# Patient Record
Sex: Male | Born: 1935 | Race: White | Hispanic: No | Marital: Married | Smoking: Current every day smoker
Health system: Southern US, Community
[De-identification: ages and names within clinical notes are randomized; demographics above are authoritative.]

## PROBLEM LIST (undated history)

## (undated) DIAGNOSIS — N186 End stage renal disease: Secondary | ICD-10-CM

## (undated) DIAGNOSIS — I10 Essential (primary) hypertension: Secondary | ICD-10-CM

## (undated) DIAGNOSIS — E785 Hyperlipidemia, unspecified: Secondary | ICD-10-CM

## (undated) DIAGNOSIS — N400 Enlarged prostate without lower urinary tract symptoms: Secondary | ICD-10-CM

## (undated) DIAGNOSIS — J449 Chronic obstructive pulmonary disease, unspecified: Secondary | ICD-10-CM

## (undated) DIAGNOSIS — I5032 Chronic diastolic (congestive) heart failure: Secondary | ICD-10-CM

## (undated) DIAGNOSIS — E119 Type 2 diabetes mellitus without complications: Secondary | ICD-10-CM

## (undated) HISTORY — DX: Chronic obstructive pulmonary disease, unspecified: J44.9

## (undated) HISTORY — PX: CYSTOSCOPY: SUR368

## (undated) HISTORY — DX: Essential (primary) hypertension: I10

## (undated) HISTORY — DX: Chronic diastolic (congestive) heart failure: I50.32

## (undated) HISTORY — PX: KNEE ARTHROPLASTY: SHX992

## (undated) HISTORY — DX: Hyperlipidemia, unspecified: E78.5

## (undated) HISTORY — PX: ABDOMINAL AORTIC ENDOVASCULAR STENT GRAFT: SHX5707

## (undated) HISTORY — DX: Type 2 diabetes mellitus without complications: E11.9

## (undated) HISTORY — DX: Benign prostatic hyperplasia without lower urinary tract symptoms: N40.0

---

## 2010-09-09 DIAGNOSIS — M1990 Unspecified osteoarthritis, unspecified site: Secondary | ICD-10-CM | POA: Insufficient documentation

## 2010-10-07 DIAGNOSIS — I729 Aneurysm of unspecified site: Secondary | ICD-10-CM | POA: Insufficient documentation

## 2011-09-21 DIAGNOSIS — E119 Type 2 diabetes mellitus without complications: Secondary | ICD-10-CM | POA: Insufficient documentation

## 2012-06-12 ENCOUNTER — Inpatient Hospital Stay: Payer: Self-pay | Admitting: Internal Medicine

## 2012-06-12 DIAGNOSIS — I059 Rheumatic mitral valve disease, unspecified: Secondary | ICD-10-CM

## 2012-06-12 LAB — COMPREHENSIVE METABOLIC PANEL
Anion Gap: 3 — ABNORMAL LOW (ref 7–16)
Calcium, Total: 9.9 mg/dL (ref 8.5–10.1)
Co2: 31 mmol/L (ref 21–32)
EGFR (African American): 50 — ABNORMAL LOW
Glucose: 102 mg/dL — ABNORMAL HIGH (ref 65–99)
Sodium: 139 mmol/L (ref 136–145)
Total Protein: 7.1 g/dL (ref 6.4–8.2)

## 2012-06-12 LAB — CBC
HCT: 27.5 % — ABNORMAL LOW (ref 40.0–52.0)
HGB: 8.9 g/dL — ABNORMAL LOW (ref 13.0–18.0)
MCH: 23.8 pg — ABNORMAL LOW (ref 26.0–34.0)
Platelet: 170 10*3/uL (ref 150–440)
RBC: 3.72 10*6/uL — ABNORMAL LOW (ref 4.40–5.90)
RDW: 17.9 % — ABNORMAL HIGH (ref 11.5–14.5)
WBC: 8.2 10*3/uL (ref 3.8–10.6)

## 2012-06-12 LAB — PRO B NATRIURETIC PEPTIDE: B-Type Natriuretic Peptide: 1156 pg/mL — ABNORMAL HIGH (ref 0–125)

## 2012-06-12 LAB — CK TOTAL AND CKMB (NOT AT ARMC): CK, Total: 116 U/L (ref 35–232)

## 2012-06-12 LAB — CK-MB: CK-MB: 2.6 ng/mL (ref 0.5–3.6)

## 2012-06-13 LAB — BASIC METABOLIC PANEL
Anion Gap: 7 (ref 7–16)
BUN: 35 mg/dL — ABNORMAL HIGH (ref 7–18)
Co2: 32 mmol/L (ref 21–32)
Creatinine: 1.58 mg/dL — ABNORMAL HIGH (ref 0.60–1.30)
EGFR (African American): 49 — ABNORMAL LOW
Osmolality: 289 (ref 275–301)
Potassium: 3.4 mmol/L — ABNORMAL LOW (ref 3.5–5.1)

## 2012-06-13 LAB — CBC WITH DIFFERENTIAL/PLATELET
Eosinophil #: 0.2 10*3/uL (ref 0.0–0.7)
Lymphocyte #: 1.2 10*3/uL (ref 1.0–3.6)
MCH: 23.8 pg — ABNORMAL LOW (ref 26.0–34.0)
MCV: 73 fL — ABNORMAL LOW (ref 80–100)
Monocyte #: 0.9 x10 3/mm (ref 0.2–1.0)
Neutrophil %: 72.9 %
Platelet: 175 10*3/uL (ref 150–440)
RBC: 3.91 10*6/uL — ABNORMAL LOW (ref 4.40–5.90)
RDW: 18 % — ABNORMAL HIGH (ref 11.5–14.5)

## 2012-06-14 LAB — IRON AND TIBC: Iron: 31 ug/dL — ABNORMAL LOW (ref 65–175)

## 2012-06-14 LAB — MAGNESIUM: Magnesium: 1.8 mg/dL

## 2012-06-14 LAB — FOLATE: Folic Acid: 8.5 ng/mL (ref 3.1–100.0)

## 2012-06-15 LAB — CBC WITH DIFFERENTIAL/PLATELET
Basophil #: 0 10*3/uL (ref 0.0–0.1)
Basophil %: 0.5 %
Eosinophil %: 1 %
HCT: 28.9 % — ABNORMAL LOW (ref 40.0–52.0)
HGB: 9.5 g/dL — ABNORMAL LOW (ref 13.0–18.0)
Lymphocyte #: 0.7 10*3/uL — ABNORMAL LOW (ref 1.0–3.6)
Lymphocyte %: 8.5 %
MCH: 23.9 pg — ABNORMAL LOW (ref 26.0–34.0)
MCHC: 32.8 g/dL (ref 32.0–36.0)
MCV: 73 fL — ABNORMAL LOW (ref 80–100)
Monocyte #: 0.6 x10 3/mm (ref 0.2–1.0)
Monocyte %: 7.5 %
Neutrophil %: 82.5 %
Platelet: 181 10*3/uL (ref 150–440)

## 2012-06-15 LAB — BASIC METABOLIC PANEL
Anion Gap: 7 (ref 7–16)
Chloride: 97 mmol/L — ABNORMAL LOW (ref 98–107)
Creatinine: 1.92 mg/dL — ABNORMAL HIGH (ref 0.60–1.30)
EGFR (Non-African Amer.): 34 — ABNORMAL LOW
Potassium: 3.5 mmol/L (ref 3.5–5.1)

## 2012-06-16 LAB — BASIC METABOLIC PANEL
Chloride: 95 mmol/L — ABNORMAL LOW (ref 98–107)
Co2: 31 mmol/L (ref 21–32)
Creatinine: 1.99 mg/dL — ABNORMAL HIGH (ref 0.60–1.30)
EGFR (African American): 37 — ABNORMAL LOW
EGFR (Non-African Amer.): 32 — ABNORMAL LOW
Glucose: 179 mg/dL — ABNORMAL HIGH (ref 65–99)
Osmolality: 281 (ref 275–301)

## 2012-06-16 LAB — CBC WITH DIFFERENTIAL/PLATELET
Basophil #: 0.1 10*3/uL (ref 0.0–0.1)
Basophil %: 1 %
Eosinophil #: 0.2 10*3/uL (ref 0.0–0.7)
HGB: 9.8 g/dL — ABNORMAL LOW (ref 13.0–18.0)
Lymphocyte #: 1.3 10*3/uL (ref 1.0–3.6)
Lymphocyte %: 16.8 %
MCH: 23.8 pg — ABNORMAL LOW (ref 26.0–34.0)
MCHC: 32.3 g/dL (ref 32.0–36.0)
MCV: 74 fL — ABNORMAL LOW (ref 80–100)
Monocyte %: 11.4 %
Neutrophil #: 5.1 10*3/uL (ref 1.4–6.5)
Neutrophil %: 67.6 %
Platelet: 187 10*3/uL (ref 150–440)
RBC: 4.12 10*6/uL — ABNORMAL LOW (ref 4.40–5.90)
WBC: 7.6 10*3/uL (ref 3.8–10.6)

## 2012-06-17 LAB — CBC WITH DIFFERENTIAL/PLATELET
Basophil %: 1.5 %
Eosinophil #: 0.3 10*3/uL (ref 0.0–0.7)
HCT: 29.8 % — ABNORMAL LOW (ref 40.0–52.0)
HGB: 9.7 g/dL — ABNORMAL LOW (ref 13.0–18.0)
Lymphocyte #: 1.6 10*3/uL (ref 1.0–3.6)
Lymphocyte %: 23.9 %
MCH: 23.8 pg — ABNORMAL LOW (ref 26.0–34.0)
MCHC: 32.7 g/dL (ref 32.0–36.0)
MCV: 73 fL — ABNORMAL LOW (ref 80–100)
Monocyte #: 0.8 x10 3/mm (ref 0.2–1.0)
Neutrophil #: 3.8 10*3/uL (ref 1.4–6.5)
RDW: 17.8 % — ABNORMAL HIGH (ref 11.5–14.5)
WBC: 6.5 10*3/uL (ref 3.8–10.6)

## 2012-06-17 LAB — BASIC METABOLIC PANEL
Anion Gap: 7 (ref 7–16)
Creatinine: 1.61 mg/dL — ABNORMAL HIGH (ref 0.60–1.30)
Osmolality: 282 (ref 275–301)
Sodium: 136 mmol/L (ref 136–145)

## 2012-06-17 LAB — MAGNESIUM: Magnesium: 2.5 mg/dL — ABNORMAL HIGH

## 2012-06-24 ENCOUNTER — Encounter: Payer: Self-pay | Admitting: *Deleted

## 2012-06-25 ENCOUNTER — Ambulatory Visit (INDEPENDENT_AMBULATORY_CARE_PROVIDER_SITE_OTHER): Payer: Medicare Other | Admitting: Cardiovascular Disease

## 2012-06-25 ENCOUNTER — Encounter: Payer: Self-pay | Admitting: Cardiovascular Disease

## 2012-06-25 VITALS — BP 100/50 | HR 51 | Ht 68.0 in | Wt 210.0 lb

## 2012-06-25 DIAGNOSIS — I739 Peripheral vascular disease, unspecified: Secondary | ICD-10-CM

## 2012-06-25 DIAGNOSIS — I509 Heart failure, unspecified: Secondary | ICD-10-CM

## 2012-06-25 DIAGNOSIS — I10 Essential (primary) hypertension: Secondary | ICD-10-CM

## 2012-06-25 DIAGNOSIS — I5032 Chronic diastolic (congestive) heart failure: Secondary | ICD-10-CM

## 2012-06-25 DIAGNOSIS — R0602 Shortness of breath: Secondary | ICD-10-CM

## 2012-06-25 MED ORDER — AMLODIPINE BESYLATE 10 MG PO TABS: 5.0000 mg | ORAL_TABLET | Freq: Every day | ORAL | Status: DC

## 2012-06-25 MED ORDER — CARVEDILOL 25 MG PO TABS: 12.5000 mg | ORAL_TABLET | Freq: Two times a day (BID) | ORAL | Status: DC

## 2012-06-25 NOTE — Assessment & Plan Note (Signed)
Due to low blood pressure, I will decrease amlodipine to 5 mg once daily.

## 2012-06-25 NOTE — Patient Instructions (Addendum)
Decrease Amlodipine to 5 mg once daily (1/2 tablet).  Decrease Carvedilol to 12.5 mg twice daily (1/2 tablet).   Labs today.  I recommend that you get a Lexiscan nuclear stress test to check for blockages. Let us know when ready to schedule this.   Follow up in 2 months.

## 2012-06-25 NOTE — Assessment & Plan Note (Signed)
The patient appears to be euvolemic. I am concerned about his fatigue and hypotension. He has been taking Lasix 40 mg once daily. I will check basic metabolic profile to make sure he is not volume depleted. He has significant exertional dyspnea and prolonged history of diabetes. Thus, he is at high risk for underlying obstructive coronary artery disease. Also recently his cardiac enzymes were mildly elevated and felt to be due to supply demand ischemia. Due to all of that, I recommend proceeding with a pharmacologic nuclear stress test for evaluation. The patient wants to discuss with his family before scheduling this. In the meantime, I recommend decreasing the dose of carvedilol to 12.5 mg twice daily due to his bradycardia and hypotension.

## 2012-06-25 NOTE — Assessment & Plan Note (Signed)
He reports significant bilateral leg discomfort with walking suggestive of claudication. He follows up at the vascular clinic at [facility]. I recommend obtaining an ABI if not recently done.

## 2012-06-25 NOTE — Progress Notes (Signed)
Primary care physician: Dr. Reather Converse at [facility]  HPI  This is a 77 year old Caucasian male who is here today for a cardiovascular evaluation after recent admission at [facility] for acute diastolic heart failure. He has known history of COPD, hypertension, chronic kidney disease with a creatinine around 1.5, and prolonged history of diabetes. He also reports previous history of abdominal aortic aneurysm status post stent graft placement at [facility] about 3 years ago. He still follows up there and reports bilateral leg claudication. He presented to [facility] on May 7 with shortness of breath. He was noted to be hypoxic on presentation with oxygen saturation of 70% on room air. He was started on BiPAP. Chest x-ray showed congestive heart failure with BNP close to 1000. He diuresed well with Lasix. He also for anxiety and restless leg syndrome with Paxil and Requip. He had an electrocardiogram done which showed normal LV systolic function with grade 1 diastolic dysfunction, mild mitral and tricuspid regurgitation with no evidence of pulmonary hypertension.  His cardiac enzymes were mildly elevated which was felt to be due to supply demand ischemia. Since hospital discharge, he reports fatigue and dizziness. His dyspnea has improved. He denies any chest discomfort. He continues to smoke one pack per day.  No Known Allergies   Current Outpatient Prescriptions on File Prior to Visit  Medication Sig Dispense Refill  . Aspirin-Caffeine (BC FAST PAIN RELIEF ARTHRITIS) 1000-65 MG PACK Take by mouth as needed.      . benazepril (LOTENSIN) 40 MG tablet Take 40 mg by mouth 2 (two) times daily.      . clonazePAM (KLONOPIN) 1 MG tablet Take 1 mg by mouth daily.      Marland Kitchen docusate sodium (COLACE) 100 MG capsule Take 100 mg by mouth 2 (two) times daily.      . furosemide (LASIX) 20 MG tablet Take 40 mg by mouth daily.      Marland Kitchen lovastatin (MEVACOR) 40 MG tablet Take 40 mg by mouth at bedtime.      Marland Kitchen oxyCODONE (OXY IR/ROXICODONE) 5 MG  immediate release tablet Take 5 mg by mouth every 6 (six) hours as needed for pain.      Marland Kitchen PARoxetine (PAXIL-CR) 12.5 MG 24 hr tablet Take 12.5 mg by mouth daily.      . potassium chloride SA (K-DUR,KLOR-CON) 20 MEQ tablet Take 20 mEq by mouth daily.      Marland Kitchen rOPINIRole (REQUIP) 0.25 MG tablet Take 0.25 mg by mouth 3 (three) times daily.      . tamsulosin (FLOMAX) 0.4 MG CAPS Take 0.4 mg by mouth daily.      Marland Kitchen tiotropium (SPIRIVA) 18 MCG inhalation capsule Place 18 mcg into inhaler and inhale daily.       No current facility-administered medications on file prior to visit.     Past Medical History  Diagnosis Date  . Hypertension   . Hyperlipidemia   . Diabetes mellitus without complication   . BPH (benign prostatic hyperplasia)   . COPD (chronic obstructive pulmonary disease)   . Chronic kidney disease     stage III  . Chronic diastolic heart failure      Past Surgical History  Procedure Laterality Date  . Abdominal aortic endovascular stent graft    . Cystoscopy    . Knee arthroplasty       History reviewed. No pertinent family history.   History   Social History  . Marital Status: Married    Spouse Name: N/A  Number of Children: N/A  . Years of Education: N/A   Occupational History  . Not on file.   Social History Main Topics  . Smoking status: Current Every Day Smoker -- 1.00 packs/day for 60 years    Types: Cigarettes  . Smokeless tobacco: Not on file  . Alcohol Use: No  . Drug Use: No  . Sexually Active: Not on file   Other Topics Concern  . Not on file   Social History Narrative  . No narrative on file     ROS  A10 point review of system was performed. It's negative other than as mentioned in history of present illness.  PHYSICAL EXAM   BP 100/50  Pulse 51  Ht 5\' 8"  (1.727 m)  Wt 210 lb (95.255 kg)  BMI 31.94 kg/m2 Constitutional: He is oriented to person, place, and time. He appears well-developed and well-nourished. No distress.    HENT: No nasal discharge.  Head: Normocephalic and atraumatic.  Eyes: Pupils are equal and round. Right eye exhibits no discharge. Left eye exhibits no discharge.  Neck: Normal range of motion. Neck supple. No JVD present. No thyromegaly present.  Cardiovascular: Normal rate, regular rhythm, normal heart sounds and. Exam reveals no gallop and no friction rub. No murmur heard.  Pulmonary/Chest: Effort normal and breath sounds normal. No stridor. No respiratory distress. He has no wheezes. He has no rales. He exhibits no tenderness.  Abdominal: Soft. Bowel sounds are normal. He exhibits no distension. There is no tenderness. There is no rebound and no guarding.  Musculoskeletal: Normal range of motion. He exhibits no edema and no tenderness.  Neurological: He is alert and oriented to person, place, and time. Coordination normal.  Skin: Skin is warm and dry. No rash noted. He is not diaphoretic. No erythema. No pallor.  Psychiatric: He has a normal mood and affect. His behavior is normal. Judgment and thought content normal.       EKG: sinus bradycardia with incomplete left bundle branch block. Possible old inferior infarct.   ASSESSMENT AND PLAN

## 2012-06-27 ENCOUNTER — Other Ambulatory Visit: Payer: Self-pay

## 2012-06-27 MED ORDER — FUROSEMIDE 20 MG PO TABS: 20.0000 mg | ORAL_TABLET | Freq: Every day | ORAL | Status: DC

## 2012-08-26 ENCOUNTER — Ambulatory Visit: Payer: Medicare Other | Admitting: Cardiovascular Disease

## 2012-11-29 DIAGNOSIS — I5042 Chronic combined systolic (congestive) and diastolic (congestive) heart failure: Secondary | ICD-10-CM | POA: Insufficient documentation

## 2014-03-11 DIAGNOSIS — IMO0002 Reserved for concepts with insufficient information to code with codable children: Secondary | ICD-10-CM | POA: Insufficient documentation

## 2014-03-11 DIAGNOSIS — R7989 Other specified abnormal findings of blood chemistry: Secondary | ICD-10-CM | POA: Insufficient documentation

## 2014-03-17 DIAGNOSIS — N183 Chronic kidney disease, stage 3 unspecified: Secondary | ICD-10-CM | POA: Insufficient documentation

## 2014-05-29 NOTE — H&P (Signed)
PATIENT NAME:  Brent Glover, COSTILLA MR#:  [id number] DATE OF BIRTH:  December 19, 1935  DATE OF ADMISSION:  06/12/2012  PRIMARY CARE PHYSICIAN: From [facility].   ER PHYSICIAN: Dr. Jasmine December.   CHIEF COMPLAINT: Shortness of breath.   HISTORY OF PRESENT ILLNESS: The patient is a 79 year old male patient with a history of COPD, hypertension, diabetes, had shortness of breath for awhile. The patient was brought in by the EMS. EMS found the patient had 70% saturations at home. The patient was brought in with BiPAP, and the patient's blood pressure was 146/108 by EMS, and the patient did receive some nitro spray, and he came to the Emergency Room. The patient's blood pressure was around 150/80 and the patient received 40 mg of Lasix.   The patient's chest x-ray showed CHF with elevated BNP of 756. We gave admitted him for CHF exacerbation. The patient says that he has been having shortness of breath for a long time. Got recently worse for 3 days, associated with orthopnea and PND, and the patient also had some cough. The patient's daughter is a Marine scientist at [employer], and history was obtained from the doctor, and the patient used a lot of Klonopin because he thought it was anxiety-related and he started to use a lot of Klonopin, and then after he did not get better EMS was called this morning.   PATIENT'S PAST MEDICAL HISTORY: Is significant for hypertension, diabetes, hyperlipidemia, chronic kidney disease and BPH.   ALLERGIES: No known allergies.   SOCIAL HISTORY: Heavy smoker, has smoked 1 pack per day for almost 60 years. No alcohol. No drugs.   FAMILY HISTORY: No history of colon cancers or hypertension.   MEDICATIONS:  1.  Amlodipine 10 mg p.o. daily.  2.  Arthritis tablets 1000/65 mg 1 packet every 6 hours as needed.  3.  Benazepril 40 mg p.o. b.i.d.  4.  Coreg 25 mg p.o. b.i.d.  5.  Clonazepam 1 mg at bedtime.  6.  Colace 100 mg p.o. b.i.d., p.r.n.  7. Furosemide 20 mg, 2 tablets once a day.  8.  Lovastatin 40 mg once a day.  9.  Metformin 1 gram p.o. b.i.d. 10. Oxycodone 5 mg p.o. every 6 hours as needed for pain.  11.  Flomax 0.4 mg daily.   REVIEW OF SYSTEMS: Right now he is on BiPAP. Unable to give me clear review of systems, but at least he could say that he does not have any chest pain. No abdominal pain. The patient does have trouble breathing when I arrived, with a cough. Denies any dysuria. The patient denies any TIAs or strokes. Denies any depression, but complains of anxiety and also has joint pains.   PHYSICAL EXAM: The patient is an elderly 79 year old male, not in distress. He says he feels much better.   VITAL SIGNS: Patient's heart rate is around 50, blood pressure is 150/80, sats: The patient is on BiPAP at 11/10/48, sats 93%.  HEENT: Head atraumatic, normocephalic. Pupils equally reactive to light. Extraocular movements are intact. Tympanic membranes in normal condition. No thyromegaly, lymphadenopathy. No oropharyngeal erythema.  NECK: Normal range of motion. No JVD. No adenopathy.  CARDIOVASCULAR: S1, S2 regular. No murmurs.  LUNGS: Bilateral coarse breath sounds present, and no wheezing effort.  ABDOMEN: Soft, nontender, nondistended. Bowel sounds present.  EXTREMITIES: The patient does have extremities, 2+ edema  NEUROLOGIC: The patient is alert, awake, oriented. No focal neurological deficit. Cranial nerves II-XII intact. Power 5/5 upper and lower extremities. Sensation is  intact. PT pulses 2+ bilaterally.  PSYCHIATRIC: Mood and affect are within normal limits.   LAB DATA: Chest x-ray shows was bibasilar atelectasis, pulmonary vascular congestion with mild interstitial thickening.   BNP 1156. CK total 116, CPK-MB 2.4. WBC 8.2, hemoglobin 8.9, hematocrit 27.5, platelets 170.   ELECTROLYTES: Sodium 139, potassium 3.9, chloride 105, bicarb 31, BUN 37, creatinine 1.56. Glucose 102. Troponin is 0.03.   Chest x-ray consistent with CHF, as I mentioned.    EKG:  Normal sinus at 60 beats per minute. No ST-T changes.   ASSESSMENT AND PLAN: The patient is a 79 year old male with acute respiratory failure,  secondary to CHF exacerbation. The patient has an elevated BNP with orthopnea and paroxysmal nocturnal dyspnea, consistent congestive heart failure exacerbation. The patient is on BiPAP. Continue the BiPAP, continue IV Lasix 60 mg q.12 h., and monitor daily weights and ins and outs, and follow the echocardiogram. Unknown whether he had acute systolic failure or chronic systolic or diastolic heart failure because we do not have prior echoes.  The patient's other diagnoses include:   1.  Chronic kidney disease, stage III, likely due to diabetic nephropathy: Previous labs are not available to compare, and I decreased the dose of ACE as he is on high-dose Lasix. Will watch the kidney function closely.  2.  History of chronic obstructive pulmonary disease and active tobacco abuse: The patient has no wheezing at this time. We will continue DuoNebs  and Spiriva. Counseled him on smoking cessation for at least 10 minutes. Will hold off on antibiotics for chronic obstructive pulmonary disease.  3.  Hyperlipidemia: Continue statins.  4.  Benign: Prostatic hypertrophy. Continue Flomax.  5.  Diabetes mellitus, type 2: The patient's metformin is on-hold because of renal insufficiency, and could do sliding-scale coverage and obtain hemoglobin A1c.  6.  Mild sinus bradycardia: Decrease the Coreg and monitor on telemetry.   Condition at this time is stable.   Time spent on history and physical: About 55 minutes.    ____________________________ Epifanio Lesches, MD sk:dm D: 06/12/2012 14:08:55 ET T: 06/12/2012 14:20:29 ET JOB#: [id number]  cc: Epifanio Lesches, MD, <Dictator> Epifanio Lesches MD ELECTRONICALLY SIGNED 06/23/2012 17:49

## 2014-05-29 NOTE — Consult Note (Signed)
Brief Consult Note: Diagnosis: major depression, moderate.   Patient was seen by consultant.   Consult note dictated.   Orders entered.   Comments: Psychiatry: Patient seen. Chart reviewed. Note dictated. Patient has multiple symptoms of moderate depression as well as panic attacks which are prob. made worse by his breathing problems. Support and education done and added paxil cr 12.5mg  per day for depression and anxiety.  Electronic Signatures: Gonzella Lex (MD)  (Signed [id number] 20:20)  Authored: Brief Consult Note   Last Updated: 09-May-14 20:20 by Gonzella Lex (MD)

## 2014-05-29 NOTE — Consult Note (Signed)
Psychiatry: Came by to see patient for followup. He appeared more tired today. He was resting when I came in. He reported that he had had a panic attack last night but none during the day to day. No other new side effects. Mood not really changed from yesterday. No sign of acute dangerousness. current medication as recommended. Supportive and educational therapy done.  Electronic Signatures: Kynnedy Carreno, Madie Reno (MD)  (Signed on 10-May-14 23:07)  Authored  Last Updated: 10-May-14 23:07 by Gonzella Lex (MD)

## 2014-05-29 NOTE — Consult Note (Signed)
PATIENT NAME:  Brent Glover, Brent Glover MR#:  [id number] DATE OF BIRTH:  [birth year]  DATE OF CONSULTATION:  06/14/2012  REFERRING PHYSICIAN:   CONSULTING PHYSICIAN:  Gonzella Lex, MD  IDENTIFYING INFORMATION AND REASON FOR CONSULTATION:  The patient is a 80 year old man currently in the hospital for exacerbation of congestive heart failure.  Also with multiple other medical problems.  The patient has anxiety attacks.  Consult for evaluation and management.   HISTORY OF PRESENT ILLNESS:  Information obtained from the patient and the chart.  The patient reports that he first started having panic attacks in 2007 right after he retired.  They were treated with Xanax initially and later with clonazepam.  He was fairly stable for a few years, but they have been getting worse recently.  Most of what he describes are acute episodes of shortness of breath.  It will happen when he gets into bed at night.  He then has to get up and sit up for a while before it eases off.  This sounds like fairly typical symptoms of congestive heart failure to me, but I think they are probably interacting with his history of anxiety attacks in the past.  He still does have anxiety problems during the day as well.  Additionally, he is complaining that his mood stays kind of down and low most of the time.  He is not severely depressed, but he does not enjoy much in life and does not feel like he has a whole lot to look forward to.  His energy level is low and he feels tired a lot of the time.  His sleep is poor, although that is partly related to his breathing problems.  Appetite has not been very good.  He totally denies suicidal ideation and does not report any psychotic symptoms.  He has been taking clonazepam 1 mg at night and admits that recently he has taken 1-1/2 of them at times, although it does not seem like it has made a huge difference to his symptoms.   PAST PSYCHIATRIC HISTORY:  No psychiatric hospitalizations and apparently no  actual treatment by a psychiatrist.  Treatment for his anxiety has been primarily through his primary care doctor.  No history of suicide attempts.  No history of psychotic symptoms.   FAMILY HISTORY:  No identified history of mental illness.   SOCIAL HISTORY:  The patient is retired.  He was a Engineer, manufacturing systems until age 77.  He lives with his wife.  Has two adult daughters who live nearby.  Relationship with his family seems to be generally pretty good.  Nevertheless, the patient describes chronic feelings of disappointment in himself.  He is disappointed that in retirement he has not been able to enjoy himself.  He talks about how he was looking forward to retirement and had a lot of things planned that he was never able to do.  Some of that is because of his health, but some of that seems to be that he is just no longer interested in doing much of anything.   PAST MEDICAL HISTORY:  Congestive heart failure, chronic obstructive lung disease, hyperlipidemia, prostate disease, diabetes.   REVIEW OF SYSTEMS:  As noted above, he has anxiety attacks, mostly which consist of shortness of breath described as a rising feeling through his body that will happen when he gets in bed and lies down.  He still has some anxiety during the day, but the anxiety attacks are not as frequent at that time.  Additionally, depressed mood, although not tearfulness, not hopelessness.  Fatigue.  Lack of interest and motivation.  Decreased energy.  No psychosis.  No suicidal ideation.   MENTAL STATUS EXAMINATION:  Somewhat disheveled-looking gentleman, interviewed in his hospital room.  He was cooperative and polite during the interview.  Psychomotor activity quite limited.  Eye contact minimal.  Affect sort of flat.  He smiled a couple of times, but not a lot.  Not tearful, either.  Mood stated as being okay, but nervous.  Thoughts are lucid.  No evidence of loosening of associations or delusions.  No evidence of paranoia.  Denies  hallucinations.  Denies suicidal or homicidal ideation.  Judgment and insight are reasonable.  Intelligence normal.  Does not appear to be demented, although I did not do any formal cognitive testing with him.   CURRENT MEDICATIONS:  Psychiatrically all he is taking is clonazepam 1 mg at night.  He says he has never been tried on anything else other than the clonazepam.   ALLERGIES:  No known drug allergies.   ASSESSMENT:  This is a 79 year old man who to my assessment appears to have a moderate degree of depression present as well as these anxiety attacks.  The anxiety attacks themselves are related to his shortness of breath from his disease which makes it a little harder to know how much they are panic attacks and how much they are his medical problems, but still he does seem to have a chronic and recurrent anxiety disorder.  He would certainly benefit from more treatment I think of his depression and anxiety.   TREATMENT PLAN:  Supportive and educational therapy around depression, anxiety done.  I would not stop the clonazepam.  I did tell him that more than 2 mg of clonazepam at a time even at night is probably not recommended and so the 1 mg at night is probably where he should stop.  Higher doses are not likely to make him feel better anyway.  If anything they are just going to probably make his breathing harder.  I would like to add Paxil 12.5 mg of the extended release once a day.  If this is tolerated it can be increased to 25 mg after a week or so.  This would be a good treatment to start with for depression and anxiety.  The patient is agreeable.  I will check up on him over the weekend.   DIAGNOSIS, PRINCIPAL AND PRIMARY:  AXIS I:  Major depression, moderate in severity.   SECONDARY DIAGNOSES: AXIS I:  Panic attacks without agoraphobia.  AXIS II:  No diagnosis.  AXIS III:  Congestive heart failure, chronic obstructive pulmonary disease, diabetes, hypertension.  AXIS IV:  Moderate,  chronic stress from the limitations his illness placed on him.  AXIS V:  Functioning at time of evaluation 86.      ____________________________ Gonzella Lex, MD jtc:ea D: 06/14/2012 20:28:21 ET T: 06/15/2012 06:44:58 ET JOB#: [id number]  cc: Gonzella Lex, MD, <Dictator> Gonzella Lex MD ELECTRONICALLY SIGNED 06/16/2012 21:51

## 2014-05-29 NOTE — Discharge Summary (Signed)
PATIENT NAME:  Brent Glover, CRONAN MR#:  [id number] DATE OF BIRTH:  02-17-1935  DATE OF ADMISSION:  06/12/2012 DATE OF DISCHARGE:  06/17/2012  DISCHARGE DIAGNOSES: 1.  Acute respiratory failure likely due to congestive heart failure/chronic obstructive pulmonary exacerbation with echocardiogram showing normal left ventricular function, ejection fraction of 55% to 60% due to acute on chronic diastolic heart failure, well compensated on discharge. Resume low-dose Lasix on discharge. 2.  Elevated troponin, likely due to supply-demand ischemia.  3.  Restless legs syndrome and panic attacks, improving on Requip and Paxil.  4.  Constipation, improved on bowel regimen. 5.  Acute on chronic kidney disease, stage III, with a baseline creatinine of 1.5-1.6 likely due to diabetic nephropathy. 6.  Hypokalemia, repleted and resolved.  7.  Anxiety, on Klonopin.  8.  Hypoxia, likely due to chronic obstructive pulmonary disease/congestive heart failure. Will require 1 to 2 liters oxygen by nasal cannula for now.   SECONDARY DIAGNOSES: 1.  Hypertension.  2.  Diabetes.  3.  Hyperlipidemia. 4.  Chronic kidney disease. 5.  Benign prostatic hypertrophy.   CONSULTATION:  Psychiatry, Dr. Weber Cooks.   PROCEDURE, RADIOLOGY:  Chest x-ray on 7th of May showed pulmonary vascular congestion with mild interstitial thickening.   Chest x-ray on 12th of May showed improvement in the appearance of the interstitium in the lungs.   A 2-D echocardiogram on the 7th of May showed normal LV systolic function with EF of 55% to 60%. Mild mitral and tricuspid regurgitation. Normal RV size and systolic function. Mild LVH. Mildly dilated left atrium.  Serum vitamin B12 level was within normal value with a number of 426.  HISTORY AND SHORT HOSPITAL COURSE: The patient is a 79 year old male with the above mentioned medical problem who was admitted for acute on chronic respiratory failure thought to be secondary to CHF and/or COPD  exacerbation. Please see Dr. Governor Specking dictated history and physical for further details. 2-D echocardiogram was performed, which showed normal LV systolic function, with some diastolic dysfunction. The patient also had elevated troponin which is thought to be due to supply-demand ischemia, and psychiatric consultation was obtained from seeing his anxiety and panic attacks. Dr. Weber Cooks started him on Paxil and patient was also started on Requip due to his restless legs syndrome. He had significant improvement on those medications. On 12th of May, he was close to his baseline and was discharged home in stable condition.  On the date of discharge, his vital signs were as follows:  Temperature 97.8, heart rate 58, pulmonary respiration 18, pulmonary blood pressure 108/61 mmHg.  He was saturating 92% 2 liter oxygen by nasal cannula.  His oxygen saturation did drop to 86% on room air for which he was placed on 2 liters oxygen by nasal cannula.   PERTINENT PHYSICAL EXAMINATION ON THE DATE OF DISCHARGE: CARDIOVASCULAR:  S1, S2 normal. No murmurs, rubs or gallop.  LUNGS:  Clear to auscultation bilaterally. No wheezing, rales. No crepitation.  ABDOMEN:  Soft, benign.  NEUROLOGIC:  Nonfocal examination.  All other physical examination remained at baseline.  DISCHARGE MEDICATIONS: 1.  BC powder every 6 hours as needed.  2.  Amlodipine 10 mg p.o. daily.  3.  Oxycodone 5 mg p.o. every 6 hours as needed. 4.  Clonazepam 1 mg p.o. at bedtime.  5.  Lasix 20 mg 2 tablets p.o. daily. 6.  Colace 100 mg p.o. b.i.d.  7.  Lovastatin 40 mg p.o. daily. 8.  Benazepril 40 mg p.o. b.i.d.  9.  Tamsulosin 0.4  mg p.o. daily.  10.  Paroxetine 12.5 mg p.o. daily. 11.  Carvedilol 3.125 mg p.o. b.i.d.  12.  Spiriva once daily. 13.  Aspirin 81 mg p.o. daily. 14.  Ropinirole 0.25 mg p.o. 2 times a day.  15.  Potassium chloride 20 mEq p.o. daily.   DISCHARGE DIET:  Low sodium, low fat, low cholesterol.   DISCHARGE  ACTIVITY:  As tolerated.  DISCHARGE INSTRUCTIONS AND FOLLOWUP: The patient was instructed to follow up with his primary care physician at [facility] in 1 to 2 weeks. He will need follow-up with [facility] in 2 to 4 weeks.  He was set up to get home health nurse by care management. He will need oxygen 1 to 2 liters by nasal cannula continuous for now along with portable tin due to his hypoxia.  TOTAL TIME DISCHARGING THIS PATIENT:  55 minutes   ____________________________ Zenas Santa S. Manuella Ghazi, MD vss:ce D: 06/21/2012 23:43:00 ET T: 06/22/2012 08:15:55 ET JOB#: [id number]  cc: Primary Care Physician [facility] Gonzella Lex, MD Hensley Aziz S. Manuella Ghazi, MD, <Dictator>      Lucina Mellow [facility] MD ELECTRONICALLY SIGNED 06/24/2012 8:55

## 2014-10-15 DIAGNOSIS — I214 Non-ST elevation (NSTEMI) myocardial infarction: Secondary | ICD-10-CM | POA: Insufficient documentation

## 2014-10-28 DIAGNOSIS — Z951 Presence of aortocoronary bypass graft: Secondary | ICD-10-CM | POA: Insufficient documentation

## 2014-11-23 DIAGNOSIS — I2510 Atherosclerotic heart disease of native coronary artery without angina pectoris: Secondary | ICD-10-CM | POA: Insufficient documentation

## 2015-01-06 ENCOUNTER — Ambulatory Visit: Payer: Self-pay

## 2015-02-10 ENCOUNTER — Encounter: Payer: Medicare Other | Attending: Cardiothoracic Surgery | Admitting: *Deleted

## 2015-02-10 ENCOUNTER — Encounter: Payer: Self-pay | Admitting: *Deleted

## 2015-02-10 VITALS — Ht 67.75 in | Wt 199.0 lb

## 2015-02-10 DIAGNOSIS — Z951 Presence of aortocoronary bypass graft: Secondary | ICD-10-CM | POA: Diagnosis not present

## 2015-02-10 NOTE — Progress Notes (Signed)
Cardiac Individual Treatment Plan  Patient Details  Name: Brent Glover MRN: [id number] Date of Birth: 07-05-1935 Referring Provider:  Gustavo Lah, MD  Initial Encounter Date: Date: 02/10/15  Visit Diagnosis: S/P CABG x 4  Patient's Home Medications on Admission:  Current outpatient prescriptions:  .  amLODipine (NORVASC) 10 MG tablet, Take 0.5 tablets (5 mg total) by mouth daily., Disp: , Rfl:  .  aspirin 81 MG tablet, Take 81 mg by mouth daily., Disp: , Rfl:  .  carvedilol (COREG) 25 MG tablet, Take 0.5 tablets (12.5 mg total) by mouth 2 (two) times daily with a meal., Disp: , Rfl:  .  escitalopram (LEXAPRO) 10 MG tablet, Take 20 mg by mouth., Disp: , Rfl:  .  furosemide (LASIX) 20 MG tablet, Take 1 tablet (20 mg total) by mouth daily. (Patient taking differently: Take 20 mg by mouth every other day. ), Disp: 30 tablet, Rfl: 3 .  lisinopril (PRINIVIL,ZESTRIL) 5 MG tablet, Take 2.5 mg by mouth., Disp: , Rfl:  .  lovastatin (MEVACOR) 40 MG tablet, Take 40 mg by mouth at bedtime., Disp: , Rfl:  .  senna (SENOKOT) 8.6 MG TABS tablet, Take by mouth., Disp: , Rfl:  .  tamsulosin (FLOMAX) 0.4 MG CAPS, Take 0.4 mg by mouth daily., Disp: , Rfl:  .  Aspirin-Caffeine (BC FAST PAIN RELIEF ARTHRITIS) 1000-65 MG PACK, Take by mouth as needed. Reported on 02/10/2015, Disp: , Rfl:  .  benazepril (LOTENSIN) 40 MG tablet, Take 40 mg by mouth 2 (two) times daily. Reported on 02/10/2015, Disp: , Rfl:  .  clonazePAM (KLONOPIN) 1 MG tablet, Take 1 mg by mouth daily. Reported on 02/10/2015, Disp: , Rfl:  .  docusate sodium (COLACE) 100 MG capsule, Take 100 mg by mouth 2 (two) times daily. Reported on 02/10/2015, Disp: , Rfl:  .  metFORMIN (GLUCOPHAGE) 1000 MG tablet, Take 1,000 mg by mouth 2 (two) times daily with a meal. Reported on 02/10/2015, Disp: , Rfl:  .  oxyCODONE (OXY IR/ROXICODONE) 5 MG immediate release tablet, Take 5 mg by mouth every 6 (six) hours as needed for pain. Reported on 02/10/2015, Disp: ,  Rfl:  .  PARoxetine (PAXIL-CR) 12.5 MG 24 hr tablet, Take 12.5 mg by mouth daily. Reported on 02/10/2015, Disp: , Rfl:  .  rOPINIRole (REQUIP) 0.25 MG tablet, Take 0.25 mg by mouth 3 (three) times daily. Reported on 02/10/2015, Disp: , Rfl:  .  tiotropium (SPIRIVA) 18 MCG inhalation capsule, Place 18 mcg into inhaler and inhale daily. Reported on 02/10/2015, Disp: , Rfl:   Past Medical History: Past Medical History  Diagnosis Date  . Hyperlipidemia   . Diabetes mellitus without complication ([place])   . BPH (benign prostatic hyperplasia)   . COPD (chronic obstructive pulmonary disease) ([place])   . Chronic kidney disease     stage III  . Chronic diastolic heart failure ([place])   . Hypertension     Tobacco Use: History  Smoking status  . Former Smoker -- 1.00 packs/day for 60 years  . Types: Cigarettes  . Quit date: 10/11/2014  Smokeless tobacco  . Not on file    Labs: Recent Review Flowsheet Data    There is no flowsheet data to display.       Exercise Target Goals: Date: 02/10/15  Exercise Program Goal: Individual exercise prescription set with THRR, safety & activity barriers. Participant demonstrates ability to understand and report RPE using BORG scale, to self-measure pulse accurately, and to acknowledge the importance  of the exercise prescription.  Exercise Prescription Goal: Starting with aerobic activity 30 plus minutes a day, 3 days per week for initial exercise prescription. Provide home exercise prescription and guidelines that participant acknowledges understanding prior to discharge.  Activity Barriers & Risk Stratification:     Activity Barriers & Risk Stratification - 02/10/15 1514    Activity Barriers & Risk Stratification   Activity Barriers Other (comment)   Comments Left knee problems/pain - States "knee is worn out."  Rest and sitting helps pain.  Lower back problems/pain especially with walking long distances.     Risk Stratification High      6 Minute  Walk:     6 Minute Walk      02/10/15 1514       6 Minute Walk   Phase Initial     Distance 1130 feet     Walk Time 6 minutes     Resting HR 67 bpm     Resting BP 146/70 mmHg     Max Ex. HR 104 bpm     Max Ex. BP 178/70 mmHg     RPE 13     Symptoms Yes (comment)     Comments SOB, multifocal PVCs        Initial Exercise Prescription:     Initial Exercise Prescription - 02/10/15 1500    Date of Initial Exercise Prescription   Date 02/10/15   Treadmill   MPH 1.8   Grade 0   Minutes 5  Use 5 minute intervals, can do multiple sets   Bike   Level 0.2   Watts 12   Minutes 10   Recumbant Bike   Level 3   RPM 40   Watts 25   Minutes 15   NuStep   Level 2   Watts 25   Minutes 15   Arm Ergometer   Level 1   Watts 8   Minutes 10   Arm/Foot Ergometer   Level 4   Watts 12   Minutes 10   Cybex   Level 2   RPM 50   Minutes 10   Recumbant Elliptical   Level 1   RPM 40   Watts 10   Minutes 10   REL-XR   Level 2   Watts 30   Minutes 15   T5 Nustep   Level 2   Watts 15   Minutes 15   Biostep-RELP   Level 2   Watts 15   Minutes 15   Prescription Details   Frequency (times per week) 3   Duration Progress to 30 minutes of continuous aerobic without signs/symptoms of physical distress   Intensity   THRR REST +  30   Ratings of Perceived Exertion 11-15   Progression Continue progressive overload as per policy without signs/symptoms or physical distress.   Resistance Training   Training Prescription Yes   Weight 2   Reps 10-15      Exercise Prescription Changes:   Discharge Exercise Prescription (Final Exercise Prescription Changes):   Nutrition:  Target Goals: Understanding of nutrition guidelines, daily intake of sodium 1500mg , cholesterol 200mg , calories 30% from fat and 7% or less from saturated fats, daily to have 5 or more servings of fruits and vegetables.  Biometrics:      Post Biometrics - 02/10/15 1501     Post  Biometrics    Height 5' 7.75" (1.721 m)   Weight 199 lb (90.266 kg)   Waist Circumference 42.25 inches  Hip Circumference 44.5 inches   Waist to Hip Ratio 0.95 %   BMI (Calculated) 30.5      Nutrition Therapy Plan and Nutrition Goals:   Nutrition Discharge: Rate Your Plate Scores:   Nutrition Goals Re-Evaluation:   Psychosocial: Target Goals: Acknowledge presence or absence of depression, maximize coping skills, provide positive support system. Participant is able to verbalize types and ability to use techniques and skills needed for reducing stress and depression.  Initial Review & Psychosocial Screening:     Initial Psych Review & Screening - 02/10/15 1526    Initial Review   Current issues with --  History of Anxiety/Panic Attacks from 2008 to fall 2016.  Panic attacks resolved after CABG and since started on Lexapro.     Family Dynamics   Good Support System? Yes   Comments WIfe and 73 year old grandson who moved in with Mr. Miro and his wife after grandson graduated from college.     Barriers   Psychosocial barriers to participate in program There are no identifiable barriers or psychosocial needs.   Screening Interventions   Interventions Encouraged to exercise;Program counselor consult      Quality of Life Scores:     Quality of Life - 02/10/15 1540    Quality of Life Scores   Health/Function Pre 25.07 %   Socioeconomic Pre 30 %   Psych/Spiritual Pre 29.14 %   Family Pre 28.8 %   GLOBAL Pre 27.55 %      PHQ-9:     Recent Review Flowsheet Data    Depression screen [facility] 2/9 02/10/2015   Decreased Interest 0   Down, Depressed, Hopeless 0   PHQ - 2 Score 0   Altered sleeping 3   Tired, decreased energy 2   Change in appetite 1   Feeling bad or failure about yourself  0   Trouble concentrating 0   Moving slowly or fidgety/restless 0   Suicidal thoughts 0   PHQ-9 Score 6   Difficult doing work/chores Not difficult at all      Psychosocial Evaluation and  Intervention:   Psychosocial Re-Evaluation:   Vocational Rehabilitation: Provide vocational rehab assistance to qualifying candidates.   Vocational Rehab Evaluation & Intervention:     Vocational Rehab - 02/10/15 1518    Initial Vocational Rehab Evaluation & Intervention   Assessment shows need for Vocational Rehabilitation No      Education: Education Goals: Education classes will be provided on a weekly basis, covering required topics. Participant will state understanding/return demonstration of topics presented.  Learning Barriers/Preferences:     Learning Barriers/Preferences - 02/10/15 1517    Learning Barriers/Preferences   Learning Barriers Hearing;Exercise Concerns   Learning Preferences Written Material      Education Topics: General Nutrition Guidelines/Fats and Fiber: -Group instruction provided by verbal, written material, models and posters to present the general guidelines for heart healthy nutrition. Gives an explanation and review of dietary fats and fiber.   Controlling Sodium/Reading Food Labels: -Group verbal and written material supporting the discussion of sodium use in heart healthy nutrition. Review and explanation with models, verbal and written materials for utilization of the food label.   Exercise Physiology & Risk Factors: - Group verbal and written instruction with models to review the exercise physiology of the cardiovascular system and associated critical values. Details cardiovascular disease risk factors and the goals associated with each risk factor.   Aerobic Exercise & Resistance Training: - Gives group verbal and written discussion on the  health impact of inactivity. On the components of aerobic and resistive training programs and the benefits of this training and how to safely progress through these programs.   Flexibility, Balance, General Exercise Guidelines: - Provides group verbal and written instruction on the benefits of  flexibility and balance training programs. Provides general exercise guidelines with specific guidelines to those with heart or lung disease. Demonstration and skill practice provided.   Stress Management: - Provides group verbal and written instruction about the health risks of elevated stress, cause of high stress, and healthy ways to reduce stress.   Depression: - Provides group verbal and written instruction on the correlation between heart/lung disease and depressed mood, treatment options, and the stigmas associated with seeking treatment.   Anatomy & Physiology of the Heart: - Group verbal and written instruction and models provide basic cardiac anatomy and physiology, with the coronary electrical and arterial systems. Review of: AMI, Angina, Valve disease, Heart Failure, Cardiac Arrhythmia, Pacemakers, and the ICD.   Cardiac Procedures: - Group verbal and written instruction and models to describe the testing methods done to diagnose heart disease. Reviews the outcomes of the test results. Describes the treatment choices: Medical Management, Angioplasty, or Coronary Bypass Surgery.   Cardiac Medications: - Group verbal and written instruction to review commonly prescribed medications for heart disease. Reviews the medication, class of the drug, and side effects. Includes the steps to properly store meds and maintain the prescription regimen.   Go Sex-Intimacy & Heart Disease, Get SMART - Goal Setting: - Group verbal and written instruction through game format to discuss heart disease and the return to sexual intimacy. Provides group verbal and written material to discuss and apply goal setting through the application of the S.M.A.R.T. Method.   Other Matters of the Heart: - Provides group verbal, written materials and models to describe Heart Failure, Angina, Valve Disease, and Diabetes in the realm of heart disease. Includes description of the disease process and treatment  options available to the cardiac patient.   Exercise & Equipment Safety: - Individual verbal instruction and demonstration of equipment use and safety with use of the equipment.          Cardiac Rehab from 02/10/2015 in [facility]iac [facility]   Date  02/10/15   Educator  DW   Instruction Review Code  1- partially meets, needs review/practice      Infection Prevention: - Provides verbal and written material to individual with discussion of infection control including proper hand washing and proper equipment cleaning during exercise session.      Cardiac Rehab from 02/10/2015 in [facility] Cardiac and Pulmonary Rehab   Date  02/10/15   Educator  DW   Instruction Review Code  2- meets goals/outcomes      Falls Prevention: - Provides verbal and written material to individual with discussion of falls prevention and safety.      Cardiac Rehab from 02/10/2015 in [facility] Cardiac and Pulmonary Rehab   Date  02/10/15   Educator  DW   Instruction Review Code  2- meets goals/outcomes      Diabetes: - Individual verbal and written instruction to review signs/symptoms of diabetes, desired ranges of glucose level fasting, after meals and with exercise. Advice that pre and post exercise glucose checks will be done for 3 sessions at entry of program.      Cardiac Rehab from 02/10/2015 in [facility] Cardiac and Pulmonary Rehab   Date  02/10/15   Educator  DW  Instruction Review Code  2- meets goals/outcomes       Knowledge Questionnaire Score:     Knowledge Questionnaire Score - 02/10/15 1541    Knowledge Questionnaire Score   Pre Score 22/28      Personal Goals and Risk Factors at Admission:     Personal Goals and Risk Factors at Admission - 02/10/15 1521    Personal Goals and Risk Factors on Admission   Increase Aerobic Exercise and Physical Activity Yes   Intervention While in program, learn and follow the exercise prescription taught. Start at a low level workload and increase workload  after able to maintain previous level for 30 minutes. Increase time before increasing intensity.   Take Less Medication Yes   Intervention Learn your risk factors and begin the lifestyle modifications for risk factor control during your time in the program.   Understand more about Heart/Pulmonary Disease. Yes   Intervention While in program utilize professionals for any questions, and attend the education sessions. Great websites to use are www.americanheart.org or www.lung.org for reliable information.   Diabetes Yes   Goal Blood glucose control identified by blood glucose values, HgbA1C. Participant verbalizes understanding of the signs/symptoms of hyper/hypo glycemia, proper foot care and importance of medication and nutrition plan for blood glucose control.   Intervention Provide nutrition & aerobic exercise along with prescribed medications to achieve blood glucose in normal ranges: Fasting 65-99 mg/dL   Hypertension Yes   Goal Participant will see blood pressure controlled within the values of 140/12mm/Hg or within value directed by their physician.   Intervention Provide nutrition & aerobic exercise along with prescribed medications to achieve BP 140/90 or less.   Lipids Yes   Goal Cholesterol controlled with medications as prescribed, with individualized exercise RX and with personalized nutrition plan. Value goals: LDL < 70mg , HDL > 40mg . Participant states understanding of desired cholesterol values and following prescriptions.   Intervention Provide nutrition & aerobic exercise along with prescribed medications to achieve LDL 70mg , HDL >40mg .      Personal Goals and Risk Factors Review:    Personal Goals Discharge (Final Personal Goals and Risk Factors Review):    ITP Comments & Comments:   Upon arrival to Cardiac Rehab today patient informed RN that he had been wearing a Life Vest from the time he was discharged from the hospital in December until yesterday when he turned the  [employer] into the company.  He stated he did not think he needed it, as it never "fired" or "went off."  The patient stated he could no longer afford the Life Vest as the monthly charge for the vest was $3225 and his payment each month after insurance was $540.  He stated he is two months behind on the payment.  He further stated he was going to see Dr. Ron Parker on Monday, February 15, 2015 at which time Dr. Ron Parker was going to tell him if he needed a defibrillator or not. Patient also informed me that MD is not aware that he no longer is wearing the Life Vest.   VSS.  Initial rhythm was sinus rhythm with occasional PVC.  As patient was finishing the 6 minute walk patient became SOB and ectopy increased.  Pt had frequent multifocal PVCs.  Max HR during walk was 104.  (Resting HR before starting walk was 62 - 67.)  After walk as heart rate decreased back to baseline SOB and ectopy decreased.  RN attempted to reach Dr. Ron Parker and or Dr.  Katz's nurse via telephone. Message left for RN on recorder.   Upon calling Dr. Kae Heller office RN was informed pt does not have an appointment with Dr. Ron Parker on January 9th and the earliest possible appointment with Dr. Ron Parker was March 2017.  Appointment made with Judson Roch, NP in [facility] for January 12th at 9:45 a.m. Copies of ECG monitoring strips faxed to Dr. Kae Heller office and a copy given to patient.  Patient will not start cardiac rehab until we hear back from the doctor regarding need for defibrillator.  Patient is tentatively scheduled to start Cardiac Rehab on January 16th at 3:45 p.m.    Awaiting response from Dr. Kae Heller office regarding rhythm strips from today's orientation session.

## 2015-02-10 NOTE — Progress Notes (Deleted)
Daily Session Note

## 2015-02-10 NOTE — Patient Instructions (Addendum)
Patient Instructions  Patient Details  Name: Brent Glover MRN: [id number] Date of Birth: 11-15-1935 Referring Provider:  Gustavo Lah, MD  Below are the personal goals you chose as well as exercise and nutrition goals. Our goal is to help you keep on track towards obtaining and maintaining your goals. We will be discussing your progress on these goals with you throughout the program.  Initial Exercise Prescription:     Initial Exercise Prescription - 02/10/15 1500    Date of Initial Exercise Prescription   Date 02/10/15   Treadmill   MPH 1.8   Grade 0   Minutes 5  Use 5 minute intervals, can do multiple sets   Bike   Level 0.2   Watts 12   Minutes 10   Recumbant Bike   Level 3   RPM 40   Watts 25   Minutes 15   NuStep   Level 2   Watts 25   Minutes 15   Arm Ergometer   Level 1   Watts 8   Minutes 10   Arm/Foot Ergometer   Level 4   Watts 12   Minutes 10   Cybex   Level 2   RPM 50   Minutes 10   Recumbant Elliptical   Level 1   RPM 40   Watts 10   Minutes 10   REL-XR   Level 2   Watts 30   Minutes 15   T5 Nustep   Level 2   Watts 15   Minutes 15   Biostep-RELP   Level 2   Watts 15   Minutes 15   Prescription Details   Frequency (times per week) 3   Duration Progress to 30 minutes of continuous aerobic without signs/symptoms of physical distress   Intensity   THRR REST +  30   Ratings of Perceived Exertion 11-15   Progression Continue progressive overload as per policy without signs/symptoms or physical distress.   Resistance Training   Training Prescription Yes   Weight 2   Reps 10-15      Exercise Goals: Frequency: Be able to perform aerobic exercise three times per week working toward 3-5 days per week.  Intensity: Work with a perceived exertion of 11 (fairly light) - 15 (hard) as tolerated. Follow your new exercise prescription and watch for changes in prescription as you progress with the program. Changes will be reviewed with  you when they are made.  Duration: You should be able to do 30 minutes of continuous aerobic exercise in addition to a 5 minute warm-up and a 5 minute cool-down routine.  Nutrition Goals: Your personal nutrition goals will be established when you do your nutrition analysis with the dietician.  The following are nutrition guidelines to follow: Cholesterol < 200mg /day Sodium < 1500mg /day Fiber: Men over 50 yrs - 30 grams per day  Personal Goals:     Personal Goals and Risk Factors at Admission - 02/10/15 1521    Personal Goals and Risk Factors on Admission   Increase Aerobic Exercise and Physical Activity Yes   Intervention While in program, learn and follow the exercise prescription taught. Start at a low level workload and increase workload after able to maintain previous level for 30 minutes. Increase time before increasing intensity.   Take Less Medication Yes   Intervention Learn your risk factors and begin the lifestyle modifications for risk factor control during your time in the program.   Understand more about Heart/Pulmonary Disease. Yes  Intervention While in program utilize professionals for any questions, and attend the education sessions. Great websites to use are www.americanheart.org or www.lung.org for reliable information.   Diabetes Yes   Goal Blood glucose control identified by blood glucose values, HgbA1C. Participant verbalizes understanding of the signs/symptoms of hyper/hypo glycemia, proper foot care and importance of medication and nutrition plan for blood glucose control.   Intervention Provide nutrition & aerobic exercise along with prescribed medications to achieve blood glucose in normal ranges: Fasting 65-99 mg/dL   Hypertension Yes   Goal Participant will see blood pressure controlled within the values of 140/93mm/Hg or within value directed by their physician.   Intervention Provide nutrition & aerobic exercise along with prescribed medications to achieve  BP 140/90 or less.   Lipids Yes   Goal Cholesterol controlled with medications as prescribed, with individualized exercise RX and with personalized nutrition plan. Value goals: LDL < 70mg , HDL > 40mg . Participant states understanding of desired cholesterol values and following prescriptions.   Intervention Provide nutrition & aerobic exercise along with prescribed medications to achieve LDL 70mg , HDL >40mg .      Tobacco Use Initial Evaluation: History  Smoking status  . Former Smoker -- 1.00 packs/day for 60 years  . Types: Cigarettes  . Quit date: 10/11/2014  Smokeless tobacco  . Not on file    Copy of goals given to participant.

## 2015-02-10 NOTE — Addendum Note (Signed)
Addended by: Felipe Drone on: 02/10/2015 06:33 PM   Modules accepted: Orders

## 2015-02-10 NOTE — Progress Notes (Signed)
Daily Session Note  Patient Details  Name: Brent Glover MRN: [id number] Date of Birth: 31-Jan-1936 Referring Provider:  Gustavo Lah, MD  Encounter Date: 02/10/2015  Check In:      Goals Met:  Personal goals reviewed  Goals Unmet:  Not Applicable  Goals Comments:    Dr. Emily Filbert is Medical Director for [facility].

## 2015-02-11 ENCOUNTER — Telehealth: Payer: Self-pay | Admitting: *Deleted

## 2015-02-11 NOTE — Telephone Encounter (Signed)
Pam, RN from [facility] at [facility] (Dr. Kae Heller office) returned my call from yesterday regarding patient Brent Glover.  Pam stated patient had an echo end of December which revealed his EF is now 45-50%.  Therefore, patient no longer needs a Life Vest.  Pam, RN reported reviewing the rhythm strips Cardiac Rehab RN faxed from patient's orientation / 6 minute walk yesterday.  Judson Roch, NP is aware of the ectopy on rhythm strips and will draw labs when the patient sees her on January 12th, 2017.  Patient will not start Cardiac Rehab until he has been seen by Judson Roch, NP in the [facility] at [facility]. Patient is scheduled to start Pulmonary Rehab on 02/22/15.

## 2015-02-11 NOTE — Telephone Encounter (Signed)
Called Ja to inform him I had heard from Dr. Kae Heller nurse - Jeannene Patella, RN, who informed me that based on his recent echo who no longer needs the Life Vest.  Patient's EF was 45-50%. Patient also informed that the NP who patient will be seeing on January 12th is aware of the abnormal beats he had during his initial orientation/6 minute walk.   Patient informed Judson Roch, NP is planning to draw some lab work to check his electrolytes when he comes to clinic.  Patient appreciated the call, was thankful for the good news about not needing to wear the Life Vest, and verbalized understanding of the plan going forward.  When patient goes for NP visit he will ask about starting Cardiac Rehab on Monday, February 22, 2015.

## 2015-02-22 ENCOUNTER — Encounter: Payer: Self-pay | Admitting: *Deleted

## 2015-02-22 ENCOUNTER — Ambulatory Visit: Payer: Medicare Other

## 2015-02-24 ENCOUNTER — Ambulatory Visit: Payer: Medicare Other

## 2015-02-25 ENCOUNTER — Telehealth: Payer: Self-pay | Admitting: *Deleted

## 2015-02-25 ENCOUNTER — Encounter: Payer: Self-pay | Admitting: *Deleted

## 2015-02-25 ENCOUNTER — Ambulatory Visit: Payer: Medicare Other

## 2015-02-25 NOTE — Telephone Encounter (Signed)
RN contacted patient to check on patient, as patient was scheduled to start Cardiac Rehab program on 02/21/2014.  Patient did not come to that appointment.  Spoke to patient on the telephone.  Patient stated he was going to come on Monday, but his left knee hurt him so bad that he could not stand it.  Patient also stated, "I think I'm going to need a left knee replacement.  I just cannot exercise right now.  My left knee hurts so bad and particularly at night.  I am going to see my primary care physician today.  Please call me back in a couple of weeks to see if I am any better."  Patient is on hold for cardiac rehab at this time.

## 2015-03-01 ENCOUNTER — Ambulatory Visit: Payer: Medicare Other

## 2015-03-03 ENCOUNTER — Ambulatory Visit: Payer: Medicare Other

## 2015-03-04 ENCOUNTER — Ambulatory Visit: Payer: Medicare Other

## 2015-03-04 NOTE — Progress Notes (Signed)
Cardiac Individual Treatment Plan  Patient Details  Name: Brent Glover MRN: [id number] Date of Birth: February 10, 1935 Referring Provider:  Gustavo Lah, MD  Initial Encounter Date: Date: 02/10/15  Visit Diagnosis: S/P CABG x 4 - Plan: CARDIAC REHAB 30 DAY REVIEW  Patient's Home Medications on Admission:  Current outpatient prescriptions:  .  amLODipine (NORVASC) 10 MG tablet, Take 0.5 tablets (5 mg total) by mouth daily., Disp: , Rfl:  .  aspirin 81 MG tablet, Take 81 mg by mouth daily., Disp: , Rfl:  .  carvedilol (COREG) 25 MG tablet, Take 0.5 tablets (12.5 mg total) by mouth 2 (two) times daily with a meal., Disp: , Rfl:  .  escitalopram (LEXAPRO) 10 MG tablet, Take 20 mg by mouth., Disp: , Rfl:  .  furosemide (LASIX) 20 MG tablet, Take 1 tablet (20 mg total) by mouth daily. (Patient taking differently: Take 20 mg by mouth every other day. ), Disp: 30 tablet, Rfl: 3 .  lisinopril (PRINIVIL,ZESTRIL) 5 MG tablet, Take 2.5 mg by mouth., Disp: , Rfl:  .  lovastatin (MEVACOR) 40 MG tablet, Take 40 mg by mouth at bedtime., Disp: , Rfl:  .  senna (SENOKOT) 8.6 MG TABS tablet, Take by mouth., Disp: , Rfl:  .  tamsulosin (FLOMAX) 0.4 MG CAPS, Take 0.4 mg by mouth daily., Disp: , Rfl:  .  Aspirin-Caffeine (BC FAST PAIN RELIEF ARTHRITIS) 1000-65 MG PACK, Take by mouth as needed. Reported on 02/10/2015, Disp: , Rfl:  .  benazepril (LOTENSIN) 40 MG tablet, Take 40 mg by mouth 2 (two) times daily. Reported on 02/10/2015, Disp: , Rfl:  .  clonazePAM (KLONOPIN) 1 MG tablet, Take 1 mg by mouth daily. Reported on 02/10/2015, Disp: , Rfl:  .  docusate sodium (COLACE) 100 MG capsule, Take 100 mg by mouth 2 (two) times daily. Reported on 02/10/2015, Disp: , Rfl:  .  metFORMIN (GLUCOPHAGE) 1000 MG tablet, Take 1,000 mg by mouth 2 (two) times daily with a meal. Reported on 02/10/2015, Disp: , Rfl:  .  oxyCODONE (OXY IR/ROXICODONE) 5 MG immediate release tablet, Take 5 mg by mouth every 6 (six) hours as needed for  pain. Reported on 02/10/2015, Disp: , Rfl:  .  PARoxetine (PAXIL-CR) 12.5 MG 24 hr tablet, Take 12.5 mg by mouth daily. Reported on 02/10/2015, Disp: , Rfl:  .  rOPINIRole (REQUIP) 0.25 MG tablet, Take 0.25 mg by mouth 3 (three) times daily. Reported on 02/10/2015, Disp: , Rfl:  .  tiotropium (SPIRIVA) 18 MCG inhalation capsule, Place 18 mcg into inhaler and inhale daily. Reported on 02/10/2015, Disp: , Rfl:   Past Medical History: Past Medical History  Diagnosis Date  . Hyperlipidemia   . Diabetes mellitus without complication ([place])   . BPH (benign prostatic hyperplasia)   . COPD (chronic obstructive pulmonary disease) ([place])   . Chronic kidney disease     stage III  . Chronic diastolic heart failure ([place])   . Hypertension     Tobacco Use: History  Smoking status  . Former Smoker -- 1.00 packs/day for 60 years  . Types: Cigarettes  . Quit date: 10/11/2014  Smokeless tobacco  . Not on file    Labs: Recent Review Flowsheet Data    There is no flowsheet data to display.       Exercise Target Goals: Date: 02/10/15  Exercise Program Goal: Individual exercise prescription set with THRR, safety & activity barriers. Participant demonstrates ability to understand and report RPE using BORG scale, to self-measure  pulse accurately, and to acknowledge the importance of the exercise prescription.  Exercise Prescription Goal: Starting with aerobic activity 30 plus minutes a day, 3 days per week for initial exercise prescription. Provide home exercise prescription and guidelines that participant acknowledges understanding prior to discharge.  Activity Barriers & Risk Stratification:     Activity Barriers & Risk Stratification - 02/10/15 1514    Activity Barriers & Risk Stratification   Activity Barriers Other (comment)   Comments Left knee problems/pain - States "knee is worn out."  Rest and sitting helps pain.  Lower back problems/pain especially with walking long distances.     Risk  Stratification High      6 Minute Walk:     6 Minute Walk      02/10/15 1514       6 Minute Walk   Phase Initial     Distance 1130 feet     Walk Time 6 minutes     Resting HR 67 bpm     Resting BP 146/70 mmHg     Max Ex. HR 104 bpm     Max Ex. BP 178/70 mmHg     RPE 13     Symptoms Yes (comment)     Comments SOB, multifocal PVCs        Initial Exercise Prescription:     Initial Exercise Prescription - 02/10/15 1500    Date of Initial Exercise Prescription   Date 02/10/15   Treadmill   MPH 1.8   Grade 0   Minutes 5  Use 5 minute intervals, can do multiple sets   Bike   Level 0.2   Watts 12   Minutes 10   Recumbant Bike   Level 3   RPM 40   Watts 25   Minutes 15   NuStep   Level 2   Watts 25   Minutes 15   Arm Ergometer   Level 1   Watts 8   Minutes 10   Arm/Foot Ergometer   Level 4   Watts 12   Minutes 10   Cybex   Level 2   RPM 50   Minutes 10   Recumbant Elliptical   Level 1   RPM 40   Watts 10   Minutes 10   REL-XR   Level 2   Watts 30   Minutes 15   T5 Nustep   Level 2   Watts 15   Minutes 15   Biostep-RELP   Level 2   Watts 15   Minutes 15   Prescription Details   Frequency (times per week) 3   Duration Progress to 30 minutes of continuous aerobic without signs/symptoms of physical distress   Intensity   THRR REST +  30   Ratings of Perceived Exertion 11-15   Progression Continue progressive overload as per policy without signs/symptoms or physical distress.   Resistance Training   Training Prescription Yes   Weight 2   Reps 10-15      Exercise Prescription Changes:     Exercise Prescription Changes      02/22/15 1300           Exercise Review   Progression No  Has not started exercise, med review 02/10/15          Discharge Exercise Prescription (Final Exercise Prescription Changes):     Exercise Prescription Changes - 02/22/15 1300    Exercise Review   Progression No  Has not started exercise, med  review 02/10/15  Nutrition:  Target Goals: Understanding of nutrition guidelines, daily intake of sodium 1500mg , cholesterol 200mg , calories 30% from fat and 7% or less from saturated fats, daily to have 5 or more servings of fruits and vegetables.  Biometrics:      Post Biometrics - 02/10/15 1501     Post  Biometrics   Height 5' 7.75" (1.721 m)   Weight 199 lb (90.266 kg)   Waist Circumference 42.25 inches   Hip Circumference 44.5 inches   Waist to Hip Ratio 0.95 %   BMI (Calculated) 30.5      Nutrition Therapy Plan and Nutrition Goals:   Nutrition Discharge: Rate Your Plate Scores:   Nutrition Goals Re-Evaluation:   Psychosocial: Target Goals: Acknowledge presence or absence of depression, maximize coping skills, provide positive support system. Participant is able to verbalize types and ability to use techniques and skills needed for reducing stress and depression.  Initial Review & Psychosocial Screening:     Initial Psych Review & Screening - 02/10/15 1526    Initial Review   Current issues with --  History of Anxiety/Panic Attacks from 2008 to fall 2016.  Panic attacks resolved after CABG and since started on Lexapro.     Family Dynamics   Good Support System? Yes   Comments WIfe and 85 year old grandson who moved in with Mr. Diantonio and his wife after grandson graduated from college.     Barriers   Psychosocial barriers to participate in program There are no identifiable barriers or psychosocial needs.   Screening Interventions   Interventions Encouraged to exercise;Program counselor consult      Quality of Life Scores:     Quality of Life - 02/10/15 1540    Quality of Life Scores   Health/Function Pre 25.07 %   Socioeconomic Pre 30 %   Psych/Spiritual Pre 29.14 %   Family Pre 28.8 %   GLOBAL Pre 27.55 %      PHQ-9:     Recent Review Flowsheet Data    Depression screen [facility] 2/9 02/10/2015   Decreased Interest 0   Down, Depressed, Hopeless 0    PHQ - 2 Score 0   Altered sleeping 3   Tired, decreased energy 2   Change in appetite 1   Feeling bad or failure about yourself  0   Trouble concentrating 0   Moving slowly or fidgety/restless 0   Suicidal thoughts 0   PHQ-9 Score 6   Difficult doing work/chores Not difficult at all      Psychosocial Evaluation and Intervention:   Psychosocial Re-Evaluation:   Vocational Rehabilitation: Provide vocational rehab assistance to qualifying candidates.   Vocational Rehab Evaluation & Intervention:     Vocational Rehab - 02/10/15 1518    Initial Vocational Rehab Evaluation & Intervention   Assessment shows need for Vocational Rehabilitation No      Education: Education Goals: Education classes will be provided on a weekly basis, covering required topics. Participant will state understanding/return demonstration of topics presented.  Learning Barriers/Preferences:     Learning Barriers/Preferences - 02/10/15 1517    Learning Barriers/Preferences   Learning Barriers Hearing;Exercise Concerns   Learning Preferences Written Material      Education Topics: General Nutrition Guidelines/Fats and Fiber: -Group instruction provided by verbal, written material, models and posters to present the general guidelines for heart healthy nutrition. Gives an explanation and review of dietary fats and fiber.   Controlling Sodium/Reading Food Labels: -Group verbal and written material supporting the discussion of  sodium use in heart healthy nutrition. Review and explanation with models, verbal and written materials for utilization of the food label.   Exercise Physiology & Risk Factors: - Group verbal and written instruction with models to review the exercise physiology of the cardiovascular system and associated critical values. Details cardiovascular disease risk factors and the goals associated with each risk factor.   Aerobic Exercise & Resistance Training: - Gives group verbal  and written discussion on the health impact of inactivity. On the components of aerobic and resistive training programs and the benefits of this training and how to safely progress through these programs.   Flexibility, Balance, General Exercise Guidelines: - Provides group verbal and written instruction on the benefits of flexibility and balance training programs. Provides general exercise guidelines with specific guidelines to those with heart or lung disease. Demonstration and skill practice provided.   Stress Management: - Provides group verbal and written instruction about the health risks of elevated stress, cause of high stress, and healthy ways to reduce stress.   Depression: - Provides group verbal and written instruction on the correlation between heart/lung disease and depressed mood, treatment options, and the stigmas associated with seeking treatment.   Anatomy & Physiology of the Heart: - Group verbal and written instruction and models provide basic cardiac anatomy and physiology, with the coronary electrical and arterial systems. Review of: AMI, Angina, Valve disease, Heart Failure, Cardiac Arrhythmia, Pacemakers, and the ICD.   Cardiac Procedures: - Group verbal and written instruction and models to describe the testing methods done to diagnose heart disease. Reviews the outcomes of the test results. Describes the treatment choices: Medical Management, Angioplasty, or Coronary Bypass Surgery.   Cardiac Medications: - Group verbal and written instruction to review commonly prescribed medications for heart disease. Reviews the medication, class of the drug, and side effects. Includes the steps to properly store meds and maintain the prescription regimen.   Go Sex-Intimacy & Heart Disease, Get SMART - Goal Setting: - Group verbal and written instruction through game format to discuss heart disease and the return to sexual intimacy. Provides group verbal and written material  to discuss and apply goal setting through the application of the S.M.A.R.T. Method.   Other Matters of the Heart: - Provides group verbal, written materials and models to describe Heart Failure, Angina, Valve Disease, and Diabetes in the realm of heart disease. Includes description of the disease process and treatment options available to the cardiac patient.   Exercise & Equipment Safety: - Individual verbal instruction and demonstration of equipment use and safety with use of the equipment.          Cardiac Rehab from 02/10/2015 in [facility] Cardiac and Pulmonary Rehab   Date  02/10/15   Educator  DW   Instruction Review Code  1- partially meets, needs review/practice      Infection Prevention: - Provides verbal and written material to individual with discussion of infection control including proper hand washing and proper equipment cleaning during exercise session.      Cardiac Rehab from 02/10/2015 in [facility] Cardiac and Pulmonary Rehab   Date  02/10/15   Educator  DW   Instruction Review Code  2- meets goals/outcomes      Falls Prevention: - Provides verbal and written material to individual with discussion of falls prevention and safety.      Cardiac Rehab from 02/10/2015 in [facility] Cardiac and Pulmonary Rehab   Date  02/10/15   Educator  DW   Instruction Review  Code  2- meets goals/outcomes      Diabetes: - Individual verbal and written instruction to review signs/symptoms of diabetes, desired ranges of glucose level fasting, after meals and with exercise. Advice that pre and post exercise glucose checks will be done for 3 sessions at entry of program.      Cardiac Rehab from 02/10/2015 in [facility] Cardiac and Pulmonary Rehab   Date  02/10/15   Educator  DW   Instruction Review Code  2- meets goals/outcomes       Knowledge Questionnaire Score:     Knowledge Questionnaire Score - 02/10/15 1541    Knowledge Questionnaire Score   Pre Score 22/28      Personal Goals and Risk  Factors at Admission:     Personal Goals and Risk Factors at Admission - 02/10/15 1521    Personal Goals and Risk Factors on Admission   Increase Aerobic Exercise and Physical Activity Yes   Intervention While in program, learn and follow the exercise prescription taught. Start at a low level workload and increase workload after able to maintain previous level for 30 minutes. Increase time before increasing intensity.   Take Less Medication Yes   Intervention Learn your risk factors and begin the lifestyle modifications for risk factor control during your time in the program.   Understand more about Heart/Pulmonary Disease. Yes   Intervention While in program utilize professionals for any questions, and attend the education sessions. Great websites to use are www.americanheart.org or www.lung.org for reliable information.   Diabetes Yes   Goal Blood glucose control identified by blood glucose values, HgbA1C. Participant verbalizes understanding of the signs/symptoms of hyper/hypo glycemia, proper foot care and importance of medication and nutrition plan for blood glucose control.   Intervention Provide nutrition & aerobic exercise along with prescribed medications to achieve blood glucose in normal ranges: Fasting 65-99 mg/dL   Hypertension Yes   Goal Participant will see blood pressure controlled within the values of 140/10mm/Hg or within value directed by their physician.   Intervention Provide nutrition & aerobic exercise along with prescribed medications to achieve BP 140/90 or less.   Lipids Yes   Goal Cholesterol controlled with medications as prescribed, with individualized exercise RX and with personalized nutrition plan. Value goals: LDL < 70mg , HDL > 40mg . Participant states understanding of desired cholesterol values and following prescriptions.   Intervention Provide nutrition & aerobic exercise along with prescribed medications to achieve LDL 70mg , HDL >40mg .      Personal Goals  and Risk Factors Review:    Personal Goals Discharge (Final Personal Goals and Risk Factors Review):    ITP Comments:     ITP Comments      03/04/15 1319           ITP Comments Ready for 30 day review. Continue with ITP.  Orientation completed  ahs not been able to attend program since, because of knee pain.          Comments:

## 2015-03-08 ENCOUNTER — Ambulatory Visit: Payer: Medicare Other

## 2015-03-10 ENCOUNTER — Ambulatory Visit: Payer: Medicare Other

## 2015-03-10 NOTE — Addendum Note (Signed)
Addended by: Lynford Humphrey on: 03/10/2015 10:48 AM   Modules accepted: Orders

## 2015-03-11 ENCOUNTER — Ambulatory Visit: Payer: Medicare Other

## 2015-03-15 ENCOUNTER — Ambulatory Visit: Payer: Medicare Other

## 2015-03-17 ENCOUNTER — Ambulatory Visit: Payer: Medicare Other

## 2015-03-18 ENCOUNTER — Ambulatory Visit: Payer: Medicare Other

## 2015-03-22 ENCOUNTER — Ambulatory Visit: Payer: Medicare Other

## 2015-03-24 ENCOUNTER — Ambulatory Visit: Payer: Medicare Other

## 2015-03-25 ENCOUNTER — Ambulatory Visit: Payer: Medicare Other

## 2015-03-29 ENCOUNTER — Ambulatory Visit: Payer: Medicare Other

## 2015-03-30 NOTE — Progress Notes (Signed)
Cardiac Individual Treatment Plan  Patient Details  Name: Brent Glover MRN: [id number] Date of Birth: [birth year] Referring Provider:  Gustavo Lah, MD  Initial Encounter Date: Date: 02/10/15  Visit Diagnosis: S/P CABG x 4 - Plan: CARDIAC REHAB 30 DAY REVIEW, CARDIAC REHAB 30 DAY REVIEW  Patient's Home Medications on Admission:  Current outpatient prescriptions:  .  amLODipine (NORVASC) 10 MG tablet, Take 0.5 tablets (5 mg total) by mouth daily., Disp: , Rfl:  .  aspirin 81 MG tablet, Take 81 mg by mouth daily., Disp: , Rfl:  .  carvedilol (COREG) 25 MG tablet, Take 0.5 tablets (12.5 mg total) by mouth 2 (two) times daily with a meal., Disp: , Rfl:  .  escitalopram (LEXAPRO) 10 MG tablet, Take 20 mg by mouth., Disp: , Rfl:  .  furosemide (LASIX) 20 MG tablet, Take 1 tablet (20 mg total) by mouth daily. (Patient taking differently: Take 20 mg by mouth every other day. ), Disp: 30 tablet, Rfl: 3 .  lisinopril (PRINIVIL,ZESTRIL) 5 MG tablet, Take 2.5 mg by mouth., Disp: , Rfl:  .  lovastatin (MEVACOR) 40 MG tablet, Take 40 mg by mouth at bedtime., Disp: , Rfl:  .  senna (SENOKOT) 8.6 MG TABS tablet, Take by mouth., Disp: , Rfl:  .  tamsulosin (FLOMAX) 0.4 MG CAPS, Take 0.4 mg by mouth daily., Disp: , Rfl:  .  Aspirin-Caffeine (BC FAST PAIN RELIEF ARTHRITIS) 1000-65 MG PACK, Take by mouth as needed. Reported on 02/10/2015, Disp: , Rfl:  .  benazepril (LOTENSIN) 40 MG tablet, Take 40 mg by mouth 2 (two) times daily. Reported on 02/10/2015, Disp: , Rfl:  .  clonazePAM (KLONOPIN) 1 MG tablet, Take 1 mg by mouth daily. Reported on 02/10/2015, Disp: , Rfl:  .  docusate sodium (COLACE) 100 MG capsule, Take 100 mg by mouth 2 (two) times daily. Reported on 02/10/2015, Disp: , Rfl:  .  metFORMIN (GLUCOPHAGE) 1000 MG tablet, Take 1,000 mg by mouth 2 (two) times daily with a meal. Reported on 02/10/2015, Disp: , Rfl:  .  oxyCODONE (OXY IR/ROXICODONE) 5 MG immediate release tablet, Take 5 mg by mouth every  6 (six) hours as needed for pain. Reported on 02/10/2015, Disp: , Rfl:  .  PARoxetine (PAXIL-CR) 12.5 MG 24 hr tablet, Take 12.5 mg by mouth daily. Reported on 02/10/2015, Disp: , Rfl:  .  rOPINIRole (REQUIP) 0.25 MG tablet, Take 0.25 mg by mouth 3 (three) times daily. Reported on 02/10/2015, Disp: , Rfl:  .  tiotropium (SPIRIVA) 18 MCG inhalation capsule, Place 18 mcg into inhaler and inhale daily. Reported on 02/10/2015, Disp: , Rfl:   Past Medical History: Past Medical History  Diagnosis Date  . Hyperlipidemia   . Diabetes mellitus without complication ([place])   . BPH (benign prostatic hyperplasia)   . COPD (chronic obstructive pulmonary disease) ([place])   . Chronic kidney disease     stage III  . Chronic diastolic heart failure ([place])   . Hypertension     Tobacco Use: History  Smoking status  . Former Smoker -- 1.00 packs/day for 60 years  . Types: Cigarettes  . Quit date: 10/11/2014  Smokeless tobacco  . Not on file    Labs: Recent Review Flowsheet Data    There is no flowsheet data to display.       Exercise Target Goals: Date: 02/10/15  Exercise Program Goal: Individual exercise prescription set with THRR, safety & activity barriers. Participant demonstrates ability to understand and report RPE  using BORG scale, to self-measure pulse accurately, and to acknowledge the importance of the exercise prescription.  Exercise Prescription Goal: Starting with aerobic activity 30 plus minutes a day, 3 days per week for initial exercise prescription. Provide home exercise prescription and guidelines that participant acknowledges understanding prior to discharge.  Activity Barriers & Risk Stratification:     Activity Barriers & Cardiac Risk Stratification - 02/10/15 1514    Activity Barriers & Cardiac Risk Stratification   Activity Barriers Other (comment)   Comments Left knee problems/pain - States "knee is worn out."  Rest and sitting helps pain.  Lower back problems/pain especially  with walking long distances.     Cardiac Risk Stratification High      6 Minute Walk:     6 Minute Walk      02/10/15 1514       6 Minute Walk   Phase Initial     Distance 1130 feet     Walk Time 6 minutes     RPE 13     Symptoms Yes (comment)     Comments SOB, multifocal PVCs     Resting HR 67 bpm     Resting BP 146/70 mmHg     Max Ex. HR 104 bpm     Max Ex. BP 178/70 mmHg        Initial Exercise Prescription:     Initial Exercise Prescription - 02/10/15 1500    Date of Initial Exercise Prescription   Date 02/10/15   Treadmill   MPH 1.8   Grade 0   Minutes 5  Use 5 minute intervals, can do multiple sets   Bike   Level 0.2   Watts 12   Minutes 10   Recumbant Bike   Level 3   RPM 40   Watts 25   Minutes 15   NuStep   Level 2   Watts 25   Minutes 15   Arm Ergometer   Level 1   Watts 8   Minutes 10   Arm/Foot Ergometer   Level 4   Watts 12   Minutes 10   Cybex   Level 2   RPM 50   Minutes 10   Recumbant Elliptical   Level 1   RPM 40   Watts 10   Minutes 10   REL-XR   Level 2   Watts 30   Minutes 15   T5 Nustep   Level 2   Watts 15   Minutes 15   Biostep-RELP   Level 2   Watts 15   Minutes 15   Prescription Details   Frequency (times per week) 3   Duration Progress to 30 minutes of continuous aerobic without signs/symptoms of physical distress   Intensity   THRR REST +  30   Ratings of Perceived Exertion 11-15   Progression Continue progressive overload as per policy without signs/symptoms or physical distress.   Resistance Training   Training Prescription Yes   Weight 2   Reps 10-15      Exercise Prescription Changes:     Exercise Prescription Changes      02/22/15 1300 03/23/15 0600         Exercise Review   Progression No  Has not started exercise, med review 02/10/15 No  Has not started exercise, med review 02/10/15      Response to Exercise   Comments  Ex. Rx. may need to be re-evaluated since patient has not  attended since md  review on 02/10/15         Discharge Exercise Prescription (Final Exercise Prescription Changes):     Exercise Prescription Changes - 03/23/15 0600    Exercise Review   Progression No  Has not started exercise, med review 02/10/15   Response to Exercise   Comments Ex. Rx. may need to be re-evaluated since patient has not attended since md review on 02/10/15      Nutrition:  Target Goals: Understanding of nutrition guidelines, daily intake of sodium 1500mg , cholesterol 200mg , calories 30% from fat and 7% or less from saturated fats, daily to have 5 or more servings of fruits and vegetables.  Biometrics:      Post Biometrics - 02/10/15 1501     Post  Biometrics   Height 5' 7.75" (1.721 m)   Weight 199 lb (90.266 kg)   Waist Circumference 42.25 inches   Hip Circumference 44.5 inches   Waist to Hip Ratio 0.95 %   BMI (Calculated) 30.5      Nutrition Therapy Plan and Nutrition Goals:   Nutrition Discharge: Rate Your Plate Scores:   Nutrition Goals Re-Evaluation:   Psychosocial: Target Goals: Acknowledge presence or absence of depression, maximize coping skills, provide positive support system. Participant is able to verbalize types and ability to use techniques and skills needed for reducing stress and depression.  Initial Review & Psychosocial Screening:     Initial Psych Review & Screening - 02/10/15 1526    Initial Review   Current issues with --  History of Anxiety/Panic Attacks from 2008 to fall 2016.  Panic attacks resolved after CABG and since started on Lexapro.     Family Dynamics   Good Support System? Yes   Comments WIfe and 45 year old grandson who moved in with Mr. Clavette and his wife after grandson graduated from college.     Barriers   Psychosocial barriers to participate in program There are no identifiable barriers or psychosocial needs.   Screening Interventions   Interventions Encouraged to exercise;Program counselor consult       Quality of Life Scores:     Quality of Life - 02/10/15 1540    Quality of Life Scores   Health/Function Pre 25.07 %   Socioeconomic Pre 30 %   Psych/Spiritual Pre 29.14 %   Family Pre 28.8 %   GLOBAL Pre 27.55 %      PHQ-9:     Recent Review Flowsheet Data    Depression screen [facility] 2/9 02/10/2015   Decreased Interest 0   Down, Depressed, Hopeless 0   PHQ - 2 Score 0   Altered sleeping 3   Tired, decreased energy 2   Change in appetite 1   Feeling bad or failure about yourself  0   Trouble concentrating 0   Moving slowly or fidgety/restless 0   Suicidal thoughts 0   PHQ-9 Score 6   Difficult doing work/chores Not difficult at all      Psychosocial Evaluation and Intervention:   Psychosocial Re-Evaluation:   Vocational Rehabilitation: Provide vocational rehab assistance to qualifying candidates.   Vocational Rehab Evaluation & Intervention:     Vocational Rehab - 02/10/15 1518    Initial Vocational Rehab Evaluation & Intervention   Assessment shows need for Vocational Rehabilitation No      Education: Education Goals: Education classes will be provided on a weekly basis, covering required topics. Participant will state understanding/return demonstration of topics presented.  Learning Barriers/Preferences:     Learning Barriers/Preferences -  02/10/15 1517    Learning Barriers/Preferences   Learning Barriers Hearing;Exercise Concerns   Learning Preferences Written Material      Education Topics: General Nutrition Guidelines/Fats and Fiber: -Group instruction provided by verbal, written material, models and posters to present the general guidelines for heart healthy nutrition. Gives an explanation and review of dietary fats and fiber.   Controlling Sodium/Reading Food Labels: -Group verbal and written material supporting the discussion of sodium use in heart healthy nutrition. Review and explanation with models, verbal and written materials for  utilization of the food label.   Exercise Physiology & Risk Factors: - Group verbal and written instruction with models to review the exercise physiology of the cardiovascular system and associated critical values. Details cardiovascular disease risk factors and the goals associated with each risk factor.   Aerobic Exercise & Resistance Training: - Gives group verbal and written discussion on the health impact of inactivity. On the components of aerobic and resistive training programs and the benefits of this training and how to safely progress through these programs.   Flexibility, Balance, General Exercise Guidelines: - Provides group verbal and written instruction on the benefits of flexibility and balance training programs. Provides general exercise guidelines with specific guidelines to those with heart or lung disease. Demonstration and skill practice provided.   Stress Management: - Provides group verbal and written instruction about the health risks of elevated stress, cause of high stress, and healthy ways to reduce stress.   Depression: - Provides group verbal and written instruction on the correlation between heart/lung disease and depressed mood, treatment options, and the stigmas associated with seeking treatment.   Anatomy & Physiology of the Heart: - Group verbal and written instruction and models provide basic cardiac anatomy and physiology, with the coronary electrical and arterial systems. Review of: AMI, Angina, Valve disease, Heart Failure, Cardiac Arrhythmia, Pacemakers, and the ICD.   Cardiac Procedures: - Group verbal and written instruction and models to describe the testing methods done to diagnose heart disease. Reviews the outcomes of the test results. Describes the treatment choices: Medical Management, Angioplasty, or Coronary Bypass Surgery.   Cardiac Medications: - Group verbal and written instruction to review commonly prescribed medications for heart  disease. Reviews the medication, class of the drug, and side effects. Includes the steps to properly store meds and maintain the prescription regimen.   Go Sex-Intimacy & Heart Disease, Get SMART - Goal Setting: - Group verbal and written instruction through game format to discuss heart disease and the return to sexual intimacy. Provides group verbal and written material to discuss and apply goal setting through the application of the S.M.A.R.T. Method.   Other Matters of the Heart: - Provides group verbal, written materials and models to describe Heart Failure, Angina, Valve Disease, and Diabetes in the realm of heart disease. Includes description of the disease process and treatment options available to the cardiac patient.   Exercise & Equipment Safety: - Individual verbal instruction and demonstration of equipment use and safety with use of the equipment.          Cardiac Rehab from 02/10/2015 in [facility]   Date  02/10/15   Educator  DW   Instruction Review Code  1- partially meets, needs review/practice      Infection Prevention: - Provides verbal and written material to individual with discussion of infection control including proper hand washing and proper equipment cleaning during exercise session.      Cardiac Rehab from 02/10/2015 in  [facility]   Date  02/10/15   Educator  DW   Instruction Review Code  2- meets goals/outcomes      Falls Prevention: - Provides verbal and written material to individual with discussion of falls prevention and safety.      Cardiac Rehab from 02/10/2015 in [facility] and Pul[facility] Rehab   Date  02/10/15   Educator  DW   Instruction Review Code  2- meets goals/outcomes      Diabetes: - Individual verbal and written instruction to review signs/symptoms of diabetes, desired ranges of glucose level fasting, after meals and with exercise. Advice that pre and post exercise glucose checks will be done  for 3 sessions at entry of program.      Cardiac Rehab from 02/10/2015 in [facility] and [facility] Re[facility]   Date  02/10/15   Educator  DW   Instruction Review Code  2- meets goals/outcomes       Knowledge Questionnaire Score:     Knowledge Questionnaire Score - 02/10/15 1541    Knowledge Questionnaire Score   Pre Score 22/28      Personal Goals and Risk Factors at Admission:     Personal Goals and Risk Factors at Admission - 02/10/15 1521    Personal Goals and Risk Factors on Admission   Increase Aerobic Exercise and Physical Activity Yes   Intervention While in program, learn and follow the exercise prescription taught. Start at a low level workload and increase workload after able to maintain previous level for 30 minutes. Increase time before increasing intensity.   Take Less Medication Yes   Intervention Learn your risk factors and begin the lifestyle modifications for risk factor control during your time in the program.   Understand more about Heart/Pulmonary Disease. Yes   Intervention While in program utilize professionals for any questions, and attend the education sessions. Great websites to use are www.americanheart.org or www.lung.org for reliable information.   Diabetes Yes   Goal Blood glucose control identified by blood glucose values, HgbA1C. Participant verbalizes understanding of the signs/symptoms of hyper/hypo glycemia, proper foot care and importance of medication and nutrition plan for blood glucose control.   Intervention Provide nutrition & aerobic exercise along with prescribed medications to achieve blood glucose in normal ranges: Fasting 65-99 mg/dL   Hypertension Yes   Goal Participant will see blood pressure controlled within the values of 140/61mm/Hg or within value directed by their physician.   Intervention Provide nutrition & aerobic exercise along with prescribed medications to achieve BP 140/90 or less.   Lipids Yes   Goal Cholesterol controlled  with medications as prescribed, with individualized exercise RX and with personalized nutrition plan. Value goals: LDL < 70mg , HDL > 40mg . Participant states understanding of desired cholesterol values and following prescriptions.   Intervention Provide nutrition & aerobic exercise along with prescribed medications to achieve LDL 70mg , HDL >40mg .      Personal Goals and Risk Factors Review:    Personal Goals Discharge (Final Personal Goals and Risk Factors Review):    ITP Comments:     ITP Comments      03/04/15 1319 03/30/15 0717         ITP Comments Ready for 30 day review. Continue with ITP.  Orientation completed  ahs not been able to attend program since, because of knee pain. 30 day review.  Continue with ITP.  remains out since orientation         Comments:

## 2015-03-31 ENCOUNTER — Ambulatory Visit: Payer: Medicare Other

## 2015-04-01 ENCOUNTER — Ambulatory Visit: Payer: Medicare Other

## 2015-04-05 ENCOUNTER — Encounter: Payer: Self-pay | Admitting: *Deleted

## 2015-04-05 ENCOUNTER — Ambulatory Visit: Payer: Medicare Other

## 2015-04-05 ENCOUNTER — Telehealth: Payer: Self-pay | Admitting: *Deleted

## 2015-04-05 DIAGNOSIS — Z951 Presence of aortocoronary bypass graft: Secondary | ICD-10-CM

## 2015-04-05 NOTE — Progress Notes (Unsigned)
Cardiac Individual Treatment Plan  Patient Details  Name: Brent Glover MRN: [id number] Date of Birth: January 13, 1936 Referring Provider:  No ref. provider found  Initial Encounter Date:    Visit Diagnosis: S/P CABG x 4  Patient's Home Medications on Admission:  Current outpatient prescriptions:  .  amLODipine (NORVASC) 10 MG tablet, Take 0.5 tablets (5 mg total) by mouth daily., Disp: , Rfl:  .  aspirin 81 MG tablet, Take 81 mg by mouth daily., Disp: , Rfl:  .  Aspirin-Caffeine (BC FAST PAIN RELIEF ARTHRITIS) 1000-65 MG PACK, Take by mouth as needed. Reported on 02/10/2015, Disp: , Rfl:  .  benazepril (LOTENSIN) 40 MG tablet, Take 40 mg by mouth 2 (two) times daily. Reported on 02/10/2015, Disp: , Rfl:  .  carvedilol (COREG) 25 MG tablet, Take 0.5 tablets (12.5 mg total) by mouth 2 (two) times daily with a meal., Disp: , Rfl:  .  clonazePAM (KLONOPIN) 1 MG tablet, Take 1 mg by mouth daily. Reported on 02/10/2015, Disp: , Rfl:  .  docusate sodium (COLACE) 100 MG capsule, Take 100 mg by mouth 2 (two) times daily. Reported on 02/10/2015, Disp: , Rfl:  .  escitalopram (LEXAPRO) 10 MG tablet, Take 20 mg by mouth., Disp: , Rfl:  .  furosemide (LASIX) 20 MG tablet, Take 1 tablet (20 mg total) by mouth daily. (Patient taking differently: Take 20 mg by mouth every other day. ), Disp: 30 tablet, Rfl: 3 .  lisinopril (PRINIVIL,ZESTRIL) 5 MG tablet, Take 2.5 mg by mouth., Disp: , Rfl:  .  lovastatin (MEVACOR) 40 MG tablet, Take 40 mg by mouth at bedtime., Disp: , Rfl:  .  metFORMIN (GLUCOPHAGE) 1000 MG tablet, Take 1,000 mg by mouth 2 (two) times daily with a meal. Reported on 02/10/2015, Disp: , Rfl:  .  oxyCODONE (OXY IR/ROXICODONE) 5 MG immediate release tablet, Take 5 mg by mouth every 6 (six) hours as needed for pain. Reported on 02/10/2015, Disp: , Rfl:  .  PARoxetine (PAXIL-CR) 12.5 MG 24 hr tablet, Take 12.5 mg by mouth daily. Reported on 02/10/2015, Disp: , Rfl:  .  rOPINIRole (REQUIP) 0.25 MG tablet, Take  0.25 mg by mouth 3 (three) times daily. Reported on 02/10/2015, Disp: , Rfl:  .  senna (SENOKOT) 8.6 MG TABS tablet, Take by mouth., Disp: , Rfl:  .  tamsulosin (FLOMAX) 0.4 MG CAPS, Take 0.4 mg by mouth daily., Disp: , Rfl:  .  tiotropium (SPIRIVA) 18 MCG inhalation capsule, Place 18 mcg into inhaler and inhale daily. Reported on 02/10/2015, Disp: , Rfl:   Past Medical History: Past Medical History  Diagnosis Date  . Hyperlipidemia   . Diabetes mellitus without complication ([place])   . BPH (benign prostatic hyperplasia)   . COPD (chronic obstructive pulmonary disease) ([place])   . Chronic kidney disease     stage III  . Chronic diastolic heart failure ([place])   . Hypertension     Tobacco Use: History  Smoking status  . Former Smoker -- 1.00 packs/day for 60 years  . Types: Cigarettes  . Quit date: 10/11/2014  Smokeless tobacco  . Not on file    Labs: Recent Review Flowsheet Data    There is no flowsheet data to display.       Exercise Target Goals:    Exercise Program Goal: Individual exercise prescription set with THRR, safety & activity barriers. Participant demonstrates ability to understand and report RPE using BORG scale, to self-measure pulse accurately, and to acknowledge the importance  of the exercise prescription.  Exercise Prescription Goal: Starting with aerobic activity 30 plus minutes a day, 3 days per week for initial exercise prescription. Provide home exercise prescription and guidelines that participant acknowledges understanding prior to discharge.  Activity Barriers & Risk Stratification:     Activity Barriers & Cardiac Risk Stratification - 02/10/15 1514    Activity Barriers & Cardiac Risk Stratification   Activity Barriers Other (comment)   Comments Left knee problems/pain - States "knee is worn out."  Rest and sitting helps pain.  Lower back problems/pain especially with walking long distances.     Cardiac Risk Stratification High      6 Minute  Walk:     6 Minute Walk      02/10/15 1514       6 Minute Walk   Phase Initial     Distance 1130 feet     Walk Time 6 minutes     RPE 13     Symptoms Yes (comment)     Comments SOB, multifocal PVCs     Resting HR 67 bpm     Resting BP 146/70 mmHg     Max Ex. HR 104 bpm     Max Ex. BP 178/70 mmHg        Initial Exercise Prescription:     Initial Exercise Prescription - 02/10/15 1500    Date of Initial Exercise Prescription   Date 02/10/15   Treadmill   MPH 1.8   Grade 0   Minutes 5  Use 5 minute intervals, can do multiple sets   Bike   Level 0.2   Watts 12   Minutes 10   Recumbant Bike   Level 3   RPM 40   Watts 25   Minutes 15   NuStep   Level 2   Watts 25   Minutes 15   Arm Ergometer   Level 1   Watts 8   Minutes 10   Arm/Foot Ergometer   Level 4   Watts 12   Minutes 10   Cybex   Level 2   RPM 50   Minutes 10   Recumbant Elliptical   Level 1   RPM 40   Watts 10   Minutes 10   REL-XR   Level 2   Watts 30   Minutes 15   T5 Nustep   Level 2   Watts 15   Minutes 15   Biostep-RELP   Level 2   Watts 15   Minutes 15   Prescription Details   Frequency (times per week) 3   Duration Progress to 30 minutes of continuous aerobic without signs/symptoms of physical distress   Intensity   THRR REST +  30   Ratings of Perceived Exertion 11-15   Progression Continue progressive overload as per policy without signs/symptoms or physical distress.   Resistance Training   Training Prescription Yes   Weight 2   Reps 10-15      Exercise Prescription Changes:     Exercise Prescription Changes      02/22/15 1300 03/23/15 0600         Exercise Review   Progression No  Has not started exercise, med review 02/10/15 No  Has not started exercise, med review 02/10/15      Response to Exercise   Comments  Ex. Rx. may need to be re-evaluated since patient has not attended since md review on 02/10/15         Discharge Exercise  Prescription (Final  Exercise Prescription Changes):     Exercise Prescription Changes - 03/23/15 0600    Exercise Review   Progression No  Has not started exercise, med review 02/10/15   Response to Exercise   Comments Ex. Rx. may need to be re-evaluated since patient has not attended since md review on 02/10/15      Nutrition:  Target Goals: Understanding of nutrition guidelines, daily intake of sodium 1500mg , cholesterol 200mg , calories 30% from fat and 7% or less from saturated fats, daily to have 5 or more servings of fruits and vegetables.  Biometrics:      Post Biometrics - 02/10/15 1501     Post  Biometrics   Height 5' 7.75" (1.721 m)   Weight 199 lb (90.266 kg)   Waist Circumference 42.25 inches   Hip Circumference 44.5 inches   Waist to Hip Ratio 0.95 %   BMI (Calculated) 30.5      Nutrition Therapy Plan and Nutrition Goals:   Nutrition Discharge: Rate Your Plate Scores:   Nutrition Goals Re-Evaluation:   Psychosocial: Target Goals: Acknowledge presence or absence of depression, maximize coping skills, provide positive support system. Participant is able to verbalize types and ability to use techniques and skills needed for reducing stress and depression.  Initial Review & Psychosocial Screening:     Initial Psych Review & Screening - 02/10/15 1526    Initial Review   Current issues with --  History of Anxiety/Panic Attacks from 2008 to fall 2016.  Panic attacks resolved after CABG and since started on Lexapro.     Family Dynamics   Good Support System? Yes   Comments Glover and 55 year old grandson who moved in with Brent Glover after grandson graduated from college.     Barriers   Psychosocial barriers to participate in program There are no identifiable barriers or psychosocial needs.   Screening Interventions   Interventions Encouraged to exercise;Program counselor consult      Quality of Life Scores:     Quality of Life - 02/10/15 1540    Quality of Life  Scores   Health/Function Pre 25.07 %   Socioeconomic Pre 30 %   Psych/Spiritual Pre 29.14 %   Family Pre 28.8 %   GLOBAL Pre 27.55 %      PHQ-9:     Recent Review Flowsheet Data    Depression screen [facility] 2/9 02/10/2015   Decreased Interest 0   Down, Depressed, Hopeless 0   PHQ - 2 Score 0   Altered sleeping 3   Tired, decreased energy 2   Change in appetite 1   Feeling bad or failure about yourself  0   Trouble concentrating 0   Moving slowly or fidgety/restless 0   Suicidal thoughts 0   PHQ-9 Score 6   Difficult doing work/chores Not difficult at all      Psychosocial Evaluation and Intervention:   Psychosocial Re-Evaluation:     Psychosocial Re-Evaluation      04/05/15 1124           Psychosocial Re-Evaluation   Interventions Encouraged to attend Cardiac Rehabilitation for the exercise       Comments I called Brent Glover and he said he doesn't feel he can do Cardiac Rehab since his joints hurt too bad. He said his back and legs hurt also.           Vocational Rehabilitation: Provide vocational rehab assistance to qualifying candidates.   Vocational Rehab  Evaluation & Intervention:     Vocational Rehab - 02/10/15 1518    Initial Vocational Rehab Evaluation & Intervention   Assessment shows need for Vocational Rehabilitation No      Education: Education Goals: Education classes will be provided on a weekly basis, covering required topics. Participant will state understanding/return demonstration of topics presented.  Learning Barriers/Preferences:     Learning Barriers/Preferences - 02/10/15 1517    Learning Barriers/Preferences   Learning Barriers Hearing;Exercise Concerns   Learning Preferences Written Material      Education Topics: General Nutrition Guidelines/Fats and Fiber: -Group instruction provided by verbal, written material, models and posters to present the general guidelines for heart healthy nutrition. Gives an explanation and  review of dietary fats and fiber.   Controlling Sodium/Reading Food Labels: -Group verbal and written material supporting the discussion of sodium use in heart healthy nutrition. Review and explanation with models, verbal and written materials for utilization of the food label.   Exercise Physiology & Risk Factors: - Group verbal and written instruction with models to review the exercise physiology of the cardiovascular system and associated critical values. Details cardiovascular disease risk factors and the goals associated with each risk factor.   Aerobic Exercise & Resistance Training: - Gives group verbal and written discussion on the health impact of inactivity. On the components of aerobic and resistive training programs and the benefits of this training and how to safely progress through these programs.   Flexibility, Balance, General Exercise Guidelines: - Provides group verbal and written instruction on the benefits of flexibility and balance training programs. Provides general exercise guidelines with specific guidelines to those with heart or lung disease. Demonstration and skill practice provided.   Stress Management: - Provides group verbal and written instruction about the health risks of elevated stress, cause of high stress, and healthy ways to reduce stress.   Depression: - Provides group verbal and written instruction on the correlation between heart/lung disease and depressed mood, treatment options, and the stigmas associated with seeking treatment.   Anatomy & Physiology of the Heart: - Group verbal and written instruction and models provide basic cardiac anatomy and physiology, with the coronary electrical and arterial systems. Review of: AMI, Angina, Valve disease, Heart Failure, Cardiac Arrhythmia, Pacemakers, and the ICD.   Cardiac Procedures: - Group verbal and written instruction and models to describe the testing methods done to diagnose heart disease.  Reviews the outcomes of the test results. Describes the treatment choices: Medical Management, Angioplasty, or Coronary Bypass Surgery.   Cardiac Medications: - Group verbal and written instruction to review commonly prescribed medications for heart disease. Reviews the medication, class of the drug, and side effects. Includes the steps to properly store meds and maintain the prescription regimen.   Go Sex-Intimacy & Heart Disease, Get SMART - Goal Setting: - Group verbal and written instruction through game format to discuss heart disease and the return to sexual intimacy. Provides group verbal and written material to discuss and apply goal setting through the application of the S.M.A.R.T. Method.   Other Matters of the Heart: - Provides group verbal, written materials and models to describe Heart Failure, Angina, Valve Disease, and Diabetes in the realm of heart disease. Includes description of the disease process and treatment options available to the cardiac patient.   Exercise & Equipment Safety: - Individual verbal instruction and demonstration of equipment use and safety with use of the equipment.          Cardiac Rehab from 02/10/2015  in [facility] Cardiac and Pulmonary Rehab   Date  02/10/15   Educator  DW   Instruction Review Code  1- partially meets, needs review/practice      Infection Prevention: - Provides verbal and written material to individual with discussion of infection control including proper hand washing and proper equipment cleaning during exercise session.      Cardiac Rehab from 02/10/2015 in [facility] Cardiac and Pulmonary Rehab   Date  02/10/15   Educator  DW   Instruction Review Code  2- meets goals/outcomes      Falls Prevention: - Provides verbal and written material to individual with discussion of falls prevention and safety.      Cardiac Rehab from 02/10/2015 in [facility] Cardiac and Pulmonary Rehab   Date  02/10/15   Educator  DW   Instruction Review Code  2-  meets goals/outcomes      Diabetes: - Individual verbal and written instruction to review signs/symptoms of diabetes, desired ranges of glucose level fasting, after meals and with exercise. Advice that pre and post exercise glucose checks will be done for 3 sessions at entry of program.      Cardiac Rehab from 02/10/2015 in [facility] Cardiac and Pulmonary Rehab   Date  02/10/15   Educator  DW   Instruction Review Code  2- meets goals/outcomes       Knowledge Questionnaire Score:     Knowledge Questionnaire Score - 02/10/15 1541    Knowledge Questionnaire Score   Pre Score 22/28      Personal Goals and Risk Factors at Admission:     Personal Goals and Risk Factors at Admission - 02/10/15 1521    Core Components/Risk Factors/Patient Goals on Admission   Sedentary Yes   Intervention (read-only) While in program, learn and follow the exercise prescription taught. Start at a low level workload and increase workload after able to maintain previous level for 30 minutes. Increase time before increasing intensity.   Diabetes Yes   Goal Blood glucose control identified by blood glucose values, HgbA1C. Participant verbalizes understanding of the signs/symptoms of hyper/hypo glycemia, proper foot care and importance of medication and nutrition plan for blood glucose control.   Intervention (read-only) Provide nutrition & aerobic exercise along with prescribed medications to achieve blood glucose in normal ranges: Fasting 65-99 mg/dL   Hypertension Yes   Goal Participant will see blood pressure controlled within the values of 140/52mm/Hg or within value directed by their physician.   Intervention (read-only) Provide nutrition & aerobic exercise along with prescribed medications to achieve BP 140/90 or less.   Lipids Yes   Goal Cholesterol controlled with medications as prescribed, with individualized exercise RX and with personalized nutrition plan. Value goals: LDL < 70mg , HDL > 40mg . Participant  states understanding of desired cholesterol values and following prescriptions.   Intervention (read-only) Provide nutrition & aerobic exercise along with prescribed medications to achieve LDL 70mg , HDL >40mg .   Take Less Medication Yes   Intervention Learn your risk factors and begin the lifestyle modifications for risk factor control during your time in the program.   Understand more about Heart/Pulmonary Disease. Yes   Intervention While in program utilize professionals for any questions, and attend the education sessions. Great websites to use are www.americanheart.org or www.lung.org for reliable information.      Personal Goals and Risk Factors Review:      Goals and Risk Factor Review      04/05/15 1124  Core Components/Risk Factors/Patient Goals Review   Review I called Brent Glover and he said he doesn't feel he can do Cardiac Rehab since his joints hurt too bad. He said his back and legs hurt also.           Personal Goals Discharge (Final Personal Goals and Risk Factors Review):      Goals and Risk Factor Review - 04/05/15 1124    Core Components/Risk Factors/Patient Goals Review   Review I called Brent Glover and he said he doesn't feel he can do Cardiac Rehab since his joints hurt too bad. He said his back and legs hurt also.       ITP Comments:     ITP Comments      03/04/15 1319 03/30/15 0717 04/05/15 1124       ITP Comments Ready for 30 day review. Continue with ITP.  Orientation completed  ahs not been able to attend program since, because of knee pain. 30 day review.  Continue with ITP.  remains out since orientation I called Brent Glover and he said he doesn't feel he can do Cardiac Rehab since his joints hurt too bad. He said his back and legs hurt also.         Comments: I called Brent Glover and he said he doesn't feel he can do Cardiac Rehab since his joints hurt too bad. He said his back and legs hurt also.

## 2015-04-05 NOTE — Progress Notes (Signed)
Discharge Summary  Patient Details  Name: Brent Glover MRN: [id number] Date of Birth: [birth year] Referring Provider:  No ref. provider found   Number of Visits: 1/36  Reason for Discharge:  Early Exit:  Personal  Smoking History:  History  Smoking status  . Former Smoker -- 1.00 packs/day for 60 years  . Types: Cigarettes  . Quit date: 10/11/2014  Smokeless tobacco  . Not on file    Diagnosis:  S/P CABG x 4 - Plan: CARDIAC REHAB 30 DAY REVIEW  ADL UCSD:   Initial Exercise Prescription:     Initial Exercise Prescription - 02/10/15 1500    Date of Initial Exercise Prescription   Date 02/10/15   Treadmill   MPH 1.8   Grade 0   Minutes 5  Use 5 minute intervals, can do multiple sets   Bike   Level 0.2   Watts 12   Minutes 10   Recumbant Bike   Level 3   RPM 40   Watts 25   Minutes 15   NuStep   Level 2   Watts 25   Minutes 15   Arm Ergometer   Level 1   Watts 8   Minutes 10   Arm/Foot Ergometer   Level 4   Watts 12   Minutes 10   Cybex   Level 2   RPM 50   Minutes 10   Recumbant Elliptical   Level 1   RPM 40   Watts 10   Minutes 10   REL-XR   Level 2   Watts 30   Minutes 15   T5 Nustep   Level 2   Watts 15   Minutes 15   Biostep-RELP   Level 2   Watts 15   Minutes 15   Prescription Details   Frequency (times per week) 3   Duration Progress to 30 minutes of continuous aerobic without signs/symptoms of physical distress   Intensity   THRR REST +  30   Ratings of Perceived Exertion 11-15   Progression Continue progressive overload as per policy without signs/symptoms or physical distress.   Resistance Training   Training Prescription Yes   Weight 2   Reps 10-15      Discharge Exercise Prescription (Final Exercise Prescription Changes):     Exercise Prescription Changes - 03/23/15 0600    Exercise Review   Progression No  Has not started exercise, med review 02/10/15   Response to Exercise   Comments Ex. Rx. may need to  be re-evaluated since patient has not attended since md review on 02/10/15      Functional Capacity:     6 Minute Walk      02/10/15 1514       6 Minute Walk   Phase Initial     Distance 1130 feet     Walk Time 6 minutes     RPE 13     Symptoms Yes (comment)     Comments SOB, multifocal PVCs     Resting HR 67 bpm     Resting BP 146/70 mmHg     Max Ex. HR 104 bpm     Max Ex. BP 178/70 mmHg        Psychological, QOL, Others - Outcomes: PHQ 2/9: Depression screen PHQ 2/9 02/10/2015  Decreased Interest 0  Down, Depressed, Hopeless 0  PHQ - 2 Score 0  Altered sleeping 3  Tired, decreased energy 2  Change in appetite 1  Feeling bad or  failure about yourself  0  Trouble concentrating 0  Moving slowly or fidgety/restless 0  Suicidal thoughts 0  PHQ-9 Score 6  Difficult doing work/chores Not difficult at all    Quality of Life:     Quality of Life - 02/10/15 1540    Quality of Life Scores   Health/Function Pre 25.07 %   Socioeconomic Pre 30 %   Psych/Spiritual Pre 29.14 %   Family Pre 28.8 %   GLOBAL Pre 27.55 %      Personal Goals: Goals established at orientation with interventions provided to work toward goal.     Personal Goals and Risk Factors at Admission - 02/10/15 1521    Core Components/Risk Factors/Patient Goals on Admission   Sedentary Yes   Intervention (read-only) While in program, learn and follow the exercise prescription taught. Start at a low level workload and increase workload after able to maintain previous level for 30 minutes. Increase time before increasing intensity.   Diabetes Yes   Goal Blood glucose control identified by blood glucose values, HgbA1C. Participant verbalizes understanding of the signs/symptoms of hyper/hypo glycemia, proper foot care and importance of medication and nutrition plan for blood glucose control.   Intervention (read-only) Provide nutrition & aerobic exercise along with prescribed medications to achieve blood  glucose in normal ranges: Fasting 65-99 mg/dL   Hypertension Yes   Goal Participant will see blood pressure controlled within the values of 140/41mm/Hg or within value directed by their physician.   Intervention (read-only) Provide nutrition & aerobic exercise along with prescribed medications to achieve BP 140/90 or less.   Lipids Yes   Goal Cholesterol controlled with medications as prescribed, with individualized exercise RX and with personalized nutrition plan. Value goals: LDL < 70mg , HDL > 40mg . Participant states understanding of desired cholesterol values and following prescriptions.   Intervention (read-only) Provide nutrition & aerobic exercise along with prescribed medications to achieve LDL 70mg , HDL >40mg .   Take Less Medication Yes   Intervention Learn your risk factors and begin the lifestyle modifications for risk factor control during your time in the program.   Understand more about Heart/Pulmonary Disease. Yes   Intervention While in program utilize professionals for any questions, and attend the education sessions. Great websites to use are www.americanheart.org or www.lung.org for reliable information.       Personal Goals Discharge:     Goals and Risk Factor Review      04/05/15 1124           Core Components/Risk Factors/Patient Goals Review   Review I called Brent Glover and he said he doesn't feel he can do Cardiac Rehab since his joints hurt too bad. He said his back and legs hurt also.           Nutrition & Weight - Outcomes:      Post Biometrics - 02/10/15 1501     Post  Biometrics   Height 5' 7.75" (1.721 m)   Weight 199 lb (90.266 kg)   Waist Circumference 42.25 inches   Hip Circumference 44.5 inches   Waist to Hip Ratio 0.95 %   BMI (Calculated) 30.5      Nutrition:   Nutrition Discharge:   Education Questionnaire Score:     Knowledge Questionnaire Score - 02/10/15 1541    Knowledge Questionnaire Score   Pre Score 22/28       Goals reviewed with patient; copy given to patient.

## 2015-04-05 NOTE — Telephone Encounter (Signed)
I called Brent Glover and he said he doesn't feel he can do Cardiac Rehab since his joints hurt too bad. He said his back and legs hurt also.

## 2015-04-07 ENCOUNTER — Ambulatory Visit: Payer: Medicare Other

## 2015-04-08 ENCOUNTER — Ambulatory Visit: Payer: Medicare Other

## 2015-04-12 ENCOUNTER — Ambulatory Visit: Payer: Medicare Other

## 2015-04-14 ENCOUNTER — Ambulatory Visit: Payer: Medicare Other

## 2015-04-15 ENCOUNTER — Ambulatory Visit: Payer: Medicare Other

## 2015-04-19 ENCOUNTER — Ambulatory Visit: Payer: Medicare Other

## 2015-04-21 ENCOUNTER — Ambulatory Visit: Payer: Medicare Other

## 2015-04-22 ENCOUNTER — Ambulatory Visit: Payer: Medicare Other

## 2015-04-26 ENCOUNTER — Ambulatory Visit: Payer: Medicare Other

## 2015-04-28 ENCOUNTER — Ambulatory Visit: Payer: Medicare Other

## 2015-04-29 ENCOUNTER — Ambulatory Visit: Payer: Medicare Other

## 2015-05-03 ENCOUNTER — Ambulatory Visit: Payer: Medicare Other

## 2015-05-05 ENCOUNTER — Ambulatory Visit: Payer: Medicare Other

## 2015-05-06 ENCOUNTER — Ambulatory Visit: Payer: Medicare Other

## 2015-05-10 ENCOUNTER — Ambulatory Visit: Payer: Medicare Other

## 2017-09-29 ENCOUNTER — Emergency Department: Payer: Medicare Other

## 2017-09-29 ENCOUNTER — Inpatient Hospital Stay
Admission: EM | Admit: 2017-09-29 | Discharge: 2017-10-03 | DRG: 811 | Disposition: A | Discharge status: Home / Self Care | Payer: Medicare Other | Attending: Internal Medicine | Admitting: Internal Medicine

## 2017-09-29 ENCOUNTER — Other Ambulatory Visit: Payer: Self-pay

## 2017-09-29 ENCOUNTER — Encounter: Payer: Self-pay | Admitting: Intensive Care

## 2017-09-29 DIAGNOSIS — E785 Hyperlipidemia, unspecified: Secondary | ICD-10-CM | POA: Diagnosis present

## 2017-09-29 DIAGNOSIS — Z888 Allergy status to other drugs, medicaments and biological substances status: Secondary | ICD-10-CM

## 2017-09-29 DIAGNOSIS — R296 Repeated falls: Secondary | ICD-10-CM | POA: Diagnosis present

## 2017-09-29 DIAGNOSIS — Z992 Dependence on renal dialysis: Secondary | ICD-10-CM

## 2017-09-29 DIAGNOSIS — D649 Anemia, unspecified: Secondary | ICD-10-CM

## 2017-09-29 DIAGNOSIS — D62 Acute posthemorrhagic anemia: Secondary | ICD-10-CM | POA: Diagnosis not present

## 2017-09-29 DIAGNOSIS — K219 Gastro-esophageal reflux disease without esophagitis: Secondary | ICD-10-CM | POA: Diagnosis present

## 2017-09-29 DIAGNOSIS — E1122 Type 2 diabetes mellitus with diabetic chronic kidney disease: Secondary | ICD-10-CM | POA: Diagnosis present

## 2017-09-29 DIAGNOSIS — D631 Anemia in chronic kidney disease: Secondary | ICD-10-CM | POA: Diagnosis present

## 2017-09-29 DIAGNOSIS — J449 Chronic obstructive pulmonary disease, unspecified: Secondary | ICD-10-CM | POA: Diagnosis present

## 2017-09-29 DIAGNOSIS — N400 Enlarged prostate without lower urinary tract symptoms: Secondary | ICD-10-CM | POA: Diagnosis present

## 2017-09-29 DIAGNOSIS — N2581 Secondary hyperparathyroidism of renal origin: Secondary | ICD-10-CM | POA: Diagnosis present

## 2017-09-29 DIAGNOSIS — Z87891 Personal history of nicotine dependence: Secondary | ICD-10-CM

## 2017-09-29 DIAGNOSIS — D500 Iron deficiency anemia secondary to blood loss (chronic): Secondary | ICD-10-CM

## 2017-09-29 DIAGNOSIS — I953 Hypotension of hemodialysis: Secondary | ICD-10-CM | POA: Diagnosis present

## 2017-09-29 DIAGNOSIS — I132 Hypertensive heart and chronic kidney disease with heart failure and with stage 5 chronic kidney disease, or end stage renal disease: Secondary | ICD-10-CM | POA: Diagnosis present

## 2017-09-29 DIAGNOSIS — R531 Weakness: Secondary | ICD-10-CM | POA: Diagnosis present

## 2017-09-29 DIAGNOSIS — N186 End stage renal disease: Secondary | ICD-10-CM | POA: Diagnosis present

## 2017-09-29 DIAGNOSIS — Z885 Allergy status to narcotic agent status: Secondary | ICD-10-CM

## 2017-09-29 DIAGNOSIS — N189 Chronic kidney disease, unspecified: Secondary | ICD-10-CM | POA: Insufficient documentation

## 2017-09-29 DIAGNOSIS — I5032 Chronic diastolic (congestive) heart failure: Secondary | ICD-10-CM | POA: Diagnosis present

## 2017-09-29 DIAGNOSIS — I2510 Atherosclerotic heart disease of native coronary artery without angina pectoris: Secondary | ICD-10-CM | POA: Diagnosis present

## 2017-09-29 DIAGNOSIS — Z7982 Long term (current) use of aspirin: Secondary | ICD-10-CM

## 2017-09-29 DIAGNOSIS — Z951 Presence of aortocoronary bypass graft: Secondary | ICD-10-CM

## 2017-09-29 DIAGNOSIS — R55 Syncope and collapse: Secondary | ICD-10-CM | POA: Diagnosis present

## 2017-09-29 DIAGNOSIS — Z79899 Other long term (current) drug therapy: Secondary | ICD-10-CM

## 2017-09-29 DIAGNOSIS — E875 Hyperkalemia: Secondary | ICD-10-CM | POA: Diagnosis not present

## 2017-09-29 HISTORY — DX: End stage renal disease: N18.6

## 2017-09-29 LAB — CBC
HEMATOCRIT: 21.5 % — AB (ref 40.0–52.0)
Hemoglobin: 7.3 g/dL — ABNORMAL LOW (ref 13.0–18.0)
MCH: 32.8 pg (ref 26.0–34.0)
MCHC: 33.8 g/dL (ref 32.0–36.0)
MCV: 97.1 fL (ref 80.0–100.0)
Platelets: 110 10*3/uL — ABNORMAL LOW (ref 150–440)
RBC: 2.21 MIL/uL — ABNORMAL LOW (ref 4.40–5.90)
RDW: 18.5 % — AB (ref 11.5–14.5)
WBC: 8.9 10*3/uL (ref 3.8–10.6)

## 2017-09-29 LAB — COMPREHENSIVE METABOLIC PANEL
ALK PHOS: 64 U/L (ref 38–126)
ALT: 11 U/L (ref 0–44)
AST: 17 U/L (ref 15–41)
Albumin: 3 g/dL — ABNORMAL LOW (ref 3.5–5.0)
Anion gap: 10 (ref 5–15)
BILIRUBIN TOTAL: 0.2 mg/dL — AB (ref 0.3–1.2)
BUN: 98 mg/dL — ABNORMAL HIGH (ref 8–23)
CO2: 29 mmol/L (ref 22–32)
CREATININE: 5.25 mg/dL — AB (ref 0.61–1.24)
Calcium: 9.4 mg/dL (ref 8.9–10.3)
Chloride: 99 mmol/L (ref 98–111)
GFR, EST AFRICAN AMERICAN: 11 mL/min — AB (ref >60)
GFR, EST NON AFRICAN AMERICAN: 9 mL/min — AB (ref >60)
Glucose, Bld: 158 mg/dL — ABNORMAL HIGH (ref 70–99)
Potassium: 5.1 mmol/L (ref 3.5–5.1)
Sodium: 138 mmol/L (ref 135–145)
TOTAL PROTEIN: 6.1 g/dL — AB (ref 6.5–8.1)

## 2017-09-29 LAB — ABO/RH: ABO/RH(D): O POS

## 2017-09-29 LAB — PHOSPHORUS: Phosphorus: 1.6 mg/dL — ABNORMAL LOW (ref 2.5–4.6)

## 2017-09-29 LAB — GLUCOSE, CAPILLARY
GLUCOSE-CAPILLARY: 148 mg/dL — AB (ref 70–99)
Glucose-Capillary: 104 mg/dL — ABNORMAL HIGH (ref 70–99)
Glucose-Capillary: 116 mg/dL — ABNORMAL HIGH (ref 70–99)

## 2017-09-29 LAB — RETICULOCYTES
RBC.: 2.17 MIL/uL — AB (ref 4.40–5.90)
RETIC CT PCT: 4.5 % — AB (ref 0.4–3.1)
Retic Count, Absolute: 97.7 10*3/uL (ref 19.0–183.0)

## 2017-09-29 LAB — IRON AND TIBC
Iron: 49 ug/dL (ref 45–182)
SATURATION RATIOS: 25 % (ref 17.9–39.5)
TIBC: 193 ug/dL — ABNORMAL LOW (ref 250–450)
UIBC: 144 ug/dL

## 2017-09-29 LAB — FERRITIN: Ferritin: 753 ng/mL — ABNORMAL HIGH (ref 24–336)

## 2017-09-29 LAB — FOLATE: Folate: 6.8 ng/mL (ref >5.9)

## 2017-09-29 LAB — BRAIN NATRIURETIC PEPTIDE: B NATRIURETIC PEPTIDE 5: 791 pg/mL — AB (ref 0.0–100.0)

## 2017-09-29 LAB — PREPARE RBC (CROSSMATCH)

## 2017-09-29 LAB — TROPONIN I: Troponin I: 0.05 ng/mL (ref <0.03)

## 2017-09-29 LAB — LIPASE, BLOOD: LIPASE: 39 U/L (ref 11–51)

## 2017-09-29 MED ORDER — SODIUM CHLORIDE 0.9% IV SOLUTION: Freq: Once | INTRAVENOUS | Status: DC

## 2017-09-29 MED ORDER — ONDANSETRON HCL 4 MG PO TABS: 4.0000 mg | ORAL_TABLET | Freq: Four times a day (QID) | ORAL | Status: DC | PRN

## 2017-09-29 MED ORDER — INSULIN ASPART 100 UNIT/ML ~~LOC~~ SOLN
0.0000 [IU] | Freq: Three times a day (TID) | SUBCUTANEOUS | Status: DC
  Administered 2017-10-01: 1 [IU] via SUBCUTANEOUS
  Filled 2017-09-29: qty 1

## 2017-09-29 MED ORDER — SODIUM CHLORIDE 0.9% FLUSH
3.0000 mL | Freq: Two times a day (BID) | INTRAVENOUS | Status: DC
  Administered 2017-09-29 – 2017-10-03 (×8): 3 mL via INTRAVENOUS

## 2017-09-29 MED ORDER — ACETAMINOPHEN 500 MG PO TABS: 500.0000 mg | ORAL_TABLET | Freq: Four times a day (QID) | ORAL | Status: DC | PRN

## 2017-09-29 MED ORDER — MIDODRINE HCL 5 MG PO TABS
10.0000 mg | ORAL_TABLET | ORAL | Status: DC
  Administered 2017-10-02: 10 mg via ORAL

## 2017-09-29 MED ORDER — CARVEDILOL 6.25 MG PO TABS
6.2500 mg | ORAL_TABLET | Freq: Two times a day (BID) | ORAL | Status: DC
  Administered 2017-09-30 – 2017-10-03 (×6): 6.25 mg via ORAL
  Filled 2017-09-29 (×7): qty 1

## 2017-09-29 MED ORDER — AMLODIPINE BESYLATE 5 MG PO TABS
5.0000 mg | ORAL_TABLET | Freq: Every day | ORAL | Status: DC
  Administered 2017-09-30 – 2017-10-01 (×2): 5 mg via ORAL
  Filled 2017-09-29 (×2): qty 1

## 2017-09-29 MED ORDER — MELATONIN 5 MG PO TABS
5.0000 mg | ORAL_TABLET | Freq: Every day | ORAL | Status: DC
  Administered 2017-09-29 – 2017-10-02 (×4): 5 mg via ORAL
  Filled 2017-09-29 (×5): qty 1

## 2017-09-29 MED ORDER — MIDODRINE HCL 5 MG PO TABS
5.0000 mg | ORAL_TABLET | ORAL | Status: DC
  Administered 2017-10-02: 5 mg via ORAL
  Filled 2017-09-29: qty 1

## 2017-09-29 MED ORDER — IOPAMIDOL (ISOVUE-370) INJECTION 76%: 100.0000 mL | Freq: Once | INTRAVENOUS | Status: DC | PRN

## 2017-09-29 MED ORDER — SODIUM CHLORIDE 0.9 % IV SOLN: 250.0000 mL | INTRAVENOUS | Status: DC | PRN

## 2017-09-29 MED ORDER — FLUTICASONE PROPIONATE 50 MCG/ACT NA SUSP
2.0000 | Freq: Every day | NASAL | Status: DC | PRN
  Filled 2017-09-29 (×2): qty 16

## 2017-09-29 MED ORDER — VITAMIN D (ERGOCALCIFEROL) 1.25 MG (50000 UNIT) PO CAPS: 50000.0000 [IU] | ORAL_CAPSULE | ORAL | Status: DC

## 2017-09-29 MED ORDER — PANTOPRAZOLE SODIUM 40 MG PO TBEC
40.0000 mg | DELAYED_RELEASE_TABLET | Freq: Every day | ORAL | Status: DC
  Administered 2017-09-29 – 2017-10-03 (×4): 40 mg via ORAL
  Filled 2017-09-29 (×4): qty 1

## 2017-09-29 MED ORDER — ACETAMINOPHEN 650 MG RE SUPP: 650.0000 mg | Freq: Four times a day (QID) | RECTAL | Status: DC | PRN

## 2017-09-29 MED ORDER — ONDANSETRON HCL 4 MG/2ML IJ SOLN
4.0000 mg | Freq: Four times a day (QID) | INTRAMUSCULAR | Status: DC | PRN
  Administered 2017-09-30 – 2017-10-02 (×2): 4 mg via INTRAVENOUS
  Filled 2017-09-29 (×2): qty 2

## 2017-09-29 MED ORDER — MAGNESIUM OXIDE 400 (241.3 MG) MG PO TABS
400.0000 mg | ORAL_TABLET | Freq: Every day | ORAL | Status: DC
  Administered 2017-09-29 – 2017-10-02 (×4): 400 mg via ORAL
  Filled 2017-09-29 (×4): qty 1

## 2017-09-29 MED ORDER — ONDANSETRON HCL 4 MG/2ML IJ SOLN
4.0000 mg | Freq: Once | INTRAMUSCULAR | Status: AC
  Administered 2017-09-29: 4 mg via INTRAVENOUS
  Filled 2017-09-29: qty 2

## 2017-09-29 MED ORDER — ASPIRIN EC 81 MG PO TBEC
81.0000 mg | DELAYED_RELEASE_TABLET | Freq: Every day | ORAL | Status: DC
  Administered 2017-09-29 – 2017-09-30 (×2): 81 mg via ORAL
  Filled 2017-09-29 (×2): qty 1

## 2017-09-29 MED ORDER — ACETAMINOPHEN 325 MG PO TABS
650.0000 mg | ORAL_TABLET | Freq: Four times a day (QID) | ORAL | Status: DC | PRN
  Administered 2017-09-30 – 2017-10-02 (×5): 650 mg via ORAL
  Filled 2017-09-29 (×5): qty 2

## 2017-09-29 MED ORDER — ATORVASTATIN CALCIUM 80 MG PO TABS
80.0000 mg | ORAL_TABLET | Freq: Every day | ORAL | Status: DC
  Administered 2017-09-29 – 2017-10-02 (×4): 80 mg via ORAL
  Filled 2017-09-29 (×2): qty 1
  Filled 2017-09-29: qty 4
  Filled 2017-09-29: qty 1
  Filled 2017-09-29 (×2): qty 4
  Filled 2017-09-29 (×2): qty 1

## 2017-09-29 MED ORDER — SPIRONOLACTONE 25 MG PO TABS
12.5000 mg | ORAL_TABLET | Freq: Every day | ORAL | Status: DC
  Administered 2017-09-30 – 2017-10-01 (×2): 12.5 mg via ORAL
  Filled 2017-09-29 (×2): qty 0.5
  Filled 2017-09-29 (×2): qty 1
  Filled 2017-09-29 (×2): qty 0.5
  Filled 2017-09-29: qty 1

## 2017-09-29 MED ORDER — ROPINIROLE HCL 0.25 MG PO TABS
0.5000 mg | ORAL_TABLET | Freq: Every day | ORAL | Status: DC
  Administered 2017-09-29 – 2017-10-02 (×4): 0.5 mg via ORAL
  Filled 2017-09-29 (×5): qty 2

## 2017-09-29 MED ORDER — TAMSULOSIN HCL 0.4 MG PO CAPS
0.4000 mg | ORAL_CAPSULE | Freq: Every day | ORAL | Status: DC
  Administered 2017-09-29 – 2017-10-02 (×4): 0.4 mg via ORAL
  Filled 2017-09-29 (×4): qty 1

## 2017-09-29 MED ORDER — FERROUS SULFATE 325 (65 FE) MG PO TABS
325.0000 mg | ORAL_TABLET | Freq: Every day | ORAL | Status: DC
  Administered 2017-09-30 – 2017-10-03 (×4): 325 mg via ORAL
  Filled 2017-09-29 (×4): qty 1

## 2017-09-29 MED ORDER — SODIUM CHLORIDE 0.9% FLUSH: 3.0000 mL | INTRAVENOUS | Status: DC | PRN

## 2017-09-29 MED ORDER — ESCITALOPRAM OXALATE 10 MG PO TABS
20.0000 mg | ORAL_TABLET | Freq: Every day | ORAL | Status: DC
  Administered 2017-09-29 – 2017-10-02 (×4): 20 mg via ORAL
  Filled 2017-09-29 (×5): qty 2

## 2017-09-29 NOTE — H&P (Signed)
[facility] [place] NAME: Brent Glover    MR#:  [id number]  DATE OF BIRTH:  1936/01/09  DATE OF ADMISSION:  09/29/2017  PRIMARY CARE PHYSICIAN: Christen Bame, DO   REQUESTING/REFERRING PHYSICIAN: Darel Hong, MD  CHIEF COMPLAINT:   Chief Complaint  Patient presents with  . Loss of Consciousness    HISTORY OF PRESENT ILLNESS: Brent Glover  is a 82 y.o. male with a known history of BPH, chronic diastolic CHF, chronic kidney disease stage III, COPD, diabetes type 2, end-stage renal disease on hemodialysis who is presenting with complaint of generalized weakness and fall times twice.  He reports that this happened since yesterday.  Patient states that he was supposed to have dialysis today and has not had a hemodialysis.  Patient in the ER was noted to have worsening anemia.  He does have a history of low blood pressure with hemodialysis.  Patient has required transfusions in the past.  He denies any chest pain or palpitations.     PAST MEDICAL HISTORY:   Past Medical History:  Diagnosis Date  . BPH (benign prostatic hyperplasia)   . Chronic diastolic heart failure ([place])   . Chronic kidney disease    stage III  . COPD (chronic obstructive pulmonary disease) ([place])   . Diabetes mellitus without complication ([place])   . Hyperlipidemia   . Hypertension     PAST SURGICAL HISTORY:  Past Surgical History:  Procedure Laterality Date  . ABDOMINAL AORTIC ENDOVASCULAR STENT GRAFT    . CYSTOSCOPY    . KNEE ARTHROPLASTY      SOCIAL HISTORY:  Social History   Tobacco Use  . Smoking status: Former Smoker    Packs/day: 1.00    Years: 60.00    Pack years: 60.00    Types: Cigarettes    Last attempt to quit: 10/11/2014    Years since quitting: 2.9  . Smokeless tobacco: Never Used  Substance Use Topics  . Alcohol use: No    FAMILY HISTORY: History reviewed. No pertinent family history.  DRUG ALLERGIES:  Allergies  Allergen  Reactions  . Montelukast Shortness Of Breath  . Trazodone Other (See Comments)    "it hypes me up instead of putting me to sleep"  . Hydrocodone Nausea And Vomiting    REVIEW OF SYSTEMS:   CONSTITUTIONAL: No fever, positive fatigue or positive weakness.  EYES: No blurred or double vision.  EARS, NOSE, AND THROAT: No tinnitus or ear pain.  RESPIRATORY: No cough, shortness of breath, wheezing or hemoptysis.  CARDIOVASCULAR: No chest pain, orthopnea, edema.  GASTROINTESTINAL: No nausea, vomiting, diarrhea or abdominal pain.  GENITOURINARY: No dysuria, hematuria.  ENDOCRINE: No polyuria, nocturia,  HEMATOLOGY: No anemia, easy bruising or bleeding SKIN: No rash or lesion. MUSCULOSKELETAL: No joint pain or arthritis.   NEUROLOGIC: No tingling, numbness, weakness.  PSYCHIATRY: No anxiety or depression.   MEDICATIONS AT HOME:  Prior to Admission medications   Medication Sig Start Date End Date Taking? Authorizing Provider  acetaminophen (TYLENOL) 500 MG tablet Take 500 mg by mouth every 6 (six) hours as needed for mild pain.   Yes [provider]  aspirin 81 MG tablet Take 81 mg by mouth daily.   Yes [provider]  atorvastatin (LIPITOR) 80 MG tablet Take 80 mg by mouth daily. 08/17/17  Yes [provider]  carvedilol (COREG) 6.25 MG tablet Take 6.25 mg by mouth 2 (two) times daily with a meal.   Yes  [provider]  ergocalciferol (VITAMIN D2) 50000 units capsule Take 50,000 Units by mouth every Friday.   Yes [provider]  escitalopram (LEXAPRO) 10 MG tablet Take 20 mg by mouth daily.  12/21/14 09/30/18 Yes [provider]  ferrous sulfate 325 (65 FE) MG tablet Take 325 mg by mouth daily with breakfast.   Yes [provider]  fluticasone (FLONASE) 50 MCG/ACT nasal spray Place 2 sprays into both nostrils daily as needed for allergies. 07/03/17  Yes [provider]  lisinopril (PRINIVIL,ZESTRIL) 10 MG tablet Take 10 mg  by mouth at bedtime.  11/24/14 09/30/18 Yes [provider]  magnesium oxide (MAG-OX) 400 MG tablet Take 400 mg by mouth daily. 03/06/17 03/06/18 Yes [provider]  Melatonin 5 MG TABS Take 5 mg by mouth at bedtime.   Yes [provider]  midodrine (PROAMATINE) 5 MG tablet Take 5-10 mg by mouth Every Tuesday,Thursday,and Saturday with dialysis. Give 10 mg by mouth 30 minutes before dialysis and 5 mg by mouth half way through dialysis.   Yes [provider]  pantoprazole (PROTONIX) 40 MG tablet Take 40 mg by mouth daily.   Yes [provider]  rOPINIRole (REQUIP) 0.25 MG tablet Take 0.5 mg by mouth at bedtime. Reported on 02/10/2015   Yes [provider]  spironolactone (ALDACTONE) 25 MG tablet Take 12.5 mg by mouth at bedtime.   Yes [provider]  tamsulosin (FLOMAX) 0.4 MG CAPS Take 0.4 mg by mouth daily.   Yes [provider]  amLODipine (NORVASC) 10 MG tablet Take 0.5 tablets (5 mg total) by mouth daily. Patient not taking: Reported on 09/29/2017 06/25/12   Wellington Hampshire, MD  carvedilol (COREG) 25 MG tablet Take 0.5 tablets (12.5 mg total) by mouth 2 (two) times daily with a meal. Patient not taking: Reported on 09/29/2017 06/25/12   Wellington Hampshire, MD  furosemide (LASIX) 20 MG tablet Take 1 tablet (20 mg total) by mouth daily. Patient not taking: Reported on 09/29/2017 06/27/12   Wellington Hampshire, MD      PHYSICAL EXAMINATION:   VITAL SIGNS: Blood pressure 107/63, pulse 84, temperature 97.9 F (36.6 C), temperature source Oral, resp. rate 16, height 5' 7.5" (1.715 m), weight 73 kg, SpO2 97 %.  GENERAL:  82 y.o.-year-old patient lying in the bed with no acute distress.  EYES: Pupils equal, round, reactive to light and accommodation. No scleral icterus. Extraocular muscles intact.  HEENT: Head atraumatic, normocephalic. Oropharynx and nasopharynx clear.  NECK:  Supple, no jugular venous distention. No thyroid  enlargement, no tenderness.  LUNGS: Normal breath sounds bilaterally, no wheezing, rales,rhonchi or crepitation. No use of accessory muscles of respiration.  CARDIOVASCULAR: S1, S2 normal. No murmurs, rubs, or gallops.  ABDOMEN: Soft, nontender, nondistended. Bowel sounds present. No organomegaly or mass.  EXTREMITIES: No pedal edema, cyanosis, or clubbing.  NEUROLOGIC: Cranial nerves II through XII are intact. Muscle strength 5/5 in all extremities. Sensation intact. Gait not checked.  PSYCHIATRIC: The patient is alert and oriented x 3.  SKIN: No obvious rash, lesion, or ulcer.   LABORATORY PANEL:   CBC Recent Labs  Lab 09/29/17 1120  WBC 8.9  HGB 7.3*  HCT 21.5*  PLT 110*  MCV 97.1  MCH 32.8  MCHC 33.8  RDW 18.5*   ------------------------------------------------------------------------------------------------------------------  Chemistries  Recent Labs  Lab 09/29/17 1120  NA 138  K 5.1  CL 99  CO2 29  GLUCOSE 158*  BUN 98*  CREATININE 5.25*  CALCIUM 9.4  AST 17  ALT 11  ALKPHOS 64  BILITOT 0.2*   ------------------------------------------------------------------------------------------------------------------ estimated creatinine clearance is 10.9 mL/min (A) (by C-G formula based on SCr of 5.25 mg/dL (H)). ------------------------------------------------------------------------------------------------------------------ No results for input(s): TSH, T4TOTAL, T3FREE, THYROIDAB in the last 72 hours.  Invalid input(s): FREET3   Coagulation profile No results for input(s): INR, PROTIME in the last 168 hours. ------------------------------------------------------------------------------------------------------------------- No results for input(s): DDIMER in the last 72 hours. -------------------------------------------------------------------------------------------------------------------  Cardiac Enzymes Recent Labs  Lab 09/29/17 1120  TROPONINI 0.05*    ------------------------------------------------------------------------------------------------------------------ Invalid input(s): POCBNP  ---------------------------------------------------------------------------------------------------------------  Urinalysis No results found for: COLORURINE, APPEARANCEUR, LABSPEC, PHURINE, GLUCOSEU, HGBUR, BILIRUBINUR, KETONESUR, PROTEINUR, UROBILINOGEN, NITRITE, LEUKOCYTESUR   RADIOLOGY: Ct Abdomen Pelvis Wo Contrast  Result Date: 09/29/2017 CLINICAL DATA:  Abdominal aortic aneurysm.  Preop. EXAM: CT ABDOMEN AND PELVIS WITHOUT CONTRAST TECHNIQUE: Multidetector CT imaging of the abdomen and pelvis was performed following the standard protocol without IV contrast. COMPARISON:  None. FINDINGS: Lower chest: Minimal dependent atelectasis. Mild wall thickening of the distal esophagus. Coronary artery calcifications. Pericardial calcifications are also noted along the lateral aspect of the left ventricle. There are nonspecific calcifications at the edge of the right atrium. Hepatobiliary: Unremarkable liver. Multiple gallstones are noted. Gallbladder is decompressed. Pancreas: Unremarkable Spleen: Unremarkable Adrenals/Urinary Tract: 1.6 cm left adrenal nodule on image 24. It is nonspecific. Small renal cysts. There are calcifications within the right kidney that are nonspecific. Other hypodensities in the kidneys are too small to characterize. Unremarkable bladder. Stomach/Bowel: Stomach is decompressed. Normal appendix. No obvious mass in the colon. Diverticulosis of the descending and sigmoid colon. There is no evidence of small-bowel obstruction. Vascular/Lymphatic: Aorto bi-iliac stent graft, excluding an aortic aneurysm, is in place. Contrast was not administered for this study and patency cannot be assessed. Endoleak cannot be assessed. Maximal AP and transverse diameters of the aortic aneurysm sac are 5.3 and 5.3 cm. Reproductive: Unremarkable prostate.  Other: No free fluid.  No evidence of retroperitoneal hemorrhage. Musculoskeletal: There is no vertebral compression deformity. Facet arthropathy in the lumbar spine is noted. IMPRESSION: Noncontrast study demonstrates an aorto bi-iliac stent graft which excludes an aortic aneurysm. Maximal aneurysm sac diameter is 5.3 cm. Nonspecific left adrenal nodule. Cholelithiasis. Electronically Signed   By: Marybelle Killings M.D.   On: 09/29/2017 12:33   Dg Elbow Complete Left  Result Date: 09/29/2017 CLINICAL DATA:  Left elbow injury after fall today. EXAM: LEFT ELBOW - COMPLETE 3+ VIEW COMPARISON:  None. FINDINGS: There is no evidence of fracture, dislocation, or joint effusion. There is no evidence of arthropathy. Moderate spurring of olecranon is noted. Soft tissues are unremarkable. IMPRESSION: No acute abnormality seen in the left elbow. Electronically Signed   By: Marijo Conception, M.D.   On: 09/29/2017 11:45   Dg Chest Port 1 View  Result Date: 09/29/2017 CLINICAL DATA:  Fall today. EXAM: PORTABLE CHEST 1 VIEW COMPARISON:  Radiographs of Jun 17, 2012. FINDINGS: Stable cardiomediastinal silhouette. Atherosclerosis of thoracic aorta is noted. Interval placement of right internal jugular dialysis catheter with distal tip in expected position of cavoatrial junction. No pneumothorax or pleural effusion is noted. Both lungs are clear. The visualized skeletal structures are unremarkable. IMPRESSION: Interval placement of right internal jugular dialysis catheter with distal tip in expected position of cavoatrial junction. No acute cardiopulmonary abnormality seen. Electronically Signed   By: Marijo Conception, M.D.   On: 09/29/2017 11:47   Dg Hand Complete Left  Result Date: 09/29/2017 CLINICAL DATA:  Left  hand injury after fall today. EXAM: LEFT HAND - COMPLETE 3+ VIEW COMPARISON:  None. FINDINGS: There is no evidence of fracture or dislocation. There is no evidence of arthropathy or other focal bone abnormality. Soft  tissues are unremarkable. IMPRESSION: No significant abnormality seen in the left hand. Electronically Signed   By: Marijo Conception, M.D.   On: 09/29/2017 11:44    EKG: Orders placed or performed during the hospital encounter of 09/29/17  . ED EKG  . ED EKG  . EKG 12-Lead  . EKG 12-Lead    IMPRESSION AND PLAN: Patient is a 82 year old presenting with generalized weakness noted to have worsening anemia  1.  Generalized weakness likely due to symptomatic anemia We will obtain anemia panel I have consented patient to be transfused 1 unit of packed RBCs he is agreeable to transfusion risk and benefits explained PT evaluate Obtain orthostatics Also due to concern of patient having syncope will obtain echo of the heart  2. essential hypertension continue Coreg as well as amlodipine  3.  History of congestive heart failure continue spironolactone and Coreg  4. . Diabetes type 2 Place patient on sliding scale insulin  5.  GERD continue PPIs  6.  SCDs for DVT prophylaxis  All the records are reviewed and case discussed with ED provider. Management plans discussed with the patient, family and they are in agreement.  CODE STATUS: Full    TOTAL TIME TAKING CARE OF THIS PATIENT: 55 minutes.    Dustin Flock M.D on 09/29/2017 at 1:25 PM  Between 7am to 6pm - Pager - [phone]  After 6pm go to [email or web address] - Proofreader  [facility]  [phone]  CC: Primary care physician; Christen Bame, DO

## 2017-09-29 NOTE — ED Triage Notes (Addendum)
Wife witnessed syncopal episode at home. Patient was in kitchen and felt weak and was going to sit down and the next thing he knew he had passed out and in the floor. Multiple skin tears present on both arm. Tumor present on L side jaw. Patient A&O x4 upon arrival to ER. Dialysis patient. Access present in R chest. EMS IV was infiltrated upon arrival to ER that was 18G in L AC. IV removed

## 2017-09-29 NOTE — ED Provider Notes (Signed)
[facility] Emergency Department Provider Note  ____________________________________________   First MD Initiated Contact with Patient 09/29/17 1100     (approximate)  I have reviewed the triage vital signs and the nursing notes.   HISTORY  Chief Complaint Loss of Consciousness    HPI Brent Glover is a 82 y.o. male who comes the emergency department via EMS after multiple syncopal events while at home today.  The patient is a complex past medical history including coronary artery disease, AAA that is been stented, COPD, diabetes mellitus, and end-stage renal disease requiring new dialysis.  He was last dialyzed 2 days ago and was scheduled for this morning.  Today he got up to go to the kitchen and he felt generally "weak" and collapsed down to the ground.  He does not think he lost consciousness completely.  He has fallen multiple times today.  He is suffered skin tears to his left wrist and left elbow.  His symptoms are sudden onset severe positional.  They are improved when sitting down.    Past Medical History:  Diagnosis Date  . BPH (benign prostatic hyperplasia)   . Chronic diastolic heart failure ([place])   . COPD (chronic obstructive pulmonary disease) ([place])   . Diabetes mellitus without complication ([place])   . ESRD (end stage renal disease) ([place])    stage III  . Hyperlipidemia   . Hypertension     Patient Active Problem List   Diagnosis Date Noted  . Acute on chronic anemia 09/30/2017  . Syncope and collapse 09/29/2017  . Chronic kidney disease   . CAD in native artery 11/23/2014  . H/O coronary artery bypass surgery 10/28/2014  . Acute non-ST elevation myocardial infarction (NSTEMI) ([place]) 10/15/2014  . Chronic kidney disease (CKD), stage III (moderate) ([place]) 03/17/2014  . Creatinine elevation 03/11/2014  . Chronic combined systolic and diastolic heart failure ([place]) 11/29/2012  . Claudication ([place]) 06/25/2012  . Chronic diastolic heart  failure ([place])   . Hypertension   . Type 2 diabetes mellitus ([place]) 09/21/2011  . Aneurysm ([place]) 10/07/2010  . Arthritis, degenerative 09/09/2010  . Hypercholesterolemia 04/04/2009  . Benign hypertension 09/04/2008    Past Surgical History:  Procedure Laterality Date  . ABDOMINAL AORTIC ENDOVASCULAR STENT GRAFT    . CYSTOSCOPY    . KNEE ARTHROPLASTY      Prior to Admission medications   Medication Sig Start Date End Date Taking? Authorizing Provider  acetaminophen (TYLENOL) 500 MG tablet Take 500 mg by mouth every 6 (six) hours as needed for mild pain.   Yes [provider]  aspirin 81 MG tablet Take 81 mg by mouth daily.   Yes [provider]  atorvastatin (LIPITOR) 80 MG tablet Take 80 mg by mouth daily. 08/17/17  Yes [provider]  carvedilol (COREG) 6.25 MG tablet Take 6.25 mg by mouth 2 (two) times daily with a meal.   Yes [provider]  ergocalciferol (VITAMIN D2) 50000 units capsule Take 50,000 Units by mouth every Friday.   Yes [provider]  escitalopram (LEXAPRO) 10 MG tablet Take 20 mg by mouth daily.  12/21/14 09/30/18 Yes [provider]  ferrous sulfate 325 (65 FE) MG tablet Take 325 mg by mouth daily with breakfast.   Yes [provider]  fluticasone (FLONASE) 50 MCG/ACT nasal spray Place 2 sprays into both nostrils daily as needed for allergies. 07/03/17  Yes [provider]  lisinopril (PRINIVIL,ZESTRIL) 10 MG tablet Take 10 mg by mouth at  bedtime.  11/24/14 09/30/18 Yes [provider]  magnesium oxide (MAG-OX) 400 MG tablet Take 400 mg by mouth daily. 03/06/17 03/06/18 Yes [provider]  Melatonin 5 MG TABS Take 5 mg by mouth at bedtime.   Yes [provider]  midodrine (PROAMATINE) 5 MG tablet Take 5-10 mg by mouth Every Tuesday,Thursday,and Saturday with dialysis. Give 10 mg by mouth 30 minutes before dialysis and 5 mg by mouth half way through dialysis.   Yes [provider]  pantoprazole (PROTONIX) 40 MG tablet Take 40 mg by mouth daily.   Yes [provider]  rOPINIRole (REQUIP) 0.25 MG tablet Take 0.5 mg by mouth at bedtime. Reported on 02/10/2015   Yes [provider]  spironolactone (ALDACTONE) 25 MG tablet Take 12.5 mg by mouth at bedtime.   Yes [provider]  tamsulosin (FLOMAX) 0.4 MG CAPS Take 0.4 mg by mouth daily.   Yes [provider]  amLODipine (NORVASC) 10 MG tablet Take 0.5 tablets (5 mg total) by mouth daily. Patient not taking: Reported on 09/29/2017 06/25/12   Wellington Hampshire, MD  carvedilol (COREG) 25 MG tablet Take 0.5 tablets (12.5 mg total) by mouth 2 (two) times daily with a meal. Patient not taking: Reported on 09/29/2017 06/25/12   Wellington Hampshire, MD  furosemide (LASIX) 20 MG tablet Take 1 tablet (20 mg total) by mouth daily. Patient not taking: Reported on 09/29/2017 06/27/12   Wellington Hampshire, MD    Allergies Montelukast; Trazodone; and Hydrocodone  History reviewed. No pertinent family history.  Social History Social History   Tobacco Use  . Smoking status: Former Smoker    Packs/day: 1.00    Years: 60.00    Pack years: 60.00    Types: Cigarettes    Last attempt to quit: 10/11/2014    Years since quitting: 2.9  . Smokeless tobacco: Never Used  Substance Use Topics  . Alcohol use: No  . Drug use: No    Review of Systems Constitutional: No fever/chills Eyes: No visual changes. ENT: No sore throat. Cardiovascular: Denies chest pain. Respiratory: Denies shortness of breath. Gastrointestinal: No abdominal pain.  Positive for nausea, no vomiting.  No diarrhea.  No constipation. Genitourinary: Negative for dysuria. Musculoskeletal: Negative for back pain. Skin: Positive for wound Neurological: Negative for headaches, focal weakness or numbness.   ____________________________________________   PHYSICAL EXAM:  VITAL SIGNS: ED Triage Vitals [09/29/17 1059]  Enc  Vitals Group     BP      Pulse      Resp      Temp      Temp src      SpO2      Weight 161 lb (73 kg)     Height 5' 7.5" (1.715 m)     Head Circumference      Peak Flow      Pain Score 6     Pain Loc      Pain Edu?      Excl. in [place]?     Constitutional: Alert and oriented x4 appears pale although nontoxic no diaphoresis Eyes: PERRL EOMI. midrange and brisk.  Conjunctival pallor Head: Atraumatic. Nose: No congestion/rhinnorhea. Mouth/Throat: No trismus Neck: No stridor.  No midline tenderness Cardiovascular: Normal rate, regular rhythm. Grossly normal heart sounds.  Good peripheral circulation. Respiratory: Normal respiratory effort.  No retractions. Lungs CTAB and moving good air Gastrointestinal: Soft nontender Musculoskeletal: No lower extremity edema   Neurologic:  Normal speech and language.  No gross focal neurologic deficits are appreciated. Skin: Skin tears to lateral left wrist and elbow Psychiatric: Mood and affect are normal. Speech and behavior are normal.    ____________________________________________   DIFFERENTIAL includes but not limited to  Cardiogenic syncope, vasovagal syncope, dehydration, electrolyte disturbance, fluid overload, intracerebral hemorrhage, anemia ____________________________________________   LABS (all labs ordered are listed, but only abnormal results are displayed)  Labs Reviewed  CBC - Abnormal; Notable for the following components:      Result Value   RBC 2.21 (*)    Hemoglobin 7.3 (*)    HCT 21.5 (*)    RDW 18.5 (*)    Platelets 110 (*)    All other components within normal limits  TROPONIN I - Abnormal; Notable for the following components:   Troponin I 0.05 (*)    All other components within normal limits  BRAIN NATRIURETIC PEPTIDE - Abnormal; Notable for the following components:   B Natriuretic Peptide 791.0 (*)    All other components within normal limits  COMPREHENSIVE METABOLIC PANEL - Abnormal; Notable for the  following components:   Glucose, Bld 158 (*)    BUN 98 (*)    Creatinine, Ser 5.25 (*)    Total Protein 6.1 (*)    Albumin 3.0 (*)    Total Bilirubin 0.2 (*)    GFR calc non Af Amer 9 (*)    GFR calc Af Amer 11 (*)    All other components within normal limits  GLUCOSE, CAPILLARY - Abnormal; Notable for the following components:   Glucose-Capillary 148 (*)    All other components within normal limits  IRON AND TIBC - Abnormal; Notable for the following components:   TIBC 193 (*)    All other components within normal limits  FERRITIN - Abnormal; Notable for the following components:   Ferritin 753 (*)    All other components within normal limits  RETICULOCYTES - Abnormal; Notable for the following components:   Retic Ct Pct 4.5 (*)    RBC. 2.17 (*)    All other components within normal limits  GLUCOSE, CAPILLARY - Abnormal; Notable for the following components:   Glucose-Capillary 116 (*)    All other components within normal limits  CBC - Abnormal; Notable for the following components:   RBC 1.80 (*)    Hemoglobin 6.0 (*)    HCT 17.3 (*)    RDW 18.4 (*)    Platelets 95 (*)    All other components within normal limits  BASIC METABOLIC PANEL - Abnormal; Notable for the following components:   CO2 33 (*)    BUN 45 (*)    Creatinine, Ser 3.34 (*)    Calcium 8.3 (*)    GFR calc non Af Amer 16 (*)    GFR calc Af Amer 19 (*)    All other components within normal limits  PHOSPHORUS - Abnormal; Notable for the following components:   Phosphorus 1.6 (*)    All other components within normal limits  GLUCOSE, CAPILLARY - Abnormal; Notable for the following components:   Glucose-Capillary 104 (*)    All other components within normal limits  LIPASE, BLOOD  VITAMIN B12  FOLATE  GLUCOSE, CAPILLARY  GLUCOSE, CAPILLARY  GLUCOSE, CAPILLARY  URINALYSIS, COMPLETE (UACMP) WITH MICROSCOPIC  HEMOGLOBIN  CBG MONITORING, ED  TYPE AND SCREEN  PREPARE RBC (CROSSMATCH)  ABO/RH  PREPARE  RBC (CROSSMATCH)    Lab work reviewed by me with elevated troponin although in the setting of renal  disease not significant.  His hemoglobin is 7.3 down from previous in the mid eights to nines. __________________________________________  EKG  ED ECG REPORT I, Darel Hong, the attending physician, personally viewed and interpreted this ECG.  Date: 09/29/2017 EKG Time:  Rate: 85 Rhythm: normal sinus rhythm QRS Axis: normal Intervals: Long QTc ST/T Wave abnormalities: normal Narrative Interpretation: no evidence of acute ischemia  ____________________________________________  RADIOLOGY  CT abdomen pelvis reviewed by me with no acute disease Chest x-ray reviewed by me with no acute disease ____________________________________________   PROCEDURES  Procedure(s) performed: no  .Critical Care Performed by: Darel Hong, MD Authorized by: Darel Hong, MD   Critical care provider statement:    Critical care time (minutes):  30   Critical care time was exclusive of:  Separately billable procedures and treating other patients   Critical care was necessary to treat or prevent imminent or life-threatening deterioration of the following conditions:  Circulatory failure   Critical care was time spent personally by me on the following activities:  Development of treatment plan with patient or surrogate, discussions with consultants, evaluation of patient's response to treatment, examination of patient, obtaining history from patient or surrogate, ordering and performing treatments and interventions, ordering and review of laboratory studies, ordering and review of radiographic studies, pulse oximetry, re-evaluation of patient's condition and review of old charts    Critical Care performed: Yes  ____________________________________________   INITIAL IMPRESSION / ASSESSMENT AND PLAN / ED COURSE  Pertinent labs & imaging results that were available during my care of the  patient were reviewed by me and considered in my medical decision making (see chart for details).   As part of my medical decision making, I reviewed the following data within the [place] History obtained from family if available, nursing notes, old chart and ekg, as well as notes from prior ED visits.  While the patient arrives relatively well-appearing he had a concerning syncopal event.  He has a complicated past medical history including end-stage renal disease, is dialysis dependent and is due for dialysis today as well as coronary artery disease and a known AAA.  He very well could merely be fluid overloaded or intravascularly depleted, however the worse case scenario would be his AAA could have ruptured versus a dissection.  CT scan is pending.     Fortunately the patient's CT scan is reassuring with no evidence of rupture.  His recurrent syncopal events in the setting of lower hemoglobin are concerning and I do believe he requires inpatient admission for blood transfusion and continued telemetry monitoring.  The patient, family, and hospitalist agree with the plan. ____________________________________________   FINAL CLINICAL IMPRESSION(S) / ED DIAGNOSES  Final diagnoses:  Syncope, unspecified syncope type  ESRD (end stage renal disease) ([place])  Anemia, unspecified type      NEW MEDICATIONS STARTED DURING THIS VISIT:  Current Discharge Medication List       Note:  This document was prepared using Dragon voice recognition software and may include unintentional dictation errors.     Darel Hong, MD 09/30/17 1718

## 2017-09-29 NOTE — Progress Notes (Signed)
This note also relates to the following rows which could not be included: Pulse Rate - Cannot attach notes to unvalidated device data Resp - Cannot attach notes to unvalidated device data SpO2 - Cannot attach notes to unvalidated device data  Hd started  

## 2017-09-29 NOTE — Consult Note (Signed)
[facility] CONSULT NOTE    Date: 09/29/2017                  Patient Name:  Brent Glover  MRN: [id number]  DOB: 1935-08-24  Age / Sex: 82 y.o., male         PCP: Christen Bame, DO                 Service Requesting Consult: Hospitalist                 Reason for Consult: ESRD on HD            History of Present Illness: Patient is a 82 y.o. 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  Patient also reports of generalized weakness and falls x2 prior to admission.  These happened yesterday.  Patient was to have had dialysis today as an outpatient but was too weak and came here instead.  Patient was found to have worsening anemia of chronic kidney disease.  He is now seen and evaluated during hemodialysis and tolerating well.  Blood transfusion has been planned.   Medications: Outpatient medications: Medications Prior to Admission  Medication Sig Dispense Refill Last Dose  . acetaminophen (TYLENOL) 500 MG tablet Take 500 mg by mouth every 6 (six) hours as needed for mild pain.   PRN at PRN  . aspirin 81 MG tablet Take 81 mg by mouth daily.   09/28/2017 at 2200  . atorvastatin (LIPITOR) 80 MG tablet Take 80 mg by mouth daily.   09/28/2017 at 2200  . carvedilol (COREG) 6.25 MG tablet Take 6.25 mg by mouth 2 (two) times daily with a meal.   09/28/2017 at 2200  . ergocalciferol (VITAMIN D2) 50000 units capsule Take 50,000 Units by mouth every Friday.   09/21/2017 at 1000  . escitalopram (LEXAPRO) 10 MG tablet Take 20 mg by mouth daily.    09/28/2017 at 2200  . ferrous sulfate 325 (65 FE) MG tablet Take  325 mg by mouth daily with breakfast.   09/28/2017 at 1000  . fluticasone (FLONASE) 50 MCG/ACT nasal spray Place 2 sprays into both nostrils daily as needed for allergies.   PRN at PRN  . lisinopril (PRINIVIL,ZESTRIL) 10 MG tablet Take 10 mg by mouth at bedtime.    09/28/2017 at 2200  . magnesium oxide (MAG-OX) 400 MG tablet Take 400 mg by mouth daily.   09/28/2017 at 2200  . Melatonin 5 MG TABS Take 5 mg by mouth at bedtime.   09/28/2017 at 2200  . midodrine (PROAMATINE) 5 MG tablet Take 5-10 mg by mouth Every Tuesday,Thursday,and Saturday with dialysis. Give 10 mg by mouth 30 minutes before dialysis and 5 mg by mouth half way through dialysis.   09/27/2017 at Unknown time  . pantoprazole (PROTONIX) 40 MG tablet Take 40 mg by mouth daily.   09/28/2017 at 1000  . rOPINIRole (REQUIP) 0.25 MG tablet Take 0.5 mg by mouth at bedtime. Reported on 02/10/2015   09/28/2017 at 2200  . spironolactone (ALDACTONE) 25 MG tablet Take 12.5 mg by mouth at bedtime.   09/28/2017 at 2200  . tamsulosin (FLOMAX) 0.4 MG CAPS Take 0.4 mg  by mouth daily.   09/28/2017 at 2200  . amLODipine (NORVASC) 10 MG tablet Take 0.5 tablets (5 mg total) by mouth daily. (Patient not taking: Reported on 09/29/2017)   Completed Course at Unknown time  . carvedilol (COREG) 25 MG tablet Take 0.5 tablets (12.5 mg total) by mouth 2 (two) times daily with a meal. (Patient not taking: Reported on 09/29/2017)   Not Taking at Unknown time  . furosemide (LASIX) 20 MG tablet Take 1 tablet (20 mg total) by mouth daily. (Patient not taking: Reported on 09/29/2017) 30 tablet 3 Completed Course at Unknown time    Current medications: Current Facility-Administered Medications  Medication Dose Route Frequency Provider Last Rate Last Dose  . 0.9 %  sodium chloride infusion (Manually program via Guardrails IV Fluids)   Intravenous Once Dustin Flock, MD      . 0.9 %  sodium chloride infusion  250 mL Intravenous PRN Dustin Flock, MD      . acetaminophen (TYLENOL)  tablet 650 mg  650 mg Oral Q6H PRN Dustin Flock, MD       Or  . acetaminophen (TYLENOL) suppository 650 mg  650 mg Rectal Q6H PRN Dustin Flock, MD      . amLODipine (NORVASC) tablet 5 mg  5 mg Oral Daily Dustin Flock, MD      . aspirin EC tablet 81 mg  81 mg Oral Daily Dustin Flock, MD      . atorvastatin (LIPITOR) tablet 80 mg  80 mg Oral Daily Dustin Flock, MD      . carvedilol (COREG) tablet 6.25 mg  6.25 mg Oral BID WC Dustin Flock, MD      . escitalopram (LEXAPRO) tablet 20 mg  20 mg Oral Daily Dustin Flock, MD      . Derrill Memo ON 09/30/2017] ferrous sulfate tablet 325 mg  325 mg Oral Q breakfast Dustin Flock, MD      . fluticasone (FLONASE) 50 MCG/ACT nasal spray 2 spray  2 spray Each Nare Daily PRN Dustin Flock, MD      . insulin aspart (novoLOG) injection 0-9 Units  0-9 Units Subcutaneous TID WC Dustin Flock, MD      . magnesium oxide (MAG-OX) tablet 400 mg  400 mg Oral Daily Dustin Flock, MD      . Melatonin TABS 5 mg  5 mg Oral QHS Dustin Flock, MD      . Derrill Memo ON 10/02/2017] midodrine (PROAMATINE) tablet 10 mg  10 mg Oral Q T,Th,Sa-HD Dustin Flock, MD      . Derrill Memo ON 10/02/2017] midodrine (PROAMATINE) tablet 5 mg  5 mg Oral Q T,Th,Sa-HD Dustin Flock, MD      . ondansetron [facility]) tablet 4 mg  4 mg Oral Q6H PRN Dustin Flock, MD       Or  . ondansetron (ZOFRAN) injection 4 mg  4 mg Intravenous Q6H PRN Dustin Flock, MD      . pantoprazole (PROTONIX) EC tablet 40 mg  40 mg Oral Daily Dustin Flock, MD      . rOPINIRole (REQUIP) tablet 0.5 mg  0.5 mg Oral QHS Dustin Flock, MD      . sodium chloride flush (NS) 0.9 % injection 3 mL  3 mL Intravenous Q12H Dustin Flock, MD      . sodium chloride flush (NS) 0.9 % injection 3 mL  3 mL Intravenous PRN Dustin Flock, MD      . spironolactone (ALDACTONE) tablet 12.5 mg  12.5 mg Oral QHS Dustin Flock, MD      .  tamsulosin (FLOMAX) capsule 0.4 mg  0.4 mg Oral Daily Dustin Flock, MD       . Derrill Memo ON 10/05/2017] Vitamin D (Ergocalciferol) (DRISDOL) capsule 50,000 Units  50,000 Units Oral Q Lawrence Santiago, MD          Allergies: Allergies  Allergen Reactions  . Montelukast Shortness Of Breath  . Trazodone Other (See Comments)    "it hypes me up instead of putting me to sleep"  . Hydrocodone Nausea And Vomiting      Past Medical History: Past Medical History:  Diagnosis Date  . BPH (benign prostatic hyperplasia)   . Chronic diastolic heart failure ([place])   . COPD (chronic obstructive pulmonary disease) ([place])   . Diabetes mellitus without complication ([place])   . ESRD (end stage renal disease) ([place])    stage III  . Hyperlipidemia   . Hypertension      Past Surgical History: Past Surgical History:  Procedure Laterality Date  . ABDOMINAL AORTIC ENDOVASCULAR STENT GRAFT    . CYSTOSCOPY    . KNEE ARTHROPLASTY       Family History: History reviewed. No pertinent family history.   Social History: Social History   Socioeconomic History  . Marital status: Married    Spouse name: Not on file  . Number of children: Not on file  . Years of education: Not on file  . Highest education level: Not on file  Occupational History  . Not on file  Social Needs  . Financial resource strain: Not on file  . Food insecurity:    Worry: Not on file    Inability: Not on file  . Transportation needs:    Medical: Not on file    Non-medical: Not on file  Tobacco Use  . Smoking status: Former Smoker    Packs/day: 1.00    Years: 60.00    Pack years: 60.00    Types: Cigarettes    Last attempt to quit: 10/11/2014    Years since quitting: 2.9  . Smokeless tobacco: Never Used  Substance and Sexual Activity  . Alcohol use: No  . Drug use: No  . Sexual activity: Not on file  Lifestyle  . Physical activity:    Days per week: Not on file    Minutes per session: Not on file  . Stress: Not on file  Relationships  . Social connections:    Talks on phone: Not on file     Gets together: Not on file    Attends religious service: Not on file    Active member of club or organization: Not on file    Attends meetings of clubs or organizations: Not on file    Relationship status: Not on file  . Intimate partner violence:    Fear of current or ex partner: Not on file    Emotionally abused: Not on file    Physically abused: Not on file    Forced sexual activity: Not on file  Other Topics Concern  . Not on file  Social History Narrative  . Not on file     Review of Systems: Review of Systems  Constitutional: Positive for malaise/fatigue. Negative for chills and fever.  HENT: Negative for congestion, hearing loss and tinnitus.   Eyes: Negative for blurred vision and double vision.  Respiratory: Negative for cough, hemoptysis and sputum production.   Cardiovascular: Negative for chest pain, palpitations and orthopnea.  Gastrointestinal: Negative for heartburn, melena, nausea and vomiting.  Genitourinary: Negative for dysuria and  urgency.  Musculoskeletal: Positive for falls.  Skin: Negative for itching and rash.  Neurological: Positive for loss of consciousness. Negative for dizziness.  Psychiatric/Behavioral: Negative for depression. The patient is not nervous/anxious.      Vital Signs: Blood pressure (!) 87/48, pulse 83, temperature 98.1 F (36.7 C), temperature source Oral, resp. rate 17, height 5\' 7"  (1.702 m), weight 74.2 kg, SpO2 95 %.  Weight trends: Filed Weights   09/29/17 1059 09/29/17 1507  Weight: 73 kg 74.2 kg    Physical Exam: General: NAD, resting in bed  Head: Normocephalic, atraumatic.  Eyes: Anicteric, EOMI  Nose: Mucous membranes moist, not inflammed, nonerythematous.  Throat: Oropharynx nonerythematous, no exudate appreciated.   Neck: Supple, trachea midline.  Lungs:  Normal respiratory effort. Clear to auscultation BL without crackles or wheezes.  Heart: RRR. S1 and S2 normal without gallop, murmur, or rubs.  Abdomen:   BS normoactive. Soft, Nondistended, non-tender.  No masses or organomegaly.  Extremities: No pretibial edema.  Neurologic: A&O X3, Motor strength is 5/5 in the all 4 extremities  Skin: No visible rashes, scars.    Lab results: Basic Metabolic Panel: Recent Labs  Lab 09/29/17 1120 09/29/17 1854  NA 138  --   K 5.1  --   CL 99  --   CO2 29  --   GLUCOSE 158*  --   BUN 98*  --   CREATININE 5.25*  --   CALCIUM 9.4  --   PHOS  --  1.6*    Liver Function Tests: Recent Labs  Lab 09/29/17 1120  AST 17  ALT 11  ALKPHOS 64  BILITOT 0.2*  PROT 6.1*  ALBUMIN 3.0*   Recent Labs  Lab 09/29/17 1120  LIPASE 39   No results for input(s): AMMONIA in the last 168 hours.  CBC: Recent Labs  Lab 09/29/17 1120  WBC 8.9  HGB 7.3*  HCT 21.5*  MCV 97.1  PLT 110*    Cardiac Enzymes: Recent Labs  Lab 09/29/17 1120  TROPONINI 0.05*    BNP: Invalid input(s): POCBNP  CBG: Recent Labs  Lab 09/29/17 1111 09/29/17 1616  GLUCAP 148* 116*    Microbiology: No results found for this or any previous visit.  Coagulation Studies: No results for input(s): LABPROT, INR in the last 72 hours.  Urinalysis: No results for input(s): COLORURINE, LABSPEC, PHURINE, GLUCOSEU, HGBUR, BILIRUBINUR, KETONESUR, PROTEINUR, UROBILINOGEN, NITRITE, LEUKOCYTESUR in the last 72 hours.  Invalid input(s): APPERANCEUR    Imaging: Ct Abdomen Pelvis Wo Contrast  Result Date: 09/29/2017 CLINICAL DATA:  Abdominal aortic aneurysm.  Preop. EXAM: CT ABDOMEN AND PELVIS WITHOUT CONTRAST TECHNIQUE: Multidetector CT imaging of the abdomen and pelvis was performed following the standard protocol without IV contrast. COMPARISON:  None. FINDINGS: Lower chest: Minimal dependent atelectasis. Mild wall thickening of the distal esophagus. Coronary artery calcifications. Pericardial calcifications are also noted along the lateral aspect of the left ventricle. There are nonspecific calcifications at the edge of the  right atrium. Hepatobiliary: Unremarkable liver. Multiple gallstones are noted. Gallbladder is decompressed. Pancreas: Unremarkable Spleen: Unremarkable Adrenals/Urinary Tract: 1.6 cm left adrenal nodule on image 24. It is nonspecific. Small renal cysts. There are calcifications within the right kidney that are nonspecific. Other hypodensities in the kidneys are too small to characterize. Unremarkable bladder. Stomach/Bowel: Stomach is decompressed. Normal appendix. No obvious mass in the colon. Diverticulosis of the descending and sigmoid colon. There is no evidence of small-bowel obstruction. Vascular/Lymphatic: Aorto bi-iliac stent graft, excluding an aortic aneurysm, is  in place. Contrast was not administered for this study and patency cannot be assessed. Endoleak cannot be assessed. Maximal AP and transverse diameters of the aortic aneurysm sac are 5.3 and 5.3 cm. Reproductive: Unremarkable prostate. Other: No free fluid.  No evidence of retroperitoneal hemorrhage. Musculoskeletal: There is no vertebral compression deformity. Facet arthropathy in the lumbar spine is noted. IMPRESSION: Noncontrast study demonstrates an aorto bi-iliac stent graft which excludes an aortic aneurysm. Maximal aneurysm sac diameter is 5.3 cm. Nonspecific left adrenal nodule. Cholelithiasis. Electronically Signed   By: Marybelle Killings M.D.   On: 09/29/2017 12:33   Dg Elbow Complete Left  Result Date: 09/29/2017 CLINICAL DATA:  Left elbow injury after fall today. EXAM: LEFT ELBOW - COMPLETE 3+ VIEW COMPARISON:  None. FINDINGS: There is no evidence of fracture, dislocation, or joint effusion. There is no evidence of arthropathy. Moderate spurring of olecranon is noted. Soft tissues are unremarkable. IMPRESSION: No acute abnormality seen in the left elbow. Electronically Signed   By: Marijo Conception, M.D.   On: 09/29/2017 11:45   Dg Chest Port 1 View  Result Date: 09/29/2017 CLINICAL DATA:  Fall today. EXAM: PORTABLE CHEST 1 VIEW  COMPARISON:  Radiographs of Jun 17, 2012. FINDINGS: Stable cardiomediastinal silhouette. Atherosclerosis of thoracic aorta is noted. Interval placement of right internal jugular dialysis catheter with distal tip in expected position of cavoatrial junction. No pneumothorax or pleural effusion is noted. Both lungs are clear. The visualized skeletal structures are unremarkable. IMPRESSION: Interval placement of right internal jugular dialysis catheter with distal tip in expected position of cavoatrial junction. No acute cardiopulmonary abnormality seen. Electronically Signed   By: Marijo Conception, M.D.   On: 09/29/2017 11:47   Dg Hand Complete Left  Result Date: 09/29/2017 CLINICAL DATA:  Left hand injury after fall today. EXAM: LEFT HAND - COMPLETE 3+ VIEW COMPARISON:  None. FINDINGS: There is no evidence of fracture or dislocation. There is no evidence of arthropathy or other focal bone abnormality. Soft tissues are unremarkable. IMPRESSION: No significant abnormality seen in the left hand. Electronically Signed   By: Marijo Conception, M.D.   On: 09/29/2017 11:44      Assessment & Plan: Pt is a 82 y.o. 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   [facility]  1.  ESRD on HD TTS.  Patient seen and evaluated during hemodialysis today.  Appears to be tolerating well.  Blood has not arrived yet therefore he will receive this while on the floor.  2.  Anemia of chronic kidney disease.  Hemoglobin was low at 7.3.  He is receiving 1 unit PRBC transfusion today.  This will be administered on the floor as he only has 30 minutes left on  dialysis.  3.  Secondary hyperparathyroidism.  Phosphorus checked during dialysis and was found to be 1.6.  Recommend rechecking this prior to the next treatment.  4.  Hypotension: Patient has history of intradialytic hypotension.  He receives midodrine 15 mg prior to treatment.  5.  Thanks for consultation.

## 2017-09-29 NOTE — ED Notes (Signed)
Patient transported to room 246

## 2017-09-29 NOTE — Progress Notes (Signed)
HD tx end    09/29/17 2044  Vital Signs  Pulse Rate 77  Pulse Rate Source Monitor  Resp (!) 25  BP 94/63  BP Location Right Arm  BP Method Automatic  Patient Position (if appropriate) Lying  Oxygen Therapy  SpO2 94 %  O2 Device Room Air  During Hemodialysis Assessment  Dialysis Fluid Bolus Normal Saline  Bolus Amount (mL) 250 mL  Intra-Hemodialysis Comments Tx completed

## 2017-09-29 NOTE — ED Notes (Signed)
AAOx3.  Skin warm and dry.  NAD 

## 2017-09-29 NOTE — Progress Notes (Signed)
Patient transferred from ED via stretcher, he is alert, oriented, and required assistance to move to the bed. He denies pain at this time. Bilateral elbows redressed with non-adherent dressings and gauze.  Vials lying down were within normal limits, when we attempted orthostatics the patient was unable to sit on the side of the bed for long enough to complete. He complained of dizziness when on the side of the bed and was assisted back into bed. Patient oriented to unit and call bell. Will continue to monitor.

## 2017-09-29 NOTE — Progress Notes (Signed)
Post HD assessment    09/29/17 2052  Neurological  Level of Consciousness Alert  Orientation Level Oriented X4  Respiratory  Respiratory Pattern Regular;Unlabored  Chest Assessment Chest expansion symmetrical  Cardiac  Pulse Irregular  ECG Monitor Yes  Cardiac Rhythm ST;Atrial fibrillation  Ectopy Ventricular tachycardia, non-sustained;Multifocal PVC's  Vascular  R Radial Pulse +2  L Radial Pulse +2  Integumentary  Integumentary (WDL) X  Skin Color Pale  Musculoskeletal  Musculoskeletal (WDL) X  Generalized Weakness Yes  Assistive Device None  GU Assessment  Genitourinary (WDL) X  Genitourinary Symptoms  (HD)  Psychosocial  Psychosocial (WDL) WDL

## 2017-09-29 NOTE — Progress Notes (Addendum)
Pt BP was at 104/51 HR 95 and complaint of dizziness and has scheduled aldactone 12.5 mg. Notify prime. Will continue to monitor.  Update 2324: Talked to doctor Willis and ordered to hold the aldactone at this time. Will continue to monitor.  Update 0300: Pt has a black tarry stool. Pt states that he takes iron pills, but pt also received a unit of blood during dialysis for a Hgb 7.3 and pt was desating at 87 on RA ( pt was place on 1 liter oxygen at this time). Notify Prime. Will continue to monitor.  Update 0308: Talked to doctor Marcille Blanco and he ordered to place pt on bedrest at this time. Will continue to monitor.  Update 0350: Place a wound consult for a skin tear on bilateral elbow and left ring finger. Will continue to monitor.

## 2017-09-29 NOTE — Progress Notes (Signed)
Post HD assessment. Pt tolerated tx well without c/o or complication. Pt had a non-symptomatic drop in BP, UF turned off,MD aware. Net UF -67, goal not met.    09/29/17 2054  Vital Signs  Temp 98.1 F (36.7 C)  Temp Source Oral  Pulse Rate 95  Pulse Rate Source Monitor  Resp (!) 22  BP (!) 91/42  BP Location Right Arm  BP Method Automatic  Patient Position (if appropriate) Lying  Oxygen Therapy  SpO2 94 %  O2 Device Room Air  Dialysis Weight  Weight 76.9 kg  Type of Weight Post-Dialysis  Post-Hemodialysis Assessment  Rinseback Volume (mL) 250 mL  KECN 57 V  Dialyzer Clearance Lightly streaked  Duration of HD Treatment -hour(s) 3 hour(s)  Hemodialysis Intake (mL) 500 mL  UF Total -Machine (mL) 433 mL  Net UF (mL) -67 mL  Tolerated HD Treatment Yes

## 2017-09-29 NOTE — ED Notes (Signed)
Attempted to call report

## 2017-09-29 NOTE — Care Management Obs Status (Signed)
[place] NOTIFICATION   Patient Details  Name: Brent Glover MRN: [id number] Date of Birth: 1935-06-04   Medicare Observation Status Notification Given:  Yes    Tyaisha Cullom A Shatara Stanek, RN 09/29/2017, 3:56 PM

## 2017-09-29 NOTE — Progress Notes (Signed)
Advanced care plan.  Purpose of the Encounter: CODE STATUS  Parties in Attendance: Patient himself wife  Patient's Decision Capacity: Intact  Subjective/Patient's story: Patient 82 year old with end-stage renal disease presenting with episode of falling x2   Objective/Medical story I discussed with the patient regarding his desires for cardiac and pulmonary resuscitation explained him what this entails. He states that he would like everything done.  Goals of care determination:  Full code   CODE STATUS: Full code   Time spent discussing advanced care planning: 16 minutes

## 2017-09-30 ENCOUNTER — Observation Stay (HOSPITAL_BASED_OUTPATIENT_CLINIC_OR_DEPARTMENT_OTHER)
Admit: 2017-09-30 | Discharge: 2017-09-30 | Disposition: A | Discharge status: Home / Self Care | Payer: Medicare Other | Attending: Internal Medicine | Admitting: Internal Medicine

## 2017-09-30 DIAGNOSIS — E785 Hyperlipidemia, unspecified: Secondary | ICD-10-CM | POA: Diagnosis present

## 2017-09-30 DIAGNOSIS — I2510 Atherosclerotic heart disease of native coronary artery without angina pectoris: Secondary | ICD-10-CM | POA: Diagnosis present

## 2017-09-30 DIAGNOSIS — D649 Anemia, unspecified: Secondary | ICD-10-CM | POA: Diagnosis not present

## 2017-09-30 DIAGNOSIS — Z7982 Long term (current) use of aspirin: Secondary | ICD-10-CM | POA: Diagnosis not present

## 2017-09-30 DIAGNOSIS — K219 Gastro-esophageal reflux disease without esophagitis: Secondary | ICD-10-CM | POA: Diagnosis present

## 2017-09-30 DIAGNOSIS — N186 End stage renal disease: Secondary | ICD-10-CM | POA: Diagnosis present

## 2017-09-30 DIAGNOSIS — Z951 Presence of aortocoronary bypass graft: Secondary | ICD-10-CM | POA: Diagnosis not present

## 2017-09-30 DIAGNOSIS — Z87891 Personal history of nicotine dependence: Secondary | ICD-10-CM | POA: Diagnosis not present

## 2017-09-30 DIAGNOSIS — I953 Hypotension of hemodialysis: Secondary | ICD-10-CM | POA: Diagnosis present

## 2017-09-30 DIAGNOSIS — D62 Acute posthemorrhagic anemia: Secondary | ICD-10-CM | POA: Diagnosis present

## 2017-09-30 DIAGNOSIS — E1122 Type 2 diabetes mellitus with diabetic chronic kidney disease: Secondary | ICD-10-CM | POA: Diagnosis present

## 2017-09-30 DIAGNOSIS — Z79899 Other long term (current) drug therapy: Secondary | ICD-10-CM | POA: Diagnosis not present

## 2017-09-30 DIAGNOSIS — R531 Weakness: Secondary | ICD-10-CM | POA: Diagnosis present

## 2017-09-30 DIAGNOSIS — E875 Hyperkalemia: Secondary | ICD-10-CM | POA: Diagnosis not present

## 2017-09-30 DIAGNOSIS — I340 Nonrheumatic mitral (valve) insufficiency: Secondary | ICD-10-CM | POA: Diagnosis not present

## 2017-09-30 DIAGNOSIS — N400 Enlarged prostate without lower urinary tract symptoms: Secondary | ICD-10-CM | POA: Diagnosis present

## 2017-09-30 DIAGNOSIS — Z992 Dependence on renal dialysis: Secondary | ICD-10-CM | POA: Diagnosis not present

## 2017-09-30 DIAGNOSIS — Z885 Allergy status to narcotic agent status: Secondary | ICD-10-CM | POA: Diagnosis not present

## 2017-09-30 DIAGNOSIS — R296 Repeated falls: Secondary | ICD-10-CM | POA: Diagnosis present

## 2017-09-30 DIAGNOSIS — I132 Hypertensive heart and chronic kidney disease with heart failure and with stage 5 chronic kidney disease, or end stage renal disease: Secondary | ICD-10-CM | POA: Diagnosis present

## 2017-09-30 DIAGNOSIS — Z888 Allergy status to other drugs, medicaments and biological substances status: Secondary | ICD-10-CM | POA: Diagnosis not present

## 2017-09-30 DIAGNOSIS — J449 Chronic obstructive pulmonary disease, unspecified: Secondary | ICD-10-CM | POA: Diagnosis present

## 2017-09-30 DIAGNOSIS — I5032 Chronic diastolic (congestive) heart failure: Secondary | ICD-10-CM | POA: Diagnosis present

## 2017-09-30 DIAGNOSIS — N2581 Secondary hyperparathyroidism of renal origin: Secondary | ICD-10-CM | POA: Diagnosis present

## 2017-09-30 DIAGNOSIS — D631 Anemia in chronic kidney disease: Secondary | ICD-10-CM | POA: Diagnosis present

## 2017-09-30 LAB — CBC
HCT: 17.3 % — ABNORMAL LOW (ref 40.0–52.0)
Hemoglobin: 6 g/dL — ABNORMAL LOW (ref 13.0–18.0)
MCH: 33.4 pg (ref 26.0–34.0)
MCHC: 34.6 g/dL (ref 32.0–36.0)
MCV: 96.3 fL (ref 80.0–100.0)
PLATELETS: 95 10*3/uL — AB (ref 150–440)
RBC: 1.8 MIL/uL — ABNORMAL LOW (ref 4.40–5.90)
RDW: 18.4 % — AB (ref 11.5–14.5)
WBC: 7.2 10*3/uL (ref 3.8–10.6)

## 2017-09-30 LAB — PREPARE RBC (CROSSMATCH)

## 2017-09-30 LAB — BASIC METABOLIC PANEL
ANION GAP: 8 (ref 5–15)
BUN: 45 mg/dL — ABNORMAL HIGH (ref 8–23)
CALCIUM: 8.3 mg/dL — AB (ref 8.9–10.3)
CO2: 33 mmol/L — ABNORMAL HIGH (ref 22–32)
Chloride: 98 mmol/L (ref 98–111)
Creatinine, Ser: 3.34 mg/dL — ABNORMAL HIGH (ref 0.61–1.24)
GFR, EST AFRICAN AMERICAN: 19 mL/min — AB (ref >60)
GFR, EST NON AFRICAN AMERICAN: 16 mL/min — AB (ref >60)
GLUCOSE: 96 mg/dL (ref 70–99)
Potassium: 4.2 mmol/L (ref 3.5–5.1)
Sodium: 139 mmol/L (ref 135–145)

## 2017-09-30 LAB — ECHOCARDIOGRAM COMPLETE
Height: 67 in
Weight: 2712.54 oz

## 2017-09-30 LAB — GLUCOSE, CAPILLARY
GLUCOSE-CAPILLARY: 78 mg/dL (ref 70–99)
Glucose-Capillary: 89 mg/dL (ref 70–99)
Glucose-Capillary: 86 mg/dL (ref 70–99)
Glucose-Capillary: 91 mg/dL (ref 70–99)

## 2017-09-30 LAB — VITAMIN B12: VITAMIN B 12: 531 pg/mL (ref 180–914)

## 2017-09-30 MED ORDER — SODIUM CHLORIDE 0.9 % IV SOLN
100.0000 mg | INTRAVENOUS | Status: DC
  Filled 2017-09-30: qty 5

## 2017-09-30 MED ORDER — SODIUM PHOSPHATES 45 MMOLE/15ML IV SOLN
13.0000 mmol | Freq: Once | INTRAVENOUS | Status: AC
  Administered 2017-09-30: 13 mmol via INTRAVENOUS
  Filled 2017-09-30: qty 4.33

## 2017-09-30 MED ORDER — SODIUM CHLORIDE 0.9% IV SOLUTION: Freq: Once | INTRAVENOUS | Status: DC

## 2017-09-30 MED ORDER — SODIUM CHLORIDE 0.9 % IV SOLN
100.0000 mg | Freq: Once | INTRAVENOUS | Status: AC
  Administered 2017-09-30: 100 mg via INTRAVENOUS
  Filled 2017-09-30: qty 5

## 2017-09-30 NOTE — Plan of Care (Signed)
  Problem: Health Behavior/Discharge Planning: Goal: Ability to manage health-related needs will improve Outcome: Progressing Note:  Patient receiving 1 unit of PRBC's at this time. Patient tolerating well. Hemoglobin to be rechecked in the AM. Will continue to monitor. Wenda Low [facility]

## 2017-09-30 NOTE — Progress Notes (Signed)
Per Dr. Posey Pronto [facility]), patient is only to receive one (1) unit today, with a hemoglobin recheck in the AM. Based on that value, a decision will be made whether or not to administer the second ordered unit tomorrow. Will continue to monitor situation. Wenda Low [facility]

## 2017-09-30 NOTE — Progress Notes (Signed)
[facility]  ROUNDING NOTE   Subjective:  Patient did undergo hemodialysis overnight. Hemoglobin remains low this a.m. at 6.0. Patient having black tarry stools. Case discussed with Dr. Posey Pronto.   Objective:  Vital signs in last 24 hours:  Temp:  [97.9 F (36.6 C)-99.2 F (37.3 C)] 98.4 F (36.9 C) (08/25 0802) Pulse Rate:  [72-96] 78 (08/25 0804) Resp:  [10-27] 18 (08/25 0332) BP: (80-122)/(37-99) 100/59 (08/25 0804) SpO2:  [87 %-100 %] 95 % (08/25 0804) Weight:  [73 kg-76.9 kg] 76.9 kg (08/24 2054)  Weight change:  Filed Weights   09/29/17 1059 09/29/17 1507 09/29/17 2054  Weight: 73 kg 74.2 kg 76.9 kg    Intake/Output: I/O last 3 completed shifts: In: 120 [P.O.:120] Out: 233 [Urine:300]   Intake/Output this shift:  No intake/output data recorded.  Physical Exam: General: No acute distress  Head: Normocephalic, atraumatic. Moist oral mucosal membranes  Eyes: Anicteric  Neck: Supple, trachea midline  Lungs:  Clear to auscultation, normal effort  Heart: S1S2 no rubs  Abdomen:  Soft, nontender, bowel sounds present  Extremities: Trace peripheral edema.  Neurologic: Awake, alert, following commands  Skin: No lesions  Access: IJ permcath in place    Basic Metabolic Panel: Recent Labs  Lab 09/29/17 1120 09/29/17 1854 09/30/17 0434  NA 138  --  139  K 5.1  --  4.2  CL 99  --  98  CO2 29  --  33*  GLUCOSE 158*  --  96  BUN 98*  --  45*  CREATININE 5.25*  --  3.34*  CALCIUM 9.4  --  8.3*  PHOS  --  1.6*  --     Liver Function Tests: Recent Labs  Lab 09/29/17 1120  AST 17  ALT 11  ALKPHOS 64  BILITOT 0.2*  PROT 6.1*  ALBUMIN 3.0*   Recent Labs  Lab 09/29/17 1120  LIPASE 39   No results for input(s): AMMONIA in the last 168 hours.  CBC: Recent Labs  Lab 09/29/17 1120 09/30/17 0434  WBC 8.9 7.2  HGB 7.3* 6.0*  HCT 21.5* 17.3*  MCV 97.1 96.3  PLT 110* 95*    Cardiac Enzymes: Recent Labs  Lab 09/29/17 1120  TROPONINI  0.05*    BNP: Invalid input(s): POCBNP  CBG: Recent Labs  Lab 09/29/17 1111 09/29/17 1616 09/29/17 2337 09/30/17 0831  GLUCAP 148* 116* 104* 29    Microbiology: No results found for this or any previous visit.  Coagulation Studies: No results for input(s): LABPROT, INR in the last 72 hours.  Urinalysis: No results for input(s): COLORURINE, LABSPEC, PHURINE, GLUCOSEU, HGBUR, BILIRUBINUR, KETONESUR, PROTEINUR, UROBILINOGEN, NITRITE, LEUKOCYTESUR in the last 72 hours.  Invalid input(s): APPERANCEUR    Imaging: Ct Abdomen Pelvis Wo Contrast  Result Date: 09/29/2017 CLINICAL DATA:  Abdominal aortic aneurysm.  Preop. EXAM: CT ABDOMEN AND PELVIS WITHOUT CONTRAST TECHNIQUE: Multidetector CT imaging of the abdomen and pelvis was performed following the standard protocol without IV contrast. COMPARISON:  None. FINDINGS: Lower chest: Minimal dependent atelectasis. Mild wall thickening of the distal esophagus. Coronary artery calcifications. Pericardial calcifications are also noted along the lateral aspect of the left ventricle. There are nonspecific calcifications at the edge of the right atrium. Hepatobiliary: Unremarkable liver. Multiple gallstones are noted. Gallbladder is decompressed. Pancreas: Unremarkable Spleen: Unremarkable Adrenals/Urinary Tract: 1.6 cm left adrenal nodule on image 24. It is nonspecific. Small renal cysts. There are calcifications within the right kidney that are nonspecific. Other hypodensities in the kidneys are  too small to characterize. Unremarkable bladder. Stomach/Bowel: Stomach is decompressed. Normal appendix. No obvious mass in the colon. Diverticulosis of the descending and sigmoid colon. There is no evidence of small-bowel obstruction. Vascular/Lymphatic: Aorto bi-iliac stent graft, excluding an aortic aneurysm, is in place. Contrast was not administered for this study and patency cannot be assessed. Endoleak cannot be assessed. Maximal AP and transverse  diameters of the aortic aneurysm sac are 5.3 and 5.3 cm. Reproductive: Unremarkable prostate. Other: No free fluid.  No evidence of retroperitoneal hemorrhage. Musculoskeletal: There is no vertebral compression deformity. Facet arthropathy in the lumbar spine is noted. IMPRESSION: Noncontrast study demonstrates an aorto bi-iliac stent graft which excludes an aortic aneurysm. Maximal aneurysm sac diameter is 5.3 cm. Nonspecific left adrenal nodule. Cholelithiasis. Electronically Signed   By: Marybelle Killings M.D.   On: 09/29/2017 12:33   Dg Elbow Complete Left  Result Date: 09/29/2017 CLINICAL DATA:  Left elbow injury after fall today. EXAM: LEFT ELBOW - COMPLETE 3+ VIEW COMPARISON:  None. FINDINGS: There is no evidence of fracture, dislocation, or joint effusion. There is no evidence of arthropathy. Moderate spurring of olecranon is noted. Soft tissues are unremarkable. IMPRESSION: No acute abnormality seen in the left elbow. Electronically Signed   By: Marijo Conception, M.D.   On: 09/29/2017 11:45   Dg Chest Port 1 View  Result Date: 09/29/2017 CLINICAL DATA:  Fall today. EXAM: PORTABLE CHEST 1 VIEW COMPARISON:  Radiographs of Jun 17, 2012. FINDINGS: Stable cardiomediastinal silhouette. Atherosclerosis of thoracic aorta is noted. Interval placement of right internal jugular dialysis catheter with distal tip in expected position of cavoatrial junction. No pneumothorax or pleural effusion is noted. Both lungs are clear. The visualized skeletal structures are unremarkable. IMPRESSION: Interval placement of right internal jugular dialysis catheter with distal tip in expected position of cavoatrial junction. No acute cardiopulmonary abnormality seen. Electronically Signed   By: Marijo Conception, M.D.   On: 09/29/2017 11:47   Dg Hand Complete Left  Result Date: 09/29/2017 CLINICAL DATA:  Left hand injury after fall today. EXAM: LEFT HAND - COMPLETE 3+ VIEW COMPARISON:  None. FINDINGS: There is no evidence of  fracture or dislocation. There is no evidence of arthropathy or other focal bone abnormality. Soft tissues are unremarkable. IMPRESSION: No significant abnormality seen in the left hand. Electronically Signed   By: Marijo Conception, M.D.   On: 09/29/2017 11:44     Medications:   . sodium chloride     . sodium chloride   Intravenous Once  . amLODipine  5 mg Oral Daily  . aspirin EC  81 mg Oral Daily  . atorvastatin  80 mg Oral Daily  . carvedilol  6.25 mg Oral BID WC  . escitalopram  20 mg Oral Daily  . ferrous sulfate  325 mg Oral Q breakfast  . insulin aspart  0-9 Units Subcutaneous TID WC  . magnesium oxide  400 mg Oral Daily  . Melatonin  5 mg Oral QHS  . [START ON 10/02/2017] midodrine  10 mg Oral Q T,Th,Sa-HD  . [START ON 10/02/2017] midodrine  5 mg Oral Q T,Th,Sa-HD  . pantoprazole  40 mg Oral Daily  . rOPINIRole  0.5 mg Oral QHS  . sodium chloride flush  3 mL Intravenous Q12H  . spironolactone  12.5 mg Oral QHS  . tamsulosin  0.4 mg Oral Daily  . [START ON 10/05/2017] Vitamin D (Ergocalciferol)  50,000 Units Oral Q Fri   sodium chloride, acetaminophen **OR** acetaminophen, fluticasone,  ondansetron **OR** ondansetron (ZOFRAN) IV, sodium chloride flush  Assessment/ Plan:  82 y.o. 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   [facility]  1.  ESRD on HD TTS.  Patient did complete hemodialysis yesterday and tolerated well.  No urgent indication for dialysis today.  Reassess the patient for dialysis tomorrow as it appears that he will receive blood today.  2.  Anemia of chronic kidney disease/anemia blood  loss.    hemoglobin down to 6.0.  Case discussed with Dr. Posey Pronto.  Recommend GI consultation given drop in hemoglobin.  3.  Secondary hyperparathyroidism.    Serum phosphorus was 1.6 yesterday.  Administer sodium phosphorus 13 mmol IV x1 today.  4.  Hypotension: Maintain the patient on midodrine.   LOS: 0 Brent Glover 8/25/20198:52 AM

## 2017-09-30 NOTE — Progress Notes (Signed)
PT Cancellation Note  Patient Details Name: Brent Glover MRN: [id number] DOB: 02/14/1935   Cancelled Treatment:    Reason Eval/Treat Not Completed: Other (comment). Consult received and chart reviewed. Pt contraindicated to work with therapy at this time due to low Hgb downtrending from 7.3->6.0. Of note, pt symptomatic per chart review. Orders for blood transfusion. Will hold and re-attempt next date if medically stable.   Caidence Kaseman 09/30/2017, 8:28 AM  Greggory Stallion, PT, DPT [phone]

## 2017-09-30 NOTE — Progress Notes (Signed)
Pharmacy Consult for Venofer Indication: Iron-deficiency anemia in CKD  Allergies  Allergen Reactions  . Montelukast Shortness Of Breath  . Trazodone Other (See Comments)    "it hypes me up instead of putting me to sleep"  . Hydrocodone Nausea And Vomiting    Patient Measurements: Height: 5\' 7"  (170.2 cm) Weight: 169 lb 8.5 oz (76.9 kg) IBW/kg (Calculated) : 66.1  Vital Signs: Temp: 98.4 F (36.9 C) (08/25 0802) Temp Source: Oral (08/25 0802) BP: 100/59 (08/25 0804) Pulse Rate: 78 (08/25 0804)  Labs: Recent Labs    09/29/17 1120 09/30/17 0434  HGB 7.3* 6.0*  HCT 21.5* 17.3*  PLT 110* 95*  CREATININE 5.25* 3.34*  TROPONINI 0.05*  --     Estimated Creatinine Clearance: 16.8 mL/min (A) (by C-G formula based on SCr of 3.34 mg/dL (H)).  Assessment: 82 y/o M with iron-deficiency anemia in CKD. Pharmacy consulted for dosing of IV iron.   Plan:  Venofer 100 mg iv qHD up to 10 doses. Will monitor HGB, iron status.   Ulice Dash D 09/30/2017,8:58 AM

## 2017-09-30 NOTE — Progress Notes (Signed)
[name] NAME: Brent Glover    MR#:  [id number]  DATE OF BIRTH:  1935-08-03  SUBJECTIVE:   Came in with increasing weakness and found to have low hgb Pt denies syncopal episode or LOC REVIEW OF SYSTEMS:   Review of Systems  Constitutional: Negative for chills, fever and weight loss.  HENT: Negative for ear discharge, ear pain and nosebleeds.   Eyes: Negative for blurred vision, pain and discharge.  Respiratory: Negative for sputum production, shortness of breath, wheezing and stridor.   Cardiovascular: Negative for chest pain, palpitations, orthopnea and PND.  Gastrointestinal: Negative for abdominal pain, diarrhea, nausea and vomiting.  Genitourinary: Negative for frequency and urgency.  Musculoskeletal: Negative for back pain and joint pain.  Neurological: Positive for weakness. Negative for sensory change, speech change and focal weakness.  Psychiatric/Behavioral: Negative for depression and hallucinations. The patient is not nervous/anxious.    Tolerating Diet:yes Tolerating PT: pending  DRUG ALLERGIES:   Allergies  Allergen Reactions  . Montelukast Shortness Of Breath  . Trazodone Other (See Comments)    "it hypes me up instead of putting me to sleep"  . Hydrocodone Nausea And Vomiting    VITALS:  Blood pressure (!) 100/59, pulse 78, temperature 98.4 F (36.9 C), temperature source Oral, resp. rate 18, height 5\' 7"  (1.702 m), weight 76.9 kg, SpO2 95 %.  PHYSICAL EXAMINATION:   Physical Exam  GENERAL:  82 y.o.-year-old patient lying in the bed with no acute distress. Pallor+ EYES: Pupils equal, round, reactive to light and accommodation. No scleral icterus. Extraocular muscles intact.  HEENT: Head atraumatic, normocephalic. Oropharynx and nasopharynx clear.  NECK:  Supple, no jugular venous distention. No thyroid enlargement, no tenderness.  LUNGS: Normal breath sounds bilaterally, no wheezing,  rales, rhonchi. No use of accessory muscles of respiration.  CARDIOVASCULAR: S1, S2 normal. No murmurs, rubs, or gallops.  ABDOMEN: Soft, nontender, nondistended. Bowel sounds present. No organomegaly or mass.  EXTREMITIES: No cyanosis, clubbing or edema b/l.    NEUROLOGIC: Cranial nerves II through XII are intact. No focal Motor or sensory deficits b/l.   PSYCHIATRIC:  patient is alert and oriented x 3.  SKIN: No obvious rash, lesion, or ulcer.   LABORATORY PANEL:  CBC Recent Labs  Lab 09/30/17 0434  WBC 7.2  HGB 6.0*  HCT 17.3*  PLT 95*    Chemistries  Recent Labs  Lab 09/29/17 1120 09/30/17 0434  NA 138 139  K 5.1 4.2  CL 99 98  CO2 29 33*  GLUCOSE 158* 96  BUN 98* 45*  CREATININE 5.25* 3.34*  CALCIUM 9.4 8.3*  AST 17  --   ALT 11  --   ALKPHOS 64  --   BILITOT 0.2*  --    Cardiac Enzymes Recent Labs  Lab 09/29/17 1120  TROPONINI 0.05*   RADIOLOGY:  Ct Abdomen Pelvis Wo Contrast  Result Date: 09/29/2017 CLINICAL DATA:  Abdominal aortic aneurysm.  Preop. EXAM: CT ABDOMEN AND PELVIS WITHOUT CONTRAST TECHNIQUE: Multidetector CT imaging of the abdomen and pelvis was performed following the standard protocol without IV contrast. COMPARISON:  None. FINDINGS: Lower chest: Minimal dependent atelectasis. Mild wall thickening of the distal esophagus. Coronary artery calcifications. Pericardial calcifications are also noted along the lateral aspect of the left ventricle. There are nonspecific calcifications at the edge of the right atrium. Hepatobiliary: Unremarkable liver. Multiple gallstones are noted. Gallbladder is decompressed. Pancreas: Unremarkable Spleen: Unremarkable Adrenals/Urinary Tract: 1.6 cm left adrenal  nodule on image 24. It is nonspecific. Small renal cysts. There are calcifications within the right kidney that are nonspecific. Other hypodensities in the kidneys are too small to characterize. Unremarkable bladder. Stomach/Bowel: Stomach is decompressed. Normal  appendix. No obvious mass in the colon. Diverticulosis of the descending and sigmoid colon. There is no evidence of small-bowel obstruction. Vascular/Lymphatic: Aorto bi-iliac stent graft, excluding an aortic aneurysm, is in place. Contrast was not administered for this study and patency cannot be assessed. Endoleak cannot be assessed. Maximal AP and transverse diameters of the aortic aneurysm sac are 5.3 and 5.3 cm. Reproductive: Unremarkable prostate. Other: No free fluid.  No evidence of retroperitoneal hemorrhage. Musculoskeletal: There is no vertebral compression deformity. Facet arthropathy in the lumbar spine is noted. IMPRESSION: Noncontrast study demonstrates an aorto bi-iliac stent graft which excludes an aortic aneurysm. Maximal aneurysm sac diameter is 5.3 cm. Nonspecific left adrenal nodule. Cholelithiasis. Electronically Signed   By: Marybelle Killings M.D.   On: 09/29/2017 12:33   Dg Elbow Complete Left  Result Date: 09/29/2017 CLINICAL DATA:  Left elbow injury after fall today. EXAM: LEFT ELBOW - COMPLETE 3+ VIEW COMPARISON:  None. FINDINGS: There is no evidence of fracture, dislocation, or joint effusion. There is no evidence of arthropathy. Moderate spurring of olecranon is noted. Soft tissues are unremarkable. IMPRESSION: No acute abnormality seen in the left elbow. Electronically Signed   By: Marijo Conception, M.D.   On: 09/29/2017 11:45   Dg Chest Port 1 View  Result Date: 09/29/2017 CLINICAL DATA:  Fall today. EXAM: PORTABLE CHEST 1 VIEW COMPARISON:  Radiographs of Jun 17, 2012. FINDINGS: Stable cardiomediastinal silhouette. Atherosclerosis of thoracic aorta is noted. Interval placement of right internal jugular dialysis catheter with distal tip in expected position of cavoatrial junction. No pneumothorax or pleural effusion is noted. Both lungs are clear. The visualized skeletal structures are unremarkable. IMPRESSION: Interval placement of right internal jugular dialysis catheter with distal  tip in expected position of cavoatrial junction. No acute cardiopulmonary abnormality seen. Electronically Signed   By: Marijo Conception, M.D.   On: 09/29/2017 11:47   Dg Hand Complete Left  Result Date: 09/29/2017 CLINICAL DATA:  Left hand injury after fall today. EXAM: LEFT HAND - COMPLETE 3+ VIEW COMPARISON:  None. FINDINGS: There is no evidence of fracture or dislocation. There is no evidence of arthropathy or other focal bone abnormality. Soft tissues are unremarkable. IMPRESSION: No significant abnormality seen in the left hand. Electronically Signed   By: Marijo Conception, M.D.   On: 09/29/2017 11:44   ASSESSMENT AND PLAN:   Brent Glover  is a 82 y.o. male with a known history of BPH, chronic diastolic CHF, chronic kidney disease stage III, COPD, diabetes type 2, end-stage renal disease on hemodialysis who is presenting with complaint of generalized weakness and fall times twice.  1. Generalized weakness likely due to symptomatic anemia resulting in falls w/o trauma -I have consented patient to be transfused 1 unit of packed RBCs he is agreeable to transfusion risk and benefits explained - remains in SR -hgb 7.3--6.0--1 unit BT today -GI to see pt -at baseline hgb is 7.8-9.0 -colonoscopy in 2010-- nothing acute  2. essential hypertension continue Coreg as well as amlodipine  3.  History of congestive heart failure continue spironolactone and Coreg  4. Diabetes type 2 Place patient on sliding scale insulin  5.  GERD continue PPIs  6.  SCDs for DVT prophylaxis  7. ESRD on HD just started 1  month ago  Case discussed with Care Management/Social Worker. Management plans discussed with the patient, family and they are in agreement.  CODE STATUS: FUll  DVT Prophylaxis:SCD  TOTAL TIME TAKING CARE OF THIS PATIENT: **30 minutes.  >50% time spent on counselling and coordination of care  POSSIBLE D/C IN *1-2 DAYS, DEPENDING ON CLINICAL CONDITION.  Note: This dictation was  prepared with Dragon dictation along with smaller phrase technology. Any transcriptional errors that result from this process are unintentional.  Fritzi Mandes M.D on 09/30/2017 at 1:15 PM  Between 7am to 6pm - Pager - [phone]  After 6pm go to [email or web address] - password [email or web address] [facility]  Office  [phone]  CC: Primary care physician; Christen Bame, DOPatient ID: Brent Glover, male   DOB: 21-Mar-1935, 82 y.o.   MRN: [id number]

## 2017-09-30 NOTE — Progress Notes (Signed)
Notified about dark stools. Hb is 7.3 and patient is dizzy. HR wnl although increasing.  Notably, patient taking iron supplement. Advised bed rest for now so not to exacerbate dizziness and decrease risk of fall. Plan was to receive blood today but I do not see that the patient has gotten a transfusion. Discuss with day team (nephrology and hospitalist) to coordinate blood transfusion.

## 2017-09-30 NOTE — Consult Note (Signed)
[facility] Nurse wound consult note Reason for Consult: Skin tears Grade 2 at bilateral elbows and at left hand, 4th digit. Wound type:Trauma Pressure Injury POA: NA Measurement: Right elbow: 1.2cm x 1.8cm x 0.1cm red, moist, scant exudate Left elbow: 2cm x 2.6cm x 0.1cm red, moist, scant exudate Left hand, 4th digit at base (web space): 1cm x 0.4cm x 0.1cm tear.  Red base, dry. No exudate. Wound bed:As described above Drainage (amount, consistency, odor) As described above Periwound:intact, dry with ecchymosis and purpura Dressing procedure/placement/frequency: I will discontinue the silicone foam dressings in favor of white petrolatum with dry gauze topper, changed daily.  Secure with a few turns of roll gauze and paper tape. May use just paper tape to secure at finger.  [facility] nursing team will not follow, but will remain available to this patient, the nursing and medical teams.  Please re-consult if needed. Thanks, Maudie Flakes, MSN, RN, [place], [facility]  Pager# [phone]

## 2017-09-30 NOTE — Plan of Care (Signed)
  Problem: Education: Goal: Knowledge of General Education information will improve Description: Including pain rating scale, medication(s)/side effects and non-pharmacologic comfort measures Outcome: Progressing   Problem: Health Behavior/Discharge Planning: Goal: Ability to manage health-related needs will improve Outcome: Progressing   Problem: Safety: Goal: Ability to remain free from injury will improve Outcome: Progressing   

## 2017-10-01 DIAGNOSIS — D500 Iron deficiency anemia secondary to blood loss (chronic): Secondary | ICD-10-CM

## 2017-10-01 DIAGNOSIS — D649 Anemia, unspecified: Secondary | ICD-10-CM

## 2017-10-01 LAB — CBC
HCT: 23.7 % — ABNORMAL LOW (ref 40.0–52.0)
HEMOGLOBIN: 8.3 g/dL — AB (ref 13.0–18.0)
MCH: 32.3 pg (ref 26.0–34.0)
MCHC: 35.1 g/dL (ref 32.0–36.0)
MCV: 91.9 fL (ref 80.0–100.0)
Platelets: 112 10*3/uL — ABNORMAL LOW (ref 150–440)
RBC: 2.58 MIL/uL — AB (ref 4.40–5.90)
RDW: 16.4 % — ABNORMAL HIGH (ref 11.5–14.5)
WBC: 6.5 10*3/uL (ref 3.8–10.6)

## 2017-10-01 LAB — HEMOGLOBIN AND HEMATOCRIT, BLOOD
HCT: 16.8 % — ABNORMAL LOW (ref 40.0–52.0)
HEMOGLOBIN: 5.7 g/dL — AB (ref 13.0–18.0)

## 2017-10-01 LAB — GLUCOSE, CAPILLARY
GLUCOSE-CAPILLARY: 90 mg/dL (ref 70–99)
GLUCOSE-CAPILLARY: 110 mg/dL — AB (ref 70–99)
GLUCOSE-CAPILLARY: 133 mg/dL — AB (ref 70–99)
GLUCOSE-CAPILLARY: 100 mg/dL — AB (ref 70–99)
GLUCOSE-CAPILLARY: 101 mg/dL — AB (ref 70–99)

## 2017-10-01 LAB — RENAL FUNCTION PANEL
ANION GAP: 7 (ref 5–15)
Albumin: 2.9 g/dL — ABNORMAL LOW (ref 3.5–5.0)
BUN: 69 mg/dL — ABNORMAL HIGH (ref 8–23)
CALCIUM: 7.8 mg/dL — AB (ref 8.9–10.3)
CO2: 27 mmol/L (ref 22–32)
Chloride: 101 mmol/L (ref 98–111)
Creatinine, Ser: 5.52 mg/dL — ABNORMAL HIGH (ref 0.61–1.24)
GFR, EST AFRICAN AMERICAN: 10 mL/min — AB (ref >60)
GFR, EST NON AFRICAN AMERICAN: 9 mL/min — AB (ref >60)
Glucose, Bld: 99 mg/dL (ref 70–99)
Phosphorus: 3.9 mg/dL (ref 2.5–4.6)
Potassium: 5.6 mmol/L — ABNORMAL HIGH (ref 3.5–5.1)
SODIUM: 135 mmol/L (ref 135–145)

## 2017-10-01 LAB — VITAMIN B12: VITAMIN B 12: 447 pg/mL (ref 180–914)

## 2017-10-01 LAB — IRON AND TIBC
Iron: 79 ug/dL (ref 45–182)
Saturation Ratios: 43 % — ABNORMAL HIGH (ref 17.9–39.5)
TIBC: 186 ug/dL — ABNORMAL LOW (ref 250–450)
UIBC: 107 ug/dL

## 2017-10-01 LAB — URINALYSIS, COMPLETE (UACMP) WITH MICROSCOPIC
BACTERIA UA: NONE SEEN
Bilirubin Urine: NEGATIVE
Glucose, UA: NEGATIVE mg/dL
Hgb urine dipstick: NEGATIVE
Ketones, ur: NEGATIVE mg/dL
Leukocytes, UA: NEGATIVE
Nitrite: NEGATIVE
PROTEIN: 100 mg/dL — AB
Specific Gravity, Urine: 1.006 (ref 1.005–1.030)
WBC, UA: NONE SEEN WBC/hpf (ref 0–5)
pH: 8 (ref 5.0–8.0)

## 2017-10-01 LAB — HEMOGLOBIN: HEMOGLOBIN: 6.2 g/dL — AB (ref 13.0–18.0)

## 2017-10-01 LAB — PREPARE RBC (CROSSMATCH)

## 2017-10-01 LAB — FERRITIN: Ferritin: 821 ng/mL — ABNORMAL HIGH (ref 24–336)

## 2017-10-01 LAB — FOLATE: FOLATE: 6.9 ng/mL (ref >5.9)

## 2017-10-01 MED ORDER — SODIUM CHLORIDE 0.9% IV SOLUTION: Freq: Once | INTRAVENOUS | Status: DC

## 2017-10-01 MED ORDER — CHLORHEXIDINE GLUCONATE CLOTH 2 % EX PADS: 6.0000 | MEDICATED_PAD | Freq: Every day | CUTANEOUS | Status: DC

## 2017-10-01 NOTE — Progress Notes (Signed)
[facility]  ROUNDING NOTE   Subjective:   Hemoglobin dropped to 5.7 - PRBC transfusion ordered.    Objective:  Vital signs in last 24 hours:  Temp:  [97.5 F (36.4 C)-99.1 F (37.3 C)] 98.6 F (37 C) (08/26 1140) Pulse Rate:  [61-103] 62 (08/26 1140) Resp:  [16-19] 19 (08/26 1140) BP: (87-134)/(40-71) 134/57 (08/26 1140) SpO2:  [92 %-99 %] 97 % (08/26 1140) Weight:  [74.5 kg] 74.5 kg (08/26 0511)  Weight change: 1.452 kg Filed Weights   09/29/17 1507 09/29/17 2054 10/01/17 0511  Weight: 74.2 kg 76.9 kg 74.5 kg    Intake/Output: I/O last 3 completed shifts: In: 47 [P.O.:240; Blood:370] Out: 54 [Urine:300]   Intake/Output this shift:  Total I/O In: 270 [P.O.:240; Blood:30] Out: 290 [Urine:290]  Physical Exam: General: No acute distress  Head: Normocephalic, atraumatic. Moist oral mucosal membranes  Eyes: Anicteric  Neck: Supple, trachea midline  Lungs:  Clear to auscultation, normal effort  Heart: S1S2 no rubs  Abdomen:  Soft, nontender, bowel sounds present  Extremities: Trace peripheral edema.  Neurologic: Awake, alert, following commands  Skin: No lesions  Access: IJ permcath in place    Basic Metabolic Panel: Recent Labs  Lab 09/29/17 1120 09/29/17 1854 09/30/17 0434  NA 138  --  139  K 5.1  --  4.2  CL 99  --  98  CO2 29  --  33*  GLUCOSE 158*  --  96  BUN 98*  --  45*  CREATININE 5.25*  --  3.34*  CALCIUM 9.4  --  8.3*  PHOS  --  1.6*  --     Liver Function Tests: Recent Labs  Lab 09/29/17 1120  AST 17  ALT 11  ALKPHOS 64  BILITOT 0.2*  PROT 6.1*  ALBUMIN 3.0*   Recent Labs  Lab 09/29/17 1120  LIPASE 39   No results for input(s): AMMONIA in the last 168 hours.  CBC: Recent Labs  Lab 09/29/17 1120 09/30/17 0434 10/01/17 0439 10/01/17 0947  WBC 8.9 7.2  --   --   HGB 7.3* 6.0* 6.2* 5.7*  HCT 21.5* 17.3*  --  16.8*  MCV 97.1 96.3  --   --   PLT 110* 95*  --   --     Cardiac Enzymes: Recent Labs  Lab  09/29/17 1120  TROPONINI 0.05*    BNP: Invalid input(s): POCBNP  CBG: Recent Labs  Lab 09/30/17 1605 09/30/17 2119 10/01/17 0437 10/01/17 0848 10/01/17 1115  GLUCAP 91 78 90 110* 133*    Microbiology: No results found for this or any previous visit.  Coagulation Studies: No results for input(s): LABPROT, INR in the last 72 hours.  Urinalysis: Recent Labs    10/01/17 0710  COLORURINE STRAW*  LABSPEC 1.006  PHURINE 8.0  GLUCOSEU NEGATIVE  HGBUR NEGATIVE  BILIRUBINUR NEGATIVE  KETONESUR NEGATIVE  PROTEINUR 100*  NITRITE NEGATIVE  LEUKOCYTESUR NEGATIVE      Imaging: No results found.   Medications:   . sodium chloride    . [START ON 10/02/2017] iron sucrose     . sodium chloride   Intravenous Once  . sodium chloride   Intravenous Once  . sodium chloride   Intravenous Once  . amLODipine  5 mg Oral Daily  . aspirin EC  81 mg Oral Daily  . atorvastatin  80 mg Oral Daily  . carvedilol  6.25 mg Oral BID WC  . escitalopram  20 mg Oral Daily  .  ferrous sulfate  325 mg Oral Q breakfast  . insulin aspart  0-9 Units Subcutaneous TID WC  . magnesium oxide  400 mg Oral Daily  . Melatonin  5 mg Oral QHS  . [START ON 10/02/2017] midodrine  10 mg Oral Q T,Th,Sa-HD  . [START ON 10/02/2017] midodrine  5 mg Oral Q T,Th,Sa-HD  . pantoprazole  40 mg Oral Daily  . rOPINIRole  0.5 mg Oral QHS  . sodium chloride flush  3 mL Intravenous Q12H  . spironolactone  12.5 mg Oral QHS  . tamsulosin  0.4 mg Oral Daily  . [START ON 10/05/2017] Vitamin D (Ergocalciferol)  50,000 Units Oral Q Fri   sodium chloride, acetaminophen **OR** acetaminophen, fluticasone, ondansetron **OR** ondansetron (ZOFRAN) IV, sodium chloride flush  Assessment/ Plan:  82 y.o. 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   [facility]/ [place]  1.  ESRD on HD TTS.   Hemodialysis for tomorrow.   2.  Anemia of chronic kidney disease with acute anemia blood loss.    - GI consult  - PRBC transfusion.   3.  Secondary hyperparathyroidism. Low phosphorus readings - Not currently on binders.   4.  Hypertension: with midodrine for hemodialysis related hypotension BP 134/57 - at goal - spironolactone, carvedilol and amlodipine.    LOS: 1 [place] Basham 8/26/20192:14 PM

## 2017-10-01 NOTE — Plan of Care (Signed)
  Problem: Health Behavior/Discharge Planning: Goal: Ability to manage health-related needs will improve Outcome: Progressing Note:  Gastroenterologist to see patient soon, hopefully by the end of the day. Patient's hemoglobin has dropped from 6.2 to 5.7 despite receiving 1 unit of RBC's from me yesterday. Will continue to monitor. Wenda Low [facility]

## 2017-10-01 NOTE — Progress Notes (Signed)
PT Cancellation Note  Patient Details Name: Eward Rutigliano MRN: [id number] DOB: 1935-10-19   Cancelled Treatment:    Reason Eval/Treat Not Completed: Other (comment) Pt hbg 6.2 after transfusion yesterday and still positive with orthostatics.  Second blood transfusion has been ordered, and PT will hold until pt is medically stable.   Rosario Adie, SPT    Rosario Adie 10/01/2017, 8:15 AM

## 2017-10-01 NOTE — Progress Notes (Addendum)
Pt orthostatic hypotension was positive. Will notify incoming nurse. Will continue to monitor.  Update 0643: Pt Hgb was at 6.2. Notify [facility]. Awaiting callback. Will continue to montiro.

## 2017-10-01 NOTE — Care Management (Signed)
Initially placed in observation for syncope.  Since then has received transfusion for anemia.  Hemoglobin 8/26 5.7.  GI consulting. At present no anticipating EGD or colonoscopy. May consider capsule study. Physical therapy eval contraindicated at present due to hemoglobin.  ESRD with chronic HD

## 2017-10-01 NOTE — Plan of Care (Signed)

## 2017-10-01 NOTE — Progress Notes (Signed)
[place] [facility] [place] NAME: Brent Glover    MR#:  [id number]  DATE OF BIRTH:  [birth year]  SUBJECTIVE:   Came in with increasing weakness and found to have low hgb Pt reports dizziness, denies any chest  Pain, sob, hb dropped to 5.7 today REVIEW OF SYSTEMS:   Review of Systems  Constitutional: Negative for chills, fever and weight loss.  HENT: Negative for ear discharge, ear pain and nosebleeds.   Eyes: Negative for blurred vision, pain and discharge.  Respiratory: Negative for sputum production, shortness of breath, wheezing and stridor.   Cardiovascular: Negative for chest pain, palpitations, orthopnea and PND.  Gastrointestinal: Negative for abdominal pain, diarrhea, nausea and vomiting.  Genitourinary: Negative for frequency and urgency.  Musculoskeletal: Negative for back pain and joint pain.  Neurological: Positive for weakness. Negative for sensory change, speech change and focal weakness.  Psychiatric/Behavioral: Negative for depression and hallucinations. The patient is not nervous/anxious.    Tolerating Diet:yes Tolerating PT: pending  DRUG ALLERGIES:   Allergies  Allergen Reactions  . Montelukast Shortness Of Breath  . Trazodone Other (See Comments)    "it hypes me up instead of putting me to sleep"  . Hydrocodone Nausea And Vomiting    VITALS:  Blood pressure 129/71, pulse 69, temperature 98.2 F (36.8 C), temperature source Oral, resp. rate 18, height 5\' 7"  (1.702 m), weight 74.5 kg, SpO2 97 %.  PHYSICAL EXAMINATION:   Physical Exam  GENERAL:  82 y.o.-year-old patient lying in the bed with no acute distress. Pallor+ EYES: Pupils equal, round, reactive to light and accommodation. No scleral icterus. Extraocular muscles intact.  HEENT: Head atraumatic, normocephalic. Oropharynx and nasopharynx clear.  NECK:  Supple, no jugular venous distention. No thyroid enlargement, no tenderness.  LUNGS: Normal breath  sounds bilaterally, no wheezing, rales, rhonchi. No use of accessory muscles of respiration.  CARDIOVASCULAR: S1, S2 normal. No murmurs, rubs, or gallops.  ABDOMEN: Soft, nontender, nondistended. Bowel sounds present. No organomegaly or mass.  EXTREMITIES: No cyanosis, clubbing or edema b/l.    NEUROLOGIC: Cranial nerves II through XII are intact. No focal Motor or sensory deficits b/l.   PSYCHIATRIC:  patient is alert and oriented x 3.  SKIN: No obvious rash, lesion, or ulcer.   LABORATORY PANEL:  CBC Recent Labs  Lab 09/30/17 0434  10/01/17 0947  WBC 7.2  --   --   HGB 6.0*   < > 5.7*  HCT 17.3*  --  16.8*  PLT 95*  --   --    < > = values in this interval not displayed.    Chemistries  Recent Labs  Lab 09/29/17 1120 09/30/17 0434  NA 138 139  K 5.1 4.2  CL 99 98  CO2 29 33*  GLUCOSE 158* 96  BUN 98* 45*  CREATININE 5.25* 3.34*  CALCIUM 9.4 8.3*  AST 17  --   ALT 11  --   ALKPHOS 64  --   BILITOT 0.2*  --    Cardiac Enzymes Recent Labs  Lab 09/29/17 1120  TROPONINI 0.05*   RADIOLOGY:  Ct Abdomen Pelvis Wo Contrast  Result Date: 09/29/2017 CLINICAL DATA:  Abdominal aortic aneurysm.  Preop. EXAM: CT ABDOMEN AND PELVIS WITHOUT CONTRAST TECHNIQUE: Multidetector CT imaging of the abdomen and pelvis was performed following the standard protocol without IV contrast. COMPARISON:  None. FINDINGS: Lower chest: Minimal dependent atelectasis. Mild wall thickening of the distal esophagus. Coronary artery calcifications. Pericardial calcifications  are also noted along the lateral aspect of the left ventricle. There are nonspecific calcifications at the edge of the right atrium. Hepatobiliary: Unremarkable liver. Multiple gallstones are noted. Gallbladder is decompressed. Pancreas: Unremarkable Spleen: Unremarkable Adrenals/Urinary Tract: 1.6 cm left adrenal nodule on image 24. It is nonspecific. Small renal cysts. There are calcifications within the right kidney that are  nonspecific. Other hypodensities in the kidneys are too small to characterize. Unremarkable bladder. Stomach/Bowel: Stomach is decompressed. Normal appendix. No obvious mass in the colon. Diverticulosis of the descending and sigmoid colon. There is no evidence of small-bowel obstruction. Vascular/Lymphatic: Aorto bi-iliac stent graft, excluding an aortic aneurysm, is in place. Contrast was not administered for this study and patency cannot be assessed. Endoleak cannot be assessed. Maximal AP and transverse diameters of the aortic aneurysm sac are 5.3 and 5.3 cm. Reproductive: Unremarkable prostate. Other: No free fluid.  No evidence of retroperitoneal hemorrhage. Musculoskeletal: There is no vertebral compression deformity. Facet arthropathy in the lumbar spine is noted. IMPRESSION: Noncontrast study demonstrates an aorto bi-iliac stent graft which excludes an aortic aneurysm. Maximal aneurysm sac diameter is 5.3 cm. Nonspecific left adrenal nodule. Cholelithiasis. Electronically Signed   By: Marybelle Killings M.D.   On: 09/29/2017 12:33   Dg Elbow Complete Left  Result Date: 09/29/2017 CLINICAL DATA:  Left elbow injury after fall today. EXAM: LEFT ELBOW - COMPLETE 3+ VIEW COMPARISON:  None. FINDINGS: There is no evidence of fracture, dislocation, or joint effusion. There is no evidence of arthropathy. Moderate spurring of olecranon is noted. Soft tissues are unremarkable. IMPRESSION: No acute abnormality seen in the left elbow. Electronically Signed   By: Marijo Conception, M.D.   On: 09/29/2017 11:45   Dg Chest Port 1 View  Result Date: 09/29/2017 CLINICAL DATA:  Fall today. EXAM: PORTABLE CHEST 1 VIEW COMPARISON:  Radiographs of Jun 17, 2012. FINDINGS: Stable cardiomediastinal silhouette. Atherosclerosis of thoracic aorta is noted. Interval placement of right internal jugular dialysis catheter with distal tip in expected position of cavoatrial junction. No pneumothorax or pleural effusion is noted. Both lungs  are clear. The visualized skeletal structures are unremarkable. IMPRESSION: Interval placement of right internal jugular dialysis catheter with distal tip in expected position of cavoatrial junction. No acute cardiopulmonary abnormality seen. Electronically Signed   By: Marijo Conception, M.D.   On: 09/29/2017 11:47   Dg Hand Complete Left  Result Date: 09/29/2017 CLINICAL DATA:  Left hand injury after fall today. EXAM: LEFT HAND - COMPLETE 3+ VIEW COMPARISON:  None. FINDINGS: There is no evidence of fracture or dislocation. There is no evidence of arthropathy or other focal bone abnormality. Soft tissues are unremarkable. IMPRESSION: No significant abnormality seen in the left hand. Electronically Signed   By: Marijo Conception, M.D.   On: 09/29/2017 11:44   ASSESSMENT AND PLAN:   Brent Glover  is a 82 y.o. male with a known history of BPH, chronic diastolic CHF, chronic kidney disease stage III, COPD, diabetes type 2, end-stage renal disease on hemodialysis who is presenting with complaint of generalized weakness and fall times twice.  1. Generalized weakness likely due to symptomatic anemia resulting in falls w/o trauma -I have consented patient to be transfused 1 unit of packed RBCs he is agreeable to transfusion risk and benefits explained - remains in SR -hgb 7.3--6.0--5.7  , 1 unit BT today -GI consult placed , reports some melena -at baseline hgb is 7.8-9.0 -colonoscopy in 2015-- nothing acute per pt -PPI -iron studies ,  B12,folate  2. essential hypertension continue Coreg as well as amlodipine  3.  History of congestive heart failure continue spironolactone and Coreg  4. Diabetes type 2 Place patient on sliding scale insulin  5.  GERD continue PPIs  6.  SCDs for DVT prophylaxis  7. ESRD on HD just started 1 month ago, F/U WITH NEPHRO  Case discussed with Care Management/Social Worker. Management plans discussed with the patient, family and they are in agreement.  CODE  STATUS: FUll  DVT Prophylaxis:SCD  TOTAL TIME TAKING CARE OF THIS PATIENT: 33 minutes.  >50% time spent on counselling and coordination of care  POSSIBLE D/C IN *1-2 DAYS, DEPENDING ON CLINICAL CONDITION.  Note: This dictation was prepared with Dragon dictation along with smaller phrase technology. Any transcriptional errors that result from this process are unintentional.  [name] M.D on 10/01/2017 at 10:20 AM  Between 7am to 6pm - Pager - [phone]  After 6pm go to [email or web address] - password [email or web address] [facility]  Office  [phone]  CC: Primary care physician; Christen Bame, DOPatient ID: Brent Glover, male   DOB: [birth year], 82 y.o.   MRN: [id number]

## 2017-10-01 NOTE — Plan of Care (Signed)

## 2017-10-01 NOTE — Consult Note (Signed)
Brent Lame, MD [facility]  [address], [place], [place] [address] Phone: [phone] Fax : [phone]  Consultation  Referring Provider:     Dr. Mar Daring Primary Care Physician:  Christen Bame, DO Primary Gastroenterologist:  Dr. @[facility]          Reason for Consultation:     Symptomatic anemia  Date of Admission:  09/29/2017 Date of Consultation:  10/01/2017         HPI:   Brent Glover is a 82 y.o. male Who has a history of chronic renal disease and long-standing anemia.  The patient was admitted with symptomatic anemia.  The patient has had a workup including an EGD and colonoscopy a little over a year ago. The patient reports that he has been on iron and has had chronic black stools. The patient was admitted with a hemoglobin of 7.3 that was 5.7 this morning. The patient's ferritin was 821.  The patient's iron study showed a normal iron with a low TIBC and a high saturation. There does not appear to have been any stool sent off for blood. The patient also had a normal folate and B12 study.  The patient MCV was also normal. The patient's EGD done last year showed a single AVM in the stomach that was treated with argon plasma coagulation. In July the patient had Hemoccult negative stools.  I'm now being asked to see the patient for symptomatic anemia.  Past Medical History:  Diagnosis Date  . BPH (benign prostatic hyperplasia)   . Chronic diastolic heart failure ([place])   . COPD (chronic obstructive pulmonary disease) ([place])   . Diabetes mellitus without complication ([place])   . ESRD (end stage renal disease) ([place])    stage III  . Hyperlipidemia   . Hypertension     Past Surgical History:  Procedure Laterality Date  . ABDOMINAL AORTIC ENDOVASCULAR STENT GRAFT    . CYSTOSCOPY    . KNEE ARTHROPLASTY      Prior to Admission medications   Medication Sig Start Date End Date Taking? Authorizing Provider  acetaminophen (TYLENOL) 500 MG tablet Take 500 mg by mouth every 6  (six) hours as needed for mild pain.   Yes [provider]  aspirin 81 MG tablet Take 81 mg by mouth daily.   Yes [provider]  atorvastatin (LIPITOR) 80 MG tablet Take 80 mg by mouth daily. 08/17/17  Yes [provider]  carvedilol (COREG) 6.25 MG tablet Take 6.25 mg by mouth 2 (two) times daily with a meal.   Yes [provider]  ergocalciferol (VITAMIN D2) 50000 units capsule Take 50,000 Units by mouth every Friday.   Yes [provider]  escitalopram (LEXAPRO) 10 MG tablet Take 20 mg by mouth daily.  12/21/14 09/30/18 Yes [provider]  ferrous sulfate 325 (65 FE) MG tablet Take 325 mg by mouth daily with breakfast.   Yes [provider]  fluticasone (FLONASE) 50 MCG/ACT nasal spray Place 2 sprays into both nostrils daily as needed for allergies. 07/03/17  Yes [provider]  lisinopril (PRINIVIL,ZESTRIL) 10 MG tablet Take 10 mg by mouth at bedtime.  11/24/14 09/30/18 Yes [provider]  magnesium oxide (MAG-OX) 400 MG tablet Take 400 mg by mouth daily. 03/06/17 03/06/18 Yes [provider]  Melatonin 5 MG TABS Take 5 mg by mouth at bedtime.   Yes [provider]  midodrine (PROAMATINE) 5 MG tablet Take 5-10 mg by mouth Every Tuesday,Thursday,and Saturday with dialysis.  Give 10 mg by mouth 30 minutes before dialysis and 5 mg by mouth half way through dialysis.   Yes [provider]  pantoprazole (PROTONIX) 40 MG tablet Take 40 mg by mouth daily.   Yes [provider]  rOPINIRole (REQUIP) 0.25 MG tablet Take 0.5 mg by mouth at bedtime. Reported on 02/10/2015   Yes [provider]  spironolactone (ALDACTONE) 25 MG tablet Take 12.5 mg by mouth at bedtime.   Yes [provider]  tamsulosin (FLOMAX) 0.4 MG CAPS Take 0.4 mg by mouth daily.   Yes [provider]  amLODipine (NORVASC) 10 MG tablet Take 0.5 tablets (5 mg total) by mouth daily. Patient not taking:  Reported on 09/29/2017 06/25/12   Wellington Hampshire, MD  carvedilol (COREG) 25 MG tablet Take 0.5 tablets (12.5 mg total) by mouth 2 (two) times daily with a meal. Patient not taking: Reported on 09/29/2017 06/25/12   Wellington Hampshire, MD  furosemide (LASIX) 20 MG tablet Take 1 tablet (20 mg total) by mouth daily. Patient not taking: Reported on 09/29/2017 06/27/12   Wellington Hampshire, MD    History reviewed. No pertinent family history.   Social History   Tobacco Use  . Smoking status: Former Smoker    Packs/day: 1.00    Years: 60.00    Pack years: 60.00    Types: Cigarettes    Last attempt to quit: 10/11/2014    Years since quitting: 2.9  . Smokeless tobacco: Never Used  Substance Use Topics  . Alcohol use: No  . Drug use: No    Allergies as of 09/29/2017 - Review Complete 09/29/2017  Allergen Reaction Noted  . Montelukast Shortness Of Breath 02/10/2015  . Trazodone Other (See Comments) 02/10/2015  . Hydrocodone Nausea And Vomiting 02/10/2015    Review of Systems:    All systems reviewed and negative except where noted in HPI.   Physical Exam:  Vital signs in last 24 hours: Temp:  [97.7 F (36.5 C)-99.1 F (37.3 C)] 98.6 F (37 C) (08/26 1611) Pulse Rate:  [59-103] 59 (08/26 1611) Resp:  [16-19] 19 (08/26 1611) BP: (87-142)/(40-71) 140/66 (08/26 1611) SpO2:  [92 %-97 %] 96 % (08/26 1611) Weight:  [74.5 kg] 74.5 kg (08/26 0511) Last BM Date: 09/29/17 General:   Pleasant, cooperative in NAD Head:  Normocephalic and atraumatic. Eyes:   No icterus.   Conjunctiva pink. PERRLA. Ears:  Normal auditory acuity. Neck:  Supple; no masses or thyroidomegaly Lungs: Respirations even and unlabored. Lungs clear to auscultation bilaterally.   No wheezes, crackles, or rhonchi.  Heart:  Regular rate and rhythm;  Without murmur, clicks, rubs or gallops Abdomen:  Soft, nondistended, nontender. Normal bowel sounds. No appreciable masses or hepatomegaly.  No rebound or guarding.  Rectal:   Not performed. Msk:  Symmetrical without gross deformities.    Extremities:  Without edema, cyanosis or clubbing. Neurologic:  Alert and oriented x3;  grossly normal neurologically. Skin:  Intact without significant lesions or rashes. Cervical Nodes:  No significant cervical adenopathy. Psych:  Alert and cooperative. Normal affect.  LAB RESULTS: Recent Labs    09/29/17 1120 09/30/17 0434 10/01/17 0439 10/01/17 0947  WBC 8.9 7.2  --   --   HGB 7.3* 6.0* 6.2* 5.7*  HCT 21.5* 17.3*  --  16.8*  PLT 110* 95*  --   --    BMET Recent Labs    09/29/17 1120 09/30/17 0434  NA 138 139  K 5.1 4.2  CL  99 98  CO2 29 33*  GLUCOSE 158* 96  BUN 98* 45*  CREATININE 5.25* 3.34*  CALCIUM 9.4 8.3*   LFT Recent Labs    09/29/17 1120  PROT 6.1*  ALBUMIN 3.0*  AST 17  ALT 11  ALKPHOS 64  BILITOT 0.2*   PT/INR No results for input(s): LABPROT, INR in the last 72 hours.  STUDIES: No results found.    Impression / Plan:   Assessment: Active Problems:   Syncope and collapse   Acute on chronic anemia   Brent Glover is a 82 y.o. y/o male with Symptomatic anemia with chronic renal disease and a workup with an EGD and colonoscopy at [facility] last year.  The patient had Hemoccult negative stools back in July of this year.  The patient's iron studies show him to have normal iron studies with a low TIBC.  The patient's MCV is also normal.   Plan: The patient will have his stools checked again for blood but with his recent workup just over a year ago and his negative Hemoccult stools last month I do not think a GI workup is warranted in this patient. The patient's black stools are likely from the chronic iron he has been taking.  I discussed the plan with the hospitalist.  If there is any sign of GI bleeding or if his stools come back as Hemoccult positive then a small bowel capsule endoscopy could be considered.  Thank you for involving me in the care of this patient.      LOS: 1 day    Brent Lame, MD  10/01/2017, 4:50 PM    Note: This dictation was prepared with Dragon dictation along with smaller phrase technology. Any transcriptional errors that result from this process are unintentional.

## 2017-10-02 LAB — CBC
HCT: 23.6 % — ABNORMAL LOW (ref 40.0–52.0)
Hemoglobin: 8.3 g/dL — ABNORMAL LOW (ref 13.0–18.0)
MCH: 32.5 pg (ref 26.0–34.0)
MCHC: 35.4 g/dL (ref 32.0–36.0)
MCV: 92 fL (ref 80.0–100.0)
PLATELETS: 106 10*3/uL — AB (ref 150–440)
RBC: 2.57 MIL/uL — ABNORMAL LOW (ref 4.40–5.90)
RDW: 16.7 % — AB (ref 11.5–14.5)
WBC: 5.8 10*3/uL (ref 3.8–10.6)

## 2017-10-02 LAB — TYPE AND SCREEN
ABO/RH(D): O POS
ANTIBODY SCREEN: NEGATIVE
UNIT DIVISION: 0
Unit division: 0
Unit division: 0

## 2017-10-02 LAB — BPAM RBC
BLOOD PRODUCT EXPIRATION DATE: 201908292359
Blood Product Expiration Date: 201908282359
Blood Product Expiration Date: 201909102359
ISSUE DATE / TIME: 201908261106
ISSUE DATE / TIME: 201908261535
ISSUE DATE / TIME: 201908251407
UNIT TYPE AND RH: 5100
Unit Type and Rh: 9500
Unit Type and Rh: 9500

## 2017-10-02 LAB — GLUCOSE, CAPILLARY
GLUCOSE-CAPILLARY: 82 mg/dL (ref 70–99)
Glucose-Capillary: 88 mg/dL (ref 70–99)
Glucose-Capillary: 121 mg/dL — ABNORMAL HIGH (ref 70–99)
Glucose-Capillary: 105 mg/dL — ABNORMAL HIGH (ref 70–99)

## 2017-10-02 LAB — POTASSIUM: POTASSIUM: 5 mmol/L (ref 3.5–5.1)

## 2017-10-02 MED ORDER — EPOETIN ALFA 10000 UNIT/ML IJ SOLN
10000.0000 [IU] | Freq: Once | INTRAMUSCULAR | Status: AC
  Administered 2017-10-02: 10000 [IU] via SUBCUTANEOUS

## 2017-10-02 NOTE — Progress Notes (Signed)
Patient has no acute event overnight. Received 2 units of PRBC on day shift. Q6 H/H per order, hemoglobin now 8.3 twice. Will continue to monitor.

## 2017-10-02 NOTE — Progress Notes (Signed)
HD tx end    10/02/17 1118  Vital Signs  Pulse Rate 66  Pulse Rate Source Monitor  Resp 19  BP (!) 156/65  BP Location Right Arm  BP Method Automatic  Patient Position (if appropriate) Lying  Oxygen Therapy  SpO2 98 %  O2 Device Room Air  During Hemodialysis Assessment  Dialysis Fluid Bolus Normal Saline  Bolus Amount (mL) 250 mL  Intra-Hemodialysis Comments Tx completed

## 2017-10-02 NOTE — Progress Notes (Signed)
Brent Glover at [place] NAME: Brent Glover    MR#:  [id number]  DATE OF BIRTH:  1935-04-09  SUBJECTIVE:   Came in with increasing weakness and found to have low hgb Pt is feeling better.  Denies any abdominal pain.  Had a bowel movement but forgot to collect the stool sample REVIEW OF SYSTEMS:   Review of Systems  Constitutional: Negative for chills, fever and weight loss.  HENT: Negative for ear discharge, ear pain and nosebleeds.   Eyes: Negative for blurred vision, pain and discharge.  Respiratory: Negative for sputum production, shortness of breath, wheezing and stridor.   Cardiovascular: Negative for chest pain, palpitations, orthopnea and PND.  Gastrointestinal: Negative for abdominal pain, diarrhea, nausea and vomiting.  Genitourinary: Negative for frequency and urgency.  Musculoskeletal: Negative for back pain and joint pain.  Neurological: Positive for weakness. Negative for sensory change, speech change and focal weakness.  Psychiatric/Behavioral: Negative for depression and hallucinations. The patient is not nervous/anxious.    Tolerating Diet:yes Tolerating PT: pending  DRUG ALLERGIES:   Allergies  Allergen Reactions  . Montelukast Shortness Of Breath  . Trazodone Other (See Comments)    "it hypes me up instead of putting me to sleep"  . Hydrocodone Nausea And Vomiting    VITALS:  Blood pressure (!) 159/64, pulse 66, temperature 97.6 F (36.4 C), temperature source Oral, resp. rate 19, height 5\' 7"  (1.702 m), weight 75.4 kg, SpO2 93 %.  PHYSICAL EXAMINATION:   Physical Exam  GENERAL:  82 y.o.-year-old patient lying in the bed with no acute distress. Pallor+ EYES: Pupils equal, round, reactive to light and accommodation. No scleral icterus. Extraocular muscles intact.  HEENT: Head atraumatic, normocephalic. Oropharynx and nasopharynx clear.  NECK:  Supple, no jugular venous distention. No thyroid enlargement,  no tenderness.  LUNGS: Normal breath sounds bilaterally, no wheezing, rales, rhonchi. No use of accessory muscles of respiration.  CARDIOVASCULAR: S1, S2 normal. No murmurs, rubs, or gallops.  ABDOMEN: Soft, nontender, nondistended. Bowel sounds present. No organomegaly or mass.  EXTREMITIES: No cyanosis, clubbing or edema b/l.    NEUROLOGIC: Cranial nerves II through XII are intact. No focal Motor or sensory deficits b/l.   PSYCHIATRIC:  patient is alert and oriented x 3.  SKIN: No obvious rash, lesion, or ulcer.   LABORATORY PANEL:  CBC Recent Labs  Lab 10/02/17 0450  WBC 5.8  HGB 8.3*  HCT 23.6*  PLT 106*    Chemistries  Recent Labs  Lab 09/29/17 1120  10/01/17 2119 10/02/17 0450  NA 138   < > 135  --   K 5.1   < > 5.6* 5.0  CL 99   < > 101  --   CO2 29   < > 27  --   GLUCOSE 158*   < > 99  --   BUN 98*   < > 69*  --   CREATININE 5.25*   < > 5.52*  --   CALCIUM 9.4   < > 7.8*  --   AST 17  --   --   --   ALT 11  --   --   --   ALKPHOS 64  --   --   --   BILITOT 0.2*  --   --   --    < > = values in this interval not displayed.   Cardiac Enzymes Recent Labs  Lab 09/29/17 1120  TROPONINI 0.05*  RADIOLOGY:  No results found. ASSESSMENT AND PLAN:   Taijuan Serviss  is a 82 y.o. male with a known history of BPH, chronic diastolic CHF, chronic kidney disease stage III, COPD, diabetes type 2, end-stage renal disease on hemodialysis who is presenting with complaint of generalized weakness and fall times twice.  1. Generalized weakness likely due to symptomatic anemia resulting in falls w/o trauma -I have consented patient to be transfused 1 unit of packed RBCs he is agreeable to transfusion risk and benefits explained - remains in SR -hgb 7.3--6.0--5.7  , 1 unit BT --8.3 -GI consult placed , if patient has GI bleed or stool for occult blood is positive Dr. Allen Norris is considering capsule study.  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 essential hypertension continue Coreg as well as amlodipine  3.  History of congestive heart failure continue spironolactone and Coreg  4. Diabetes type 2 Place patient on sliding scale insulin  5.  Hyperkalemia improved potassium down to 5.0 Patient had hemodialysis today.  Will repeat labs    6.GERD continue PPIs  6.  SCDs for DVT prophylaxis  7. ESRD on HD just started 1 month ago, patient had hemodialysis today As per my discussion with nephrology patient is getting Procrit for anemia of chronic kidney disease  Case discussed with Care Management/Social Worker. Management plans discussed with the patient, family and they are in agreement.  CODE STATUS: FUll  DVT Prophylaxis:SCD  TOTAL TIME TAKING CARE OF THIS PATIENT: 33 minutes.  >50% time spent on counselling and coordination of care  POSSIBLE D/C IN *1-2 DAYS, DEPENDING ON CLINICAL CONDITION.  Note: This dictation was prepared with Dragon dictation along with smaller phrase technology. Any transcriptional errors that result from this process are unintentional.  Brent Glover M.D on 10/02/2017 at 12:59 PM  Between 7am to 6pm - Pager - [phone]  After 6pm go to [email or web address] - password [email or web address] [facility]  Office  [phone]  CC: Primary care physician; Christen Bame, DOPatient ID: Brent Glover, male   DOB: 08-13-1935, 82 y.o.   MRN: [id number]

## 2017-10-02 NOTE — Evaluation (Signed)
Physical Therapy Evaluation Patient Details Name: Brent Glover MRN: [id number] DOB: 05/20/1935 Today's Date: 10/02/2017   History of Present Illness   82 y.o. male with a known history of BPH, chronic diastolic CHF, chronic kidney disease stage III, COPD, diabetes type 2, end-stage renal disease on hemodialysis who is presenting with complaint of generalized weakness and fall.  HGB has been low, post transfusion he is 8.3 at time of eval.  Clinical Impression  Pt did well with PT exam, he showed good confidence with mobility, transfers, ambulation, balance and stair negotiation.  He had no issues with safety and reports being at/near baseline.  Once medically cleared pt should be able to return home w/o PT f/u, will sign off.     Follow Up Recommendations No PT follow up    Equipment Recommendations  None recommended by PT    Recommendations for Other Services       Precautions / Restrictions Precautions Precautions: Fall Restrictions Weight Bearing Restrictions: No      Mobility  Bed Mobility Overal bed mobility: Independent                Transfers Overall transfer level: Independent Equipment used: None             General transfer comment: Pt confident and safe with transfer to/from standing  Ambulation/Gait Ambulation/Gait assistance: Independent Gait Distance (Feet): 400 Feet Assistive device: None       General Gait Details: Pt ambulated with good speed, confidence and safety, no issues and reports being near baseline  Stairs Stairs: Yes Stairs assistance: Independent Stair Management: One rail Left Number of Stairs: 6 General stair comments: No issues with stair negotiation  Wheelchair Mobility    Modified Rankin (Stroke Patients Only)       Balance Overall balance assessment: Independent                                           Pertinent Vitals/Pain Pain Assessment: No/denies pain    Home Living  Family/patient expects to be discharged to:: Private residence Living Arrangements: Spouse/significant other Available Help at Discharge: Family   Home Access: Stairs to enter Entrance Stairs-Rails: Can reach both Entrance Stairs-Number of Steps: 4   Home Equipment: Environmental consultant - 2 wheels;Cane - single point;Wheelchair - manual      Prior Function Level of Independence: Independent         Comments: Pt able to drive, be active, etc     Hand Dominance        Extremity/Trunk Assessment   Upper Extremity Assessment Upper Extremity Assessment: Overall WFL for tasks assessed    Lower Extremity Assessment Lower Extremity Assessment: Overall WFL for tasks assessed       Communication   Communication: No difficulties  Cognition Arousal/Alertness: Awake/alert Behavior During Therapy: WFL for tasks assessed/performed Overall Cognitive Status: Within Functional Limits for tasks assessed                                        General Comments      Exercises     Assessment/Plan    PT Assessment Patent does not need any further PT services  PT Problem List         PT Treatment Interventions      PT Goals (  Current goals can be found in the Care Plan section)  Acute Rehab PT Goals Patient Stated Goal: go home PT Goal Formulation: All assessment and education complete, DC therapy    Frequency     Barriers to discharge        Co-evaluation               AM-PAC PT "6 Clicks" Daily Activity  Outcome Measure Difficulty turning over in bed (including adjusting bedclothes, sheets and blankets)?: None Difficulty moving from lying on back to sitting on the side of the bed? : None Difficulty sitting down on and standing up from a chair with arms (e.g., wheelchair, bedside commode, etc,.)?: None Help needed moving to and from a bed to chair (including a wheelchair)?: None Help needed walking in hospital room?: None Help needed climbing 3-5 steps  with a railing? : None 6 Click Score: 24    End of Session Equipment Utilized During Treatment: Gait belt Activity Tolerance: Patient tolerated treatment well Patient left: with bed alarm set;with call bell/phone within reach   PT Visit Diagnosis: Muscle weakness (generalized) (M62.81)    Time: 8115-7262 PT Time Calculation (min) (ACUTE ONLY): 14 min   Charges:   PT Evaluation $PT Eval Low Complexity: 1 Low          Kreg Shropshire, DPT 10/02/2017, 4:34 PM

## 2017-10-02 NOTE — Progress Notes (Signed)
Report given to HD, midodrine given.  Transfer to HD.

## 2017-10-02 NOTE — Progress Notes (Signed)
Pre HD assessment    10/02/17 0804  Vital Signs  Temp 98.1 F (36.7 C)  Temp Source Oral  Pulse Rate 63  Pulse Rate Source Monitor  Resp 17  BP (!) 153/75  BP Location Right Arm  BP Method Automatic  Patient Position (if appropriate) Lying  Oxygen Therapy  SpO2 98 %  O2 Device Room Air  Pain Assessment  Pain Scale 0-10  Pain Score 0  Dialysis Weight  Weight 75.9 kg  Type of Weight Pre-Dialysis  Time-Out for Hemodialysis  What Procedure? HD  Pt Identifiers(min of two) First/Last Name;MRN/Account#  Correct Site? Yes  Correct Side? Yes  Correct Procedure? Yes  Consents Verified? Yes  Rad Studies Available? N/A  Safety Precautions Reviewed? Yes  Engineer, civil (consulting) Number  (1A)  Station Number 1  UF/Alarm Test Passed  Conductivity: Meter 13.6  Conductivity: Machine  13.9  pH 7.6  Reverse Osmosis main  Normal Saline Lot Number [id number]  Dialyzer Lot Number [id number]  Disposable Set Lot Number [id number]  Machine Temperature 98.6 F (37 C)  Musician and Audible Yes  Blood Lines Intact and Secured Yes  Pre Treatment Patient Checks  Vascular access used during treatment Catheter  Hepatitis B Surface Antigen Results Negative  Date Hepatitis B Surface Antigen Drawn 09/15/17  Hepatitis B Surface Antibody  (<10)  Date Hepatitis B Surface Antibody Drawn 09/15/17  Hemodialysis Consent Verified Yes  Hemodialysis Standing Orders Initiated Yes  ECG (Telemetry) Monitor On Yes  Prime Ordered Normal Saline  Length of  DialysisTreatment -hour(s) 3 Hour(s)  Dialyzer Elisio 17H NR  Dialysate 2K, 2.5 Ca  Dialysis Anticoagulant None  Dialysate Flow Ordered 600  Blood Flow Rate Ordered 400 mL/min  Ultrafiltration Goal 0.5 Liters  Dialysis Blood Pressure Support Ordered Normal Saline  Education / Care Plan  Dialysis Education Provided Yes  Documented Education in Care Plan Yes

## 2017-10-02 NOTE — Progress Notes (Signed)
Post HD assessment    10/02/17 1125  Neurological  Level of Consciousness Alert  Orientation Level Oriented X4  Respiratory  Respiratory Pattern Regular;Unlabored  Chest Assessment Chest expansion symmetrical  Cardiac  ECG Monitor Yes  Cardiac Rhythm NSR;SB  Vascular  R Radial Pulse +2  L Radial Pulse +2  Edema Generalized  Integumentary  Integumentary (WDL) X  Skin Color Pale  Musculoskeletal  Musculoskeletal (WDL) X  Generalized Weakness Yes  Assistive Device None  GU Assessment  Genitourinary (WDL) X  Genitourinary Symptoms  (HD)  Psychosocial  Psychosocial (WDL) WDL

## 2017-10-02 NOTE — Progress Notes (Signed)
Post HD assessment. Pt tolerated tx well without c/o or complication. NeUF 528, goal met.    10/02/17 1126  Vital Signs  Temp 97.6 F (36.4 C)  Temp Source Oral  Pulse Rate 66  Pulse Rate Source Monitor  Resp 19  BP (!) 159/64  BP Location Right Arm  BP Method Automatic  Patient Position (if appropriate) Lying  Oxygen Therapy  SpO2 94 %  O2 Device Room Air  Dialysis Weight  Weight 75.4 kg  Type of Weight Post-Dialysis  Post-Hemodialysis Assessment  Rinseback Volume (mL) 250 mL  KECN 67.8 V  Dialyzer Clearance Lightly streaked  Duration of HD Treatment -hour(s) 3 hour(s)  Hemodialysis Intake (mL) 500 mL  UF Total -Machine (mL) 1028 mL  Net UF (mL) 528 mL  Tolerated HD Treatment Yes  Education / Care Plan  Dialysis Education Provided Yes  Documented Education in Care Plan Yes

## 2017-10-02 NOTE — Plan of Care (Signed)

## 2017-10-02 NOTE — Plan of Care (Signed)
Patient resting inhe room, denies any pain, vss,  HD site dry and intact no bleeding noted    Problem: Pain Managment: Goal: General experience of comfort will improve Outcome: Progressing   Problem: Safety: Goal: Ability to remain free from injury will improve Outcome: Progressing

## 2017-10-02 NOTE — Progress Notes (Signed)
Pre HD assessment    10/02/17 0805  Neurological  Level of Consciousness Alert  Orientation Level Oriented X4  Respiratory  Respiratory Pattern Regular;Unlabored  Chest Assessment Chest expansion symmetrical  Cardiac  ECG Monitor Yes  Cardiac Rhythm NSR  Vascular  R Radial Pulse +2  L Radial Pulse +2  Edema Generalized  Integumentary  Integumentary (WDL) X  Skin Color Pale  Musculoskeletal  Musculoskeletal (WDL) X  Generalized Weakness Yes  Assistive Device None  GU Assessment  Genitourinary (WDL) X  Genitourinary Symptoms  (HD)  Psychosocial  Psychosocial (WDL) WDL

## 2017-10-02 NOTE — Progress Notes (Signed)
HD tx start    10/02/17 0810  Vital Signs  Pulse Rate 61  Pulse Rate Source Monitor  Resp 20  BP (!) 170/64  BP Location Right Arm  BP Method Automatic  Patient Position (if appropriate) Lying  Oxygen Therapy  SpO2 96 %  O2 Device Room Air  During Hemodialysis Assessment  Blood Flow Rate (mL/min) 400 mL/min  Arterial Pressure (mmHg) -140 mmHg  Venous Pressure (mmHg) 130 mmHg  Transmembrane Pressure (mmHg) 60 mmHg  Ultrafiltration Rate (mL/min) 330 mL/min  Dialysate Flow Rate (mL/min) 600 ml/min  Conductivity: Machine  13.9  HD Safety Checks Performed Yes  Dialysis Fluid Bolus Normal Saline  Bolus Amount (mL) 250 mL  Intra-Hemodialysis Comments Tx initiated

## 2017-10-02 NOTE — Progress Notes (Signed)
[facility]  ROUNDING NOTE   Subjective:   Seen and examined on hemodialysis. Tolerating treatment well. Has some complaints of nausea. UF goal of 0.5 liters. Midodrine given before treatment.  Potassium 5.6 - placed on 2K bath.   2 units PRBC transfusion yesterday. Patient states this makes him feel better with more energy.    Objective:  Vital signs in last 24 hours:  Temp:  [97.7 F (36.5 C)-98.8 F (37.1 C)] 98.1 F (36.7 C) (08/27 0804) Pulse Rate:  [59-65] 61 (08/27 0810) Resp:  [17-20] 20 (08/27 0810) BP: (116-170)/(55-75) 170/64 (08/27 0810) SpO2:  [92 %-98 %] 96 % (08/27 0810) Weight:  [75 kg-75.9 kg] 75.9 kg (08/27 0804)  Weight change: 0.544 kg Filed Weights   10/01/17 0511 10/02/17 0336 10/02/17 0804  Weight: 74.5 kg 75 kg 75.9 kg    Intake/Output: I/O last 3 completed shifts: In: 1051.6 [P.O.:360; Blood:691.6] Out: 1290 [Urine:1290]   Intake/Output this shift:  Total I/O In: -  Out: 400 [Urine:400]  Physical Exam: General: No acute distress  Head: Left parotid mass - soft, nontender. Moist oral mucosal membranes  Eyes: Anicteric  Neck: Supple, trachea midline  Lungs:  Clear to auscultation, normal effort  Heart: S1S2 no rubs  Abdomen:  Soft, nontender, bowel sounds present  Extremities: No peripheral edema.  Neurologic: Awake, alert, following commands  Skin: No lesions  Access: IJ permcath in place    Basic Metabolic Panel: Recent Labs  Lab 09/29/17 1120 09/29/17 1854 09/30/17 0434 10/01/17 2119  NA 138  --  139 135  K 5.1  --  4.2 5.6*  CL 99  --  98 101  CO2 29  --  33* 27  GLUCOSE 158*  --  96 99  BUN 98*  --  45* 69*  CREATININE 5.25*  --  3.34* 5.52*  CALCIUM 9.4  --  8.3* 7.8*  PHOS  --  1.6*  --  3.9    Liver Function Tests: Recent Labs  Lab 09/29/17 1120 10/01/17 2119  AST 17  --   ALT 11  --   ALKPHOS 64  --   BILITOT 0.2*  --   PROT 6.1*  --   ALBUMIN 3.0* 2.9*   Recent Labs  Lab 09/29/17 1120   LIPASE 39   No results for input(s): AMMONIA in the last 168 hours.  CBC: Recent Labs  Lab 09/29/17 1120 09/30/17 0434 10/01/17 0439 10/01/17 0947 10/01/17 2119 10/02/17 0450  WBC 8.9 7.2  --   --  6.5 5.8  HGB 7.3* 6.0* 6.2* 5.7* 8.3* 8.3*  HCT 21.5* 17.3*  --  16.8* 23.7* 23.6*  MCV 97.1 96.3  --   --  91.9 92.0  PLT 110* 95*  --   --  112* 106*    Cardiac Enzymes: Recent Labs  Lab 09/29/17 1120  TROPONINI 0.05*    BNP: Invalid input(s): POCBNP  CBG: Recent Labs  Lab 10/01/17 0848 10/01/17 1115 10/01/17 1636 10/01/17 2035 10/02/17 0735  GLUCAP 110* 133* 100* 101* 26    Microbiology: No results found for this or any previous visit.  Coagulation Studies: No results for input(s): LABPROT, INR in the last 72 hours.  Urinalysis: Recent Labs    10/01/17 0710  COLORURINE STRAW*  LABSPEC 1.006  PHURINE 8.0  GLUCOSEU NEGATIVE  HGBUR NEGATIVE  BILIRUBINUR NEGATIVE  KETONESUR NEGATIVE  PROTEINUR 100*  NITRITE NEGATIVE  LEUKOCYTESUR NEGATIVE      Imaging: No results found.  Medications:   . sodium chloride     . sodium chloride   Intravenous Once  . sodium chloride   Intravenous Once  . sodium chloride   Intravenous Once  . atorvastatin  80 mg Oral Daily  . carvedilol  6.25 mg Oral BID WC  . Chlorhexidine Gluconate Cloth  6 each Topical Q0600  . escitalopram  20 mg Oral Daily  . ferrous sulfate  325 mg Oral Q breakfast  . insulin aspart  0-9 Units Subcutaneous TID WC  . magnesium oxide  400 mg Oral Daily  . Melatonin  5 mg Oral QHS  . midodrine  10 mg Oral Q T,Th,Sa-HD  . midodrine  5 mg Oral Q T,Th,Sa-HD  . pantoprazole  40 mg Oral Daily  . rOPINIRole  0.5 mg Oral QHS  . sodium chloride flush  3 mL Intravenous Q12H  . spironolactone  12.5 mg Oral QHS  . tamsulosin  0.4 mg Oral Daily  . [START ON 10/05/2017] Vitamin D (Ergocalciferol)  50,000 Units Oral Q Fri   sodium chloride, acetaminophen **OR** acetaminophen, fluticasone,  ondansetron **OR** ondansetron (ZOFRAN) IV, sodium chloride flush  Assessment/ Plan:  [name] is a 82 y.o. white male with end stage renal disease on hemodialysis, peptic ulcer disease, congestive heart failure, COPD, hypertension, diabetes mellitus type II insulin dependent, left warthin tumor, coronary artery disease status post CABG  [facility] [place]  1.  ESRD on HD TTS.  Seen and examined on hemodialysis. 2K bath Tolerating hemodialysis treatment well.  - Hold spironolactone due to hyperkalemia.   2.  Anemia of chronic kidney disease with acute anemia blood loss. Patient states he was been having black stools.  Status post PRBC transfusions on 8/25 and then 2 units on 8/26 - Appreciate GI input - EPO with HD treatment - Iron supplements PO  3.  Secondary hyperparathyroidism: corrected calcium at goal. Phosphorus at goal.  - Not currently on binders.   4.  Hypertension: with midodrine for hemodialysis related hypotension BP 160/73 - Holding spironolactone and amlodipine - Continue carvedilol and tamulosin.     LOS: 2 Kimaya Whitlatch 8/27/20198:18 AM

## 2017-10-03 LAB — BASIC METABOLIC PANEL
Anion gap: 10 (ref 5–15)
BUN: 39 mg/dL — AB (ref 8–23)
CO2: 29 mmol/L (ref 22–32)
CREATININE: 3.95 mg/dL — AB (ref 0.61–1.24)
Calcium: 8 mg/dL — ABNORMAL LOW (ref 8.9–10.3)
Chloride: 102 mmol/L (ref 98–111)
GFR calc Af Amer: 15 mL/min — ABNORMAL LOW (ref >60)
GFR calc non Af Amer: 13 mL/min — ABNORMAL LOW (ref >60)
Glucose, Bld: 104 mg/dL — ABNORMAL HIGH (ref 70–99)
POTASSIUM: 4.5 mmol/L (ref 3.5–5.1)
Sodium: 141 mmol/L (ref 135–145)

## 2017-10-03 LAB — GLUCOSE, CAPILLARY
GLUCOSE-CAPILLARY: 105 mg/dL — AB (ref 70–99)
Glucose-Capillary: 106 mg/dL — ABNORMAL HIGH (ref 70–99)

## 2017-10-03 LAB — CBC
HCT: 25.2 % — ABNORMAL LOW (ref 40.0–52.0)
Hemoglobin: 8.8 g/dL — ABNORMAL LOW (ref 13.0–18.0)
MCH: 32.5 pg (ref 26.0–34.0)
MCHC: 34.9 g/dL (ref 32.0–36.0)
MCV: 93.3 fL (ref 80.0–100.0)
Platelets: 117 10*3/uL — ABNORMAL LOW (ref 150–440)
RBC: 2.71 MIL/uL — ABNORMAL LOW (ref 4.40–5.90)
RDW: 16.4 % — AB (ref 11.5–14.5)
WBC: 5.7 10*3/uL (ref 3.8–10.6)

## 2017-10-03 MED ORDER — VITAMIN D (ERGOCALCIFEROL) 1.25 MG (50000 UNIT) PO CAPS: 50000.0000 [IU] | ORAL_CAPSULE | ORAL | Status: DC

## 2017-10-03 NOTE — Care Management Important Message (Signed)
Copy of signed IM left with patient in room.  

## 2017-10-03 NOTE — Discharge Summary (Addendum)
[facility] NAME: Lorne Winkels    MR#:  [id number]  DATE OF BIRTH:  04/12/1935  DATE OF ADMISSION:  09/29/2017 ADMITTING PHYSICIAN: Dustin Flock, MD  DATE OF DISCHARGE: 10/03/17   PRIMARY CARE PHYSICIAN: Christen Bame, DO    ADMISSION DIAGNOSIS:  ESRD (end stage renal disease) ([place]) [N18.6] Syncope, unspecified syncope type [R55] Anemia, unspecified type [D64.9] Stage 5 chronic kidney disease on chronic dialysis ([place]) [N18.6, Z99.2]  DISCHARGE DIAGNOSIS:  Active Problems:   Syncope and collapse   Acute on chronic anemia   Anemia   SECONDARY DIAGNOSIS:   Past Medical History:  Diagnosis Date  . BPH (benign prostatic hyperplasia)   . Chronic diastolic heart failure ([place])   . COPD (chronic obstructive pulmonary disease) ([place])   . Diabetes mellitus without complication ([place])   . ESRD (end stage renal disease) ([place])    stage III  . Hyperlipidemia   . Hypertension     HOSPITAL COURSE:  HISTORY OF PRESENT ILLNESS: Kesley Gaffey  is a 82 y.o. male with a known history of BPH, chronic diastolic CHF, chronic kidney disease stage III, COPD, diabetes type 2, end-stage renal disease on hemodialysis who is presenting with complaint of generalized weakness and fall times twice.  He reports that this happened since yesterday.  Patient states that he was supposed to have dialysis today and has not had a hemodialysis.  Patient in the ER was noted to have worsening anemia.  He does have a history of low blood pressure with hemodialysis.  Patient has required transfusions in the past.  He denies any chest pain or palpitations.   .Generalized weakness likely due to symptomatic anemia resulting in falls w/o trauma -I have consented patient to be transfused 1 unit of packed RBCs he is agreeable to transfusion risk and benefits explained - remains in SR -hgb 7.3--6.0--5.7  , 1 unit BT --8.3--8.8 -GI consult placed , if  patient has GI bleed or stool for occult blood is positive Dr. Allen Norris is considering capsule study.  Stool for occult blood is pending patient is having bowel movements but not providing the sample.  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  Hyperkalemia improved potassium down to 4.5 Patient had hemodialysis today.  d/c spironolactone  6.GERD continue PPIs  6.SCDs for DVT prophylaxis  7. ESRD on HD just started 1 month ago, patient had hemodialysis 10/02/17  Seen by physical therapy no PT needs identified DISCHARGE CONDITIONS:  STABLE   CONSULTS OBTAINED:  Treatment Team:  Anthonette Legato, MD Lucilla Lame, MD   PROCEDURES  NONE   DRUG ALLERGIES:   Allergies  Allergen Reactions  . Montelukast Shortness Of Breath  . Trazodone Other (See Comments)    "it hypes me up instead  of putting me to sleep"  . Hydrocodone Nausea And Vomiting    DISCHARGE MEDICATIONS:   Allergies as of 10/03/2017      Reactions   Montelukast Shortness Of Breath   Trazodone Other (See Comments)   "it hypes me up instead of putting me to sleep"   Hydrocodone Nausea And Vomiting      Medication List    STOP taking these medications   lisinopril 10 MG tablet Commonly  known as:  PRINIVIL,ZESTRIL   spironolactone 25 MG tablet Commonly known as:  ALDACTONE     TAKE these medications   acetaminophen 500 MG tablet Commonly known as:  TYLENOL Take 500 mg by mouth every 6 (six) hours as needed for mild pain.   amLODipine 10 MG tablet Commonly known as:  NORVASC Take 0.5 tablets (5 mg total) by mouth daily.   aspirin 81 MG tablet Take 81 mg by mouth daily.   atorvastatin 80 MG tablet Commonly known as:  LIPITOR Take 80 mg by mouth daily.   carvedilol 6.25 MG tablet Commonly known as:  COREG Take 6.25 mg by mouth 2 (two) times daily with a meal. What changed:  Another medication with the same name was removed. Continue taking this medication, and follow the directions you see here.   ergocalciferol 50000 units capsule Commonly known as:  VITAMIN D2 Take 50,000 Units by mouth every Friday. What changed:  Another medication with the same name was added. Make sure you understand how and when to take each.   Vitamin D (Ergocalciferol) 50000 units Caps capsule Commonly known as:  DRISDOL Take 1 capsule (50,000 Units total) by mouth every Friday. Start taking on:  10/05/2017 What changed:  You were already taking a medication with the same name, and this prescription was added. Make sure you understand how and when to take each.   ferrous sulfate 325 (65 FE) MG tablet Take 325 mg by mouth daily with breakfast.   fluticasone 50 MCG/ACT nasal spray Commonly known as:  FLONASE Place 2 sprays into both nostrils daily as needed for allergies.   furosemide 20 MG tablet Commonly known as:  LASIX Take 1 tablet (20 mg total) by mouth daily.   LEXAPRO 10 MG tablet Generic drug:  escitalopram Take 20 mg by mouth daily.   magnesium oxide 400 MG tablet Commonly known as:  MAG-OX Take 400 mg by mouth daily.   Melatonin 5 MG Tabs Take 5 mg by mouth at bedtime.   midodrine 5 MG tablet Commonly known as:  PROAMATINE Take 5-10 mg by mouth Every  Tuesday,Thursday,and Saturday with dialysis. Give 10 mg by mouth 30 minutes before dialysis and 5 mg by mouth half way through dialysis.   pantoprazole 40 MG tablet Commonly known as:  PROTONIX Take 40 mg by mouth daily.   rOPINIRole 0.25 MG tablet Commonly known as:  REQUIP Take 0.5 mg by mouth at bedtime. Reported on 02/10/2015   tamsulosin 0.4 MG Caps capsule Commonly known as:  FLOMAX Take 0.4 mg by mouth daily.        DISCHARGE INSTRUCTIONS:   Follow-up with primary care physician in 3 days Follow-up with gastroenterology Dr. Allen Norris in a week Follow-up with nephrology -[facility] to continue hemodialysis  DIET:  Renal diet  DISCHARGE CONDITION:  Stable  ACTIVITY:  Activity as tolerated  OXYGEN:  Home Oxygen: No.   Oxygen Delivery: room air  DISCHARGE LOCATION:  home   If you experience worsening of your admission symptoms, develop shortness  of breath, life threatening emergency, suicidal or homicidal thoughts you must seek medical attention immediately by calling 911 or calling your MD immediately  if symptoms less severe.  You Must read complete instructions/literature along with all the possible adverse reactions/side effects for all the Medicines you take and that have been prescribed to you. Take any new Medicines after you have completely understood and accpet all the possible adverse reactions/side effects.   Please note  You were cared for by a hospitalist during your hospital stay. If you have any questions about your discharge medications or the care you received while you were in the hospital after you are discharged, you can call the unit and asked to speak with the hospitalist on call if the hospitalist that took care of you is not available. Once you are discharged, your primary care physician will handle any further medical issues. Please note that NO REFILLS for any discharge medications will be authorized once you are discharged, as it is imperative that you  return to your primary care physician (or establish a relationship with a primary care physician if you do not have one) for your aftercare needs so that they can reassess your need for medications and monitor your lab values.     Today  Chief Complaint  Patient presents with  . Loss of Consciousness   Patient is feeling fine no abdominal pain no other episodes of bleeding.  No symptoms.  Hemodynamically stable.  Okay to discharge patient from GI standpoint for outpatient follow-up and further work-up  ROS:  CONSTITUTIONAL: Denies fevers, chills. Denies any fatigue, weakness.  EYES: Denies blurry vision, double vision, eye pain. EARS, NOSE, THROAT: Denies tinnitus, ear pain, hearing loss. RESPIRATORY: Denies cough, wheeze, shortness of breath.  CARDIOVASCULAR: Denies chest pain, palpitations, edema.  GASTROINTESTINAL: Denies nausea, vomiting, diarrhea, abdominal pain. Denies bright red blood per rectum. GENITOURINARY: Denies dysuria, hematuria. ENDOCRINE: Denies nocturia or thyroid problems. HEMATOLOGIC AND LYMPHATIC: Denies easy bruising or bleeding. SKIN: Denies rash or lesion. MUSCULOSKELETAL: Denies pain in neck, back, shoulder, knees, hips or arthritic symptoms.  NEUROLOGIC: Denies paralysis, paresthesias.  PSYCHIATRIC: Denies anxiety or depressive symptoms.   VITAL SIGNS:  Blood pressure 119/67, pulse 65, temperature 97.8 F (36.6 C), temperature source Oral, resp. rate 19, height 5\' 7"  (1.702 m), weight 73.9 kg, SpO2 94 %.  I/O:    Intake/Output Summary (Last 24 hours) at 10/03/2017 1409 Last data filed at 10/03/2017 1135 Gross per 24 hour  Intake 240 ml  Output 850 ml  Net -610 ml    PHYSICAL EXAMINATION:  GENERAL:  82 y.o.-year-old patient lying in the bed with no acute distress.  EYES: Pupils equal, round, reactive to light and accommodation. No scleral icterus. Extraocular muscles intact.  HEENT: Head atraumatic, normocephalic. Oropharynx and nasopharynx clear.   NECK:  Supple, no jugular venous distention. No thyroid enlargement, no tenderness.  LUNGS: Normal breath sounds bilaterally, no wheezing, rales,rhonchi or crepitation. No use of accessory muscles of respiration.  CARDIOVASCULAR: S1, S2 normal. No murmurs, rubs, or gallops.  ABDOMEN: Soft, non-tender, non-distended. Bowel sounds present. No organomegaly or mass.  EXTREMITIES: No pedal edema, cyanosis, or clubbing.  NEUROLOGIC: Cranial nerves II through XII are intact. Muscle strength 5/5 in all extremities. Sensation intact. Gait not checked.  PSYCHIATRIC: The patient is alert and oriented x 3.  SKIN: No obvious rash, lesion, or ulcer.   DATA REVIEW:   CBC Recent Labs  Lab 10/03/17 0520  WBC 5.7  HGB 8.8*  HCT  25.2*  PLT 117*    Chemistries  Recent Labs  Lab 09/29/17 1120  10/03/17 0520  NA 138   < > 141  K 5.1   < > 4.5  CL 99   < > 102  CO2 29   < > 29  GLUCOSE 158*   < > 104*  BUN 98*   < > 39*  CREATININE 5.25*   < > 3.95*  CALCIUM 9.4   < > 8.0*  AST 17  --   --   ALT 11  --   --   ALKPHOS 64  --   --   BILITOT 0.2*  --   --    < > = values in this interval not displayed.    Cardiac Enzymes Recent Labs  Lab 09/29/17 1120  TROPONINI 0.05*    Microbiology Results  No results found for this or any previous visit.  RADIOLOGY:  No results found.  EKG:   Orders placed or performed during the hospital encounter of 09/29/17  . ED EKG  . ED EKG  . EKG 12-Lead  . EKG 12-Lead      Management plans discussed with the patient, family and they are in agreement.  CODE STATUS:     Code Status Orders  (From admission, onward)         Start     Ordered   09/29/17 1526  Full code  Continuous     09/29/17 1525        Code Status History    This patient has a current code status but no historical code status.      TOTAL TIME TAKING CARE OF THIS PATIENT:  45  minutes.   Note: This dictation was prepared with Dragon dictation along with smaller  phrase technology. Any transcriptional errors that result from this process are unintentional.   @MEC @  on 10/03/2017 at 2:09 PM  Between 7am to 6pm - Pager - [phone]  After 6pm go to [email or web address] - password [email or web address] [facility]  Office  [phone]  CC: Primary care physician; Christen Bame, DO

## 2017-10-03 NOTE — Discharge Instructions (Signed)
Follow-up with primary care physician in 3 days Follow-up with gastroenterology Dr. Allen Norris in a week Follow-up with nephrology- [facility] to continue hemodialysis

## 2017-10-03 NOTE — Progress Notes (Signed)
[facility]  ROUNDING NOTE   Subjective:   Hemodialysis yesterday. Tolerated treatment well.  UF 1 liter.   Objective:  Vital signs in last 24 hours:  Temp:  [97.8 F (36.6 C)-99.1 F (37.3 C)] 97.8 F (36.6 C) (08/28 0606) Pulse Rate:  [60-67] 65 (08/28 0857) Resp:  [18-19] 19 (08/28 0857) BP: (119-164)/(62-79) 119/67 (08/28 0857) SpO2:  [90 %-97 %] 94 % (08/28 0857) Weight:  [73.9 kg] 73.9 kg (08/28 0606)  Weight change: 0.875 kg Filed Weights   10/02/17 0804 10/02/17 1126 10/03/17 0606  Weight: 75.9 kg 75.4 kg 73.9 kg    Intake/Output: I/O last 3 completed shifts: In: 821.6 [P.O.:480; Blood:341.6] Out: 2128 [Urine:1600; Other:528]   Intake/Output this shift:  Total I/O In: -  Out: 250 [Urine:250]  Physical Exam: General: Laying in bed.   Head: Left parotid mass - soft, nontender. Moist oral mucosal membranes  Eyes: Anicteric  Neck: Supple, trachea midline  Lungs:  Clear to auscultation, normal effort  Heart: S1S2 no rubs  Abdomen:  Soft, nontender, bowel sounds present  Extremities: No peripheral edema.  Neurologic: Awake, alert, following commands  Skin: No lesions  Access: IJ permcath in place    Basic Metabolic Panel: Recent Labs  Lab 09/29/17 1120 09/29/17 1854 09/30/17 0434 10/01/17 2119 10/02/17 0450 10/03/17 0520  NA 138  --  139 135  --  141  K 5.1  --  4.2 5.6* 5.0 4.5  CL 99  --  98 101  --  102  CO2 29  --  33* 27  --  29  GLUCOSE 158*  --  96 99  --  104*  BUN 98*  --  45* 69*  --  39*  CREATININE 5.25*  --  3.34* 5.52*  --  3.95*  CALCIUM 9.4  --  8.3* 7.8*  --  8.0*  PHOS  --  1.6*  --  3.9  --   --     Liver Function Tests: Recent Labs  Lab 09/29/17 1120 10/01/17 2119  AST 17  --   ALT 11  --   ALKPHOS 64  --   BILITOT 0.2*  --   PROT 6.1*  --   ALBUMIN 3.0* 2.9*   Recent Labs  Lab 09/29/17 1120  LIPASE 39   No results for input(s): AMMONIA in the last 168 hours.  CBC: Recent Labs  Lab  09/29/17 1120 09/30/17 0434 10/01/17 0439 10/01/17 0947 10/01/17 2119 10/02/17 0450 10/03/17 0520  WBC 8.9 7.2  --   --  6.5 5.8 5.7  HGB 7.3* 6.0* 6.2* 5.7* 8.3* 8.3* 8.8*  HCT 21.5* 17.3*  --  16.8* 23.7* 23.6* 25.2*  MCV 97.1 96.3  --   --  91.9 92.0 93.3  PLT 110* 95*  --   --  112* 106* 117*    Cardiac Enzymes: Recent Labs  Lab 09/29/17 1120  TROPONINI 0.05*    BNP: Invalid input(s): POCBNP  CBG: Recent Labs  Lab 10/02/17 1206 10/02/17 1659 10/02/17 2051 10/03/17 0741 10/03/17 1141  GLUCAP 82 121* 105* 105* 106*    Microbiology: No results found for this or any previous visit.  Coagulation Studies: No results for input(s): LABPROT, INR in the last 72 hours.  Urinalysis: Recent Labs    10/01/17 0710  COLORURINE STRAW*  LABSPEC 1.006  PHURINE 8.0  GLUCOSEU NEGATIVE  HGBUR NEGATIVE  BILIRUBINUR NEGATIVE  KETONESUR NEGATIVE  PROTEINUR 100*  NITRITE NEGATIVE  LEUKOCYTESUR NEGATIVE  Imaging: No results found.   Medications:   . sodium chloride     . sodium chloride   Intravenous Once  . sodium chloride   Intravenous Once  . sodium chloride   Intravenous Once  . atorvastatin  80 mg Oral Daily  . carvedilol  6.25 mg Oral BID WC  . Chlorhexidine Gluconate Cloth  6 each Topical Q0600  . escitalopram  20 mg Oral Daily  . ferrous sulfate  325 mg Oral Q breakfast  . insulin aspart  0-9 Units Subcutaneous TID WC  . magnesium oxide  400 mg Oral Daily  . Melatonin  5 mg Oral QHS  . midodrine  10 mg Oral Q T,Th,Sa-HD  . midodrine  5 mg Oral Q T,Th,Sa-HD  . pantoprazole  40 mg Oral Daily  . rOPINIRole  0.5 mg Oral QHS  . sodium chloride flush  3 mL Intravenous Q12H  . tamsulosin  0.4 mg Oral Daily  . [START ON 10/05/2017] Vitamin D (Ergocalciferol)  50,000 Units Oral Q Fri   sodium chloride, acetaminophen **OR** acetaminophen, fluticasone, ondansetron **OR** ondansetron (ZOFRAN) IV, sodium chloride flush  Assessment/ Plan:  [name] is a 82 y.o. white male with end stage renal disease on hemodialysis, peptic ulcer disease, congestive heart failure, COPD, hypertension, diabetes mellitus type II insulin dependent, left warthin tumor, coronary artery disease status post CABG  [facility]/ [facility] [place]  1.  ESRD on HD TTS.  With hyperkalemia.  - Hold spironolactone due to hyperkalemia.  - TTS schedule, treatment for tomorrow.   2.  Anemia of chronic kidney disease with acute anemia blood loss. Patient states he was been having black stools.  Status post PRBC transfusions on 8/25 and then 2 units on 8/26 - Appreciate GI input - EPO with HD treatment - Iron supplements PO  3.  Secondary hyperparathyroidism: corrected calcium at goal. Phosphorus at goal.  - Not currently on binders.   4.  Hypertension: with midodrine for hemodialysis related hypotension - Holding spironolactone and amlodipine - Continue carvedilol and tamulosin.     LOS: 3 Brent Glover 8/28/20192:11 PM

## 2017-10-03 NOTE — Progress Notes (Signed)
Lucilla Lame, MD [facility]   [address], [place], [place] [address] Phone: [phone] Fax : [phone]   Subjective: This patient has anemia with a work-up 1 year ago with an EGD and colonoscopy that did not show any cause of his anemia.  The patient also has chronic kidney disease and was admitted with anemia.  The patient also had stools that was heme-negative in the past.  The patient has not had a bowel movement and his Hemoccult could not be checked.  The patient states that he would like to have a repeat EGD and colonoscopy but is scheduled for dialysis for tomorrow.   Objective: Vital signs in last 24 hours: Vitals:   10/02/17 1941 10/02/17 1942 10/03/17 0606 10/03/17 0857  BP: (!) 164/79 (!) 158/72 (!) 143/66 119/67  Pulse: 63 65 60 65  Resp:    19  Temp: 99.1 F (37.3 C)  97.8 F (36.6 C)   TempSrc: Oral  Oral   SpO2: 94% 92% 90% 94%  Weight:   73.9 kg   Height:       Weight change: 0.875 kg  Intake/Output Summary (Last 24 hours) at 10/03/2017 1320 Last data filed at 10/03/2017 1135 Gross per 24 hour  Intake 480 ml  Output 850 ml  Net -370 ml     Exam: Heart:: Regular rate and rhythm, S1S2 present or without murmur or extra heart sounds Lungs: normal and clear to auscultation and percussion Abdomen: soft, nontender, normal bowel sounds   Lab Results: @LABTEST2 @ Micro Results: No results found for this or any previous visit (from the past 240 hour(s)). Studies/Results: No results found. Medications: I have reviewed the patient's current medications. Scheduled Meds: . sodium chloride   Intravenous Once  . sodium chloride   Intravenous Once  . sodium chloride   Intravenous Once  . atorvastatin  80 mg Oral Daily  . carvedilol  6.25 mg Oral BID WC  . Chlorhexidine Gluconate Cloth  6 each Topical Q0600  . escitalopram  20 mg Oral Daily  . ferrous sulfate  325 mg Oral Q breakfast  . insulin aspart  0-9 Units Subcutaneous TID WC  . magnesium oxide   400 mg Oral Daily  . Melatonin  5 mg Oral QHS  . midodrine  10 mg Oral Q T,Th,Sa-HD  . midodrine  5 mg Oral Q T,Th,Sa-HD  . pantoprazole  40 mg Oral Daily  . rOPINIRole  0.5 mg Oral QHS  . sodium chloride flush  3 mL Intravenous Q12H  . tamsulosin  0.4 mg Oral Daily  . [START ON 10/05/2017] Vitamin D (Ergocalciferol)  50,000 Units Oral Q Fri   Continuous Infusions: . sodium chloride     PRN Meds:.sodium chloride, acetaminophen **OR** acetaminophen, fluticasone, ondansetron **OR** ondansetron (ZOFRAN) IV, sodium chloride flush   Assessment: Active Problems:   Syncope and collapse   Acute on chronic anemia   Anemia    Plan: This patient has had chronic anemia and a work-up with an EGD and colonoscopy in the past that was negative.  This was done approximately 1 year ago and since then he had stools checked that were heme negative.  The patient has not been able to have a bowel movement to have his stools checked again for blood.  The patient will be discharged with follow-up as an outpatient to look for source of possible GI bleeding.  The patient has been given my card and will follow-up as an outpatient.   LOS: 3 days  Jeimy Bickert 10/03/2017, 1:20 PM

## 2017-10-03 NOTE — Care Management (Signed)
No discharge needs identified by members of the care team 

## 2017-10-11 ENCOUNTER — Ambulatory Visit: Payer: Medicare Other | Admitting: Gastroenterology

## 2017-11-26 ENCOUNTER — Ambulatory Visit: Payer: Medicare Other | Admitting: Gastroenterology

## 2020-01-10 ENCOUNTER — Emergency Department
Admission: EM | Admit: 2020-01-10 | Discharge: 2020-01-10 | Disposition: A | Discharge status: Home / Self Care | Payer: Medicare Other | Attending: Emergency Medicine | Admitting: Emergency Medicine

## 2020-01-10 ENCOUNTER — Other Ambulatory Visit: Payer: Self-pay

## 2020-01-10 ENCOUNTER — Encounter: Payer: Self-pay | Admitting: Emergency Medicine

## 2020-01-10 DIAGNOSIS — R799 Abnormal finding of blood chemistry, unspecified: Secondary | ICD-10-CM | POA: Diagnosis present

## 2020-01-10 DIAGNOSIS — Z87891 Personal history of nicotine dependence: Secondary | ICD-10-CM | POA: Diagnosis not present

## 2020-01-10 DIAGNOSIS — I110 Hypertensive heart disease with heart failure: Secondary | ICD-10-CM | POA: Insufficient documentation

## 2020-01-10 DIAGNOSIS — I5041 Acute combined systolic (congestive) and diastolic (congestive) heart failure: Secondary | ICD-10-CM | POA: Insufficient documentation

## 2020-01-10 DIAGNOSIS — I214 Non-ST elevation (NSTEMI) myocardial infarction: Secondary | ICD-10-CM | POA: Diagnosis not present

## 2020-01-10 DIAGNOSIS — D649 Anemia, unspecified: Secondary | ICD-10-CM

## 2020-01-10 DIAGNOSIS — I2510 Atherosclerotic heart disease of native coronary artery without angina pectoris: Secondary | ICD-10-CM | POA: Diagnosis not present

## 2020-01-10 DIAGNOSIS — J449 Chronic obstructive pulmonary disease, unspecified: Secondary | ICD-10-CM | POA: Insufficient documentation

## 2020-01-10 DIAGNOSIS — I132 Hypertensive heart and chronic kidney disease with heart failure and with stage 5 chronic kidney disease, or end stage renal disease: Secondary | ICD-10-CM | POA: Diagnosis not present

## 2020-01-10 DIAGNOSIS — N186 End stage renal disease: Secondary | ICD-10-CM | POA: Insufficient documentation

## 2020-01-10 DIAGNOSIS — E119 Type 2 diabetes mellitus without complications: Secondary | ICD-10-CM | POA: Diagnosis not present

## 2020-01-10 DIAGNOSIS — R0602 Shortness of breath: Secondary | ICD-10-CM | POA: Insufficient documentation

## 2020-01-10 DIAGNOSIS — Z79899 Other long term (current) drug therapy: Secondary | ICD-10-CM | POA: Diagnosis not present

## 2020-01-10 DIAGNOSIS — N183 Chronic kidney disease, stage 3 unspecified: Secondary | ICD-10-CM | POA: Insufficient documentation

## 2020-01-10 DIAGNOSIS — Z95828 Presence of other vascular implants and grafts: Secondary | ICD-10-CM | POA: Diagnosis not present

## 2020-01-10 DIAGNOSIS — Z96659 Presence of unspecified artificial knee joint: Secondary | ICD-10-CM | POA: Insufficient documentation

## 2020-01-10 DIAGNOSIS — I129 Hypertensive chronic kidney disease with stage 1 through stage 4 chronic kidney disease, or unspecified chronic kidney disease: Secondary | ICD-10-CM | POA: Insufficient documentation

## 2020-01-10 DIAGNOSIS — Z7982 Long term (current) use of aspirin: Secondary | ICD-10-CM | POA: Insufficient documentation

## 2020-01-10 LAB — COMPREHENSIVE METABOLIC PANEL
ALT: 79 U/L — ABNORMAL HIGH (ref 0–44)
AST: 43 U/L — ABNORMAL HIGH (ref 15–41)
Albumin: 3.2 g/dL — ABNORMAL LOW (ref 3.5–5.0)
Alkaline Phosphatase: 135 U/L — ABNORMAL HIGH (ref 38–126)
Anion gap: 10 (ref 5–15)
BUN: 17 mg/dL (ref 8–23)
CO2: 30 mmol/L (ref 22–32)
Calcium: 8.1 mg/dL — ABNORMAL LOW (ref 8.9–10.3)
Chloride: 94 mmol/L — ABNORMAL LOW (ref 98–111)
Creatinine, Ser: 1.89 mg/dL — ABNORMAL HIGH (ref 0.61–1.24)
GFR, Estimated: 35 mL/min — ABNORMAL LOW (ref >60)
Glucose, Bld: 92 mg/dL (ref 70–99)
Potassium: 3.2 mmol/L — ABNORMAL LOW (ref 3.5–5.1)
Sodium: 134 mmol/L — ABNORMAL LOW (ref 135–145)
Total Bilirubin: 0.6 mg/dL (ref 0.3–1.2)
Total Protein: 6.2 g/dL — ABNORMAL LOW (ref 6.5–8.1)

## 2020-01-10 LAB — CBC
HCT: 22.2 % — ABNORMAL LOW (ref 39.0–52.0)
Hemoglobin: 6.9 g/dL — ABNORMAL LOW (ref 13.0–17.0)
MCH: 32.9 pg (ref 26.0–34.0)
MCHC: 31.1 g/dL (ref 30.0–36.0)
MCV: 105.7 fL — ABNORMAL HIGH (ref 80.0–100.0)
Platelets: 136 10*3/uL — ABNORMAL LOW (ref 150–400)
RBC: 2.1 MIL/uL — ABNORMAL LOW (ref 4.22–5.81)
RDW: 19.4 % — ABNORMAL HIGH (ref 11.5–15.5)
WBC: 6 10*3/uL (ref 4.0–10.5)
nRBC: 0 % (ref 0.0–0.2)

## 2020-01-10 LAB — BPAM RBC
Blood Product Expiration Date: 202201052359
ISSUE DATE / TIME: 202112041240

## 2020-01-10 LAB — TYPE AND SCREEN: Unit division: 0

## 2020-01-10 LAB — PROTIME-INR
INR: 1.1 (ref 0.8–1.2)
Prothrombin Time: 14 seconds (ref 11.4–15.2)

## 2020-01-10 LAB — PREPARE RBC (CROSSMATCH)

## 2020-01-10 MED ORDER — SODIUM CHLORIDE 0.9 % IV SOLN: 10.0000 mL/h | Freq: Once | INTRAVENOUS | Status: DC

## 2020-01-10 MED ORDER — SODIUM CHLORIDE 0.9% IV SOLUTION
Freq: Once | INTRAVENOUS | Status: AC
  Filled 2020-01-10: qty 250

## 2020-01-10 NOTE — Discharge Instructions (Addendum)
You have been seen in the emergency department today for low blood count.  You have been given 1 unit of blood.  Please follow-up with your doctor on Monday.  Return to the emergency department for any shortness of breath, worsening fatigue/weakness, or any other symptom personally concerning to yourself.

## 2020-01-10 NOTE — ED Provider Notes (Signed)
[facility] Emergency Department Provider Note  Time seen: 1:20 PM  I have reviewed the triage vital signs and the nursing notes.   HISTORY  Chief Complaint Abnormal Lab   HPI Brent Glover is a 84 y.o. male with a past medical history of CHF, COPD, diabetes, hypertension, hyperlipidemia, ESRD on HD, presents to the emergency department for low hemoglobin.  According to the patient for the past few weeks he has been feeling fatigued and weak especially with exertion.  He saw his doctor who performed lab work and was told that his hemoglobin level is low and he should come to the emergency department for evaluation.  Patient states he has a history of low blood counts in the past due to his kidneys.  Has never had a GI bleed per patient.  Denies any black or bloody stool.  Denies any abdominal pain.  No chest pain.  Does state mild shortness of breath with exertion.  No cough or fever.   Past Medical History:  Diagnosis Date  . BPH (benign prostatic hyperplasia)   . Chronic diastolic heart failure ([place])   . COPD (chronic obstructive pulmonary disease) ([place])   . Diabetes mellitus without complication ([place])   . ESRD (end stage renal disease) ([place])    stage III  . Hyperlipidemia   . Hypertension     Patient Active Problem List   Diagnosis Date Noted  . Anemia   . Acute on chronic anemia 09/30/2017  . Syncope and collapse 09/29/2017  . Chronic kidney disease   . CAD in native artery 11/23/2014  . H/O coronary artery bypass surgery 10/28/2014  . Acute non-ST elevation myocardial infarction (NSTEMI) ([place]) 10/15/2014  . Chronic kidney disease (CKD), stage III (moderate) ([place]) 03/17/2014  . Creatinine elevation 03/11/2014  . Chronic combined systolic and diastolic heart failure ([place]) 11/29/2012  . Claudication ([place]) 06/25/2012  . Chronic diastolic heart failure ([place])   . Hypertension   . Type 2 diabetes mellitus ([place]) 09/21/2011  . Aneurysm ([place]) 10/07/2010   . Arthritis, degenerative 09/09/2010  . Hypercholesterolemia 04/04/2009  . Benign hypertension 09/04/2008    Past Surgical History:  Procedure Laterality Date  . ABDOMINAL AORTIC ENDOVASCULAR STENT GRAFT    . CYSTOSCOPY    . KNEE ARTHROPLASTY      Prior to Admission medications   Medication Sig Start Date End Date Taking? Authorizing Provider  acetaminophen (TYLENOL) 500 MG tablet Take 500 mg by mouth every 6 (six) hours as needed for mild pain.    [provider]  amLODipine (NORVASC) 10 MG tablet Take 0.5 tablets (5 mg total) by mouth daily. Patient not taking: Reported on 09/29/2017 06/25/12   Wellington Hampshire, MD  aspirin 81 MG tablet Take 81 mg by mouth daily.    [provider]  atorvastatin (LIPITOR) 80 MG tablet Take 80 mg by mouth daily. 08/17/17   [provider]  carvedilol (COREG) 6.25 MG tablet Take 6.25 mg by mouth 2 (two) times daily with a meal.    [provider]  ergocalciferol (VITAMIN D2) 50000 units capsule Take 50,000 Units by mouth every Friday.    [provider]  escitalopram (LEXAPRO) 10 MG tablet Take 20 mg by mouth daily.  12/21/14 09/30/18  [provider]  ferrous sulfate 325 (65 FE) MG tablet Take 325 mg by mouth daily with breakfast.    [provider]  fluticasone (FLONASE) 50 MCG/ACT nasal spray Place 2 sprays into both nostrils daily  as needed for allergies. 07/03/17   [provider]  furosemide (LASIX) 20 MG tablet Take 1 tablet (20 mg total) by mouth daily. Patient not taking: Reported on 09/29/2017 06/27/12   Wellington Hampshire, MD  Melatonin 5 MG TABS Take 5 mg by mouth at bedtime.    [provider]  midodrine (PROAMATINE) 5 MG tablet Take 5-10 mg by mouth Every Tuesday,Thursday,and Saturday with dialysis. Give 10 mg by mouth 30 minutes before dialysis and 5 mg by mouth half way through dialysis.    [provider]  pantoprazole (PROTONIX) 40 MG tablet Take 40 mg  by mouth daily.    [provider]  rOPINIRole (REQUIP) 0.25 MG tablet Take 0.5 mg by mouth at bedtime. Reported on 02/10/2015    [provider]  tamsulosin (FLOMAX) 0.4 MG CAPS Take 0.4 mg by mouth daily.    [provider]  Vitamin D, Ergocalciferol, (DRISDOL) 50000 units CAPS capsule Take 1 capsule (50,000 Units total) by mouth every Friday. 10/05/17   [name], MD    Allergies  Allergen Reactions  . Montelukast Shortness Of Breath  . Trazodone Other (See Comments)    "it hypes me up instead of putting me to sleep"  . Hydrocodone Nausea And Vomiting    History reviewed. No pertinent family history.  Social History Social History   Tobacco Use  . Smoking status: Former Smoker    Packs/day: 1.00    Years: 60.00    Pack years: 60.00    Types: Cigarettes    Quit date: 10/11/2014    Years since quitting: 5.2  . Smokeless tobacco: Never Used  Substance Use Topics  . Alcohol use: No  . Drug use: No    Review of Systems Constitutional: Negative for fever.  Positive for generalized fatigue/weakness worse with exertion Cardiovascular: Negative for chest pain. Respiratory: Mild shortness of breath with exertion Gastrointestinal: Negative for abdominal pain, vomiting  Musculoskeletal: Negative for musculoskeletal complaints Neurological: Negative for headache All other ROS negative  ____________________________________________   PHYSICAL EXAM:  VITAL SIGNS: ED Triage Vitals  Enc Vitals Group     BP 01/10/20 1032 110/72     Pulse Rate 01/10/20 1032 (!) 57     Resp 01/10/20 1032 20     Temp 01/10/20 1032 97.8 F (36.6 C)     Temp Source 01/10/20 1032 Oral     SpO2 01/10/20 1032 100 %     Weight 01/10/20 1033 176 lb (79.8 kg)     Height 01/10/20 1033 5\' 7"  (1.702 m)     Head Circumference --      Peak Flow --      Pain Score 01/10/20 1033 0     Pain Loc --      Pain Edu? --      Excl. in [place]? --    Constitutional: Alert and oriented.  Well appearing and in no distress. Eyes: Normal exam ENT      Head: Normocephalic and atraumatic.      Mouth/Throat: Mucous membranes are moist. Cardiovascular: Normal rate, regular rhythm.  Respiratory: Normal respiratory effort without tachypnea nor retractions. Breath sounds are clear  Gastrointestinal: Soft and nontender. No distention. Musculoskeletal: Nontender with normal range of motion in all extremities.  Neurologic:  Normal speech and language. No gross focal neurologic deficits  Skin:  Skin is warm, dry and intact.  Psychiatric: Mood and affect are normal.  ____________________________________________    EKG  EKG viewed and interpreted by  myself shows a sinus rhythm at 63 bpm with a narrow QRS, normal axis, slight QTC prolongation otherwise normal intervals.  Frequent PVCs.  ____________________________________________   INITIAL IMPRESSION / ASSESSMENT AND PLAN / ED COURSE  Pertinent labs & imaging results that were available during my care of the patient were reviewed by me and considered in my medical decision making (see chart for details).   Patient presents to the emergency department for generalized fatigue and weakness worse with exertion.  Patient's lab work shows a hemoglobin of 6.9, baseline appears to be between 8 and 9.  Rectal examination shows light brown stool, guaiac negative.  We will transfuse 1 unit of blood for the patient.  Highly suspect the patient's anemia is due to renal disease.  With no ongoing or active bleed I believe the patient would be safe for discharge home once transfused.  We will reassess once transfused to decide upon disposition.  Patient agreeable to plan of care.  Overall the patient appears well.  Patient receiving blood products.  Feels well.  Anticipate discharge home upon completion with follow-up with his doctor.  Patient agreeable to this plan as well.  Discussed return precautions.  Brent Glover was evaluated in Emergency  Department on 01/10/2020 for the symptoms described in the history of present illness. He was evaluated in the context of the global COVID-19 pandemic, which necessitated consideration that the patient might be at risk for infection with the SARS-CoV-2 virus that causes COVID-19. Institutional protocols and algorithms that pertain to the evaluation of patients at risk for COVID-19 are in a state of rapid change based on information released by regulatory bodies including the CDC and federal and state organizations. These policies and algorithms were followed during the patient's care in the ED.  ____________________________________________   FINAL CLINICAL IMPRESSION(S) / ED DIAGNOSES  Symptomatic anemia   Harvest Dark, MD 01/10/20 1427

## 2020-01-10 NOTE — ED Notes (Signed)
Family at bedside. 

## 2020-01-10 NOTE — ED Triage Notes (Signed)
Pt to ED with c/o low hemoglobin. Pt states feels weak and also c/o general malaise at this time.

## 2020-01-11 LAB — BPAM RBC: Unit Type and Rh: 5100

## 2020-01-11 LAB — TYPE AND SCREEN
ABO/RH(D): O POS
Antibody Screen: NEGATIVE

## 2020-07-10 ENCOUNTER — Emergency Department: Payer: Medicare Other

## 2020-07-10 ENCOUNTER — Emergency Department
Admission: EM | Admit: 2020-07-10 | Discharge: 2020-07-10 | Discharge status: Against Medical Advice | Payer: Medicare Other | Attending: Emergency Medicine | Admitting: Emergency Medicine

## 2020-07-10 ENCOUNTER — Encounter: Payer: Self-pay | Admitting: Emergency Medicine

## 2020-07-10 ENCOUNTER — Other Ambulatory Visit: Payer: Self-pay

## 2020-07-10 DIAGNOSIS — J449 Chronic obstructive pulmonary disease, unspecified: Secondary | ICD-10-CM | POA: Insufficient documentation

## 2020-07-10 DIAGNOSIS — W06XXXA Fall from bed, initial encounter: Secondary | ICD-10-CM | POA: Diagnosis not present

## 2020-07-10 DIAGNOSIS — S0181XA Laceration without foreign body of other part of head, initial encounter: Secondary | ICD-10-CM | POA: Insufficient documentation

## 2020-07-10 DIAGNOSIS — Z7952 Long term (current) use of systemic steroids: Secondary | ICD-10-CM | POA: Diagnosis not present

## 2020-07-10 DIAGNOSIS — Z79899 Other long term (current) drug therapy: Secondary | ICD-10-CM | POA: Diagnosis not present

## 2020-07-10 DIAGNOSIS — I2510 Atherosclerotic heart disease of native coronary artery without angina pectoris: Secondary | ICD-10-CM | POA: Diagnosis not present

## 2020-07-10 DIAGNOSIS — Z7982 Long term (current) use of aspirin: Secondary | ICD-10-CM | POA: Diagnosis not present

## 2020-07-10 DIAGNOSIS — S0990XA Unspecified injury of head, initial encounter: Secondary | ICD-10-CM | POA: Diagnosis present

## 2020-07-10 DIAGNOSIS — R531 Weakness: Secondary | ICD-10-CM | POA: Insufficient documentation

## 2020-07-10 DIAGNOSIS — I132 Hypertensive heart and chronic kidney disease with heart failure and with stage 5 chronic kidney disease, or end stage renal disease: Secondary | ICD-10-CM | POA: Insufficient documentation

## 2020-07-10 DIAGNOSIS — I5042 Chronic combined systolic (congestive) and diastolic (congestive) heart failure: Secondary | ICD-10-CM | POA: Insufficient documentation

## 2020-07-10 DIAGNOSIS — R42 Dizziness and giddiness: Secondary | ICD-10-CM | POA: Insufficient documentation

## 2020-07-10 DIAGNOSIS — Z992 Dependence on renal dialysis: Secondary | ICD-10-CM | POA: Insufficient documentation

## 2020-07-10 DIAGNOSIS — E1122 Type 2 diabetes mellitus with diabetic chronic kidney disease: Secondary | ICD-10-CM | POA: Insufficient documentation

## 2020-07-10 DIAGNOSIS — N186 End stage renal disease: Secondary | ICD-10-CM | POA: Insufficient documentation

## 2020-07-10 DIAGNOSIS — Z87891 Personal history of nicotine dependence: Secondary | ICD-10-CM | POA: Diagnosis not present

## 2020-07-10 DIAGNOSIS — R55 Syncope and collapse: Secondary | ICD-10-CM | POA: Insufficient documentation

## 2020-07-10 DIAGNOSIS — S0083XA Contusion of other part of head, initial encounter: Secondary | ICD-10-CM

## 2020-07-10 LAB — CBC WITH DIFFERENTIAL/PLATELET
Abs Immature Granulocytes: 0.04 10*3/uL (ref 0.00–0.07)
Basophils Absolute: 0.1 10*3/uL (ref 0.0–0.1)
Basophils Relative: 1 %
Eosinophils Absolute: 0.1 10*3/uL (ref 0.0–0.5)
Eosinophils Relative: 2 %
HCT: 30 % — ABNORMAL LOW (ref 39.0–52.0)
Hemoglobin: 9.7 g/dL — ABNORMAL LOW (ref 13.0–17.0)
Immature Granulocytes: 1 %
Lymphocytes Relative: 10 %
Lymphs Abs: 0.5 10*3/uL — ABNORMAL LOW (ref 0.7–4.0)
MCH: 31.3 pg (ref 26.0–34.0)
MCHC: 32.3 g/dL (ref 30.0–36.0)
MCV: 96.8 fL (ref 80.0–100.0)
Monocytes Absolute: 0.6 10*3/uL (ref 0.1–1.0)
Monocytes Relative: 12 %
Neutro Abs: 3.9 10*3/uL (ref 1.7–7.7)
Neutrophils Relative %: 74 %
Platelets: 74 10*3/uL — ABNORMAL LOW (ref 150–400)
RBC: 3.1 MIL/uL — ABNORMAL LOW (ref 4.22–5.81)
RDW: 18.2 % — ABNORMAL HIGH (ref 11.5–15.5)
Smear Review: NORMAL
WBC: 5.2 10*3/uL (ref 4.0–10.5)
nRBC: 1 % — ABNORMAL HIGH (ref 0.0–0.2)

## 2020-07-10 LAB — BASIC METABOLIC PANEL
Anion gap: 12 (ref 5–15)
BUN: 16 mg/dL (ref 8–23)
CO2: 32 mmol/L (ref 22–32)
Calcium: 8.7 mg/dL — ABNORMAL LOW (ref 8.9–10.3)
Chloride: 93 mmol/L — ABNORMAL LOW (ref 98–111)
Creatinine, Ser: 2.31 mg/dL — ABNORMAL HIGH (ref 0.61–1.24)
GFR, Estimated: 28 mL/min — ABNORMAL LOW (ref >60)
Glucose, Bld: 92 mg/dL (ref 70–99)
Potassium: 3.3 mmol/L — ABNORMAL LOW (ref 3.5–5.1)
Sodium: 137 mmol/L (ref 135–145)

## 2020-07-10 LAB — TROPONIN I (HIGH SENSITIVITY): Troponin I (High Sensitivity): 32 ng/L — ABNORMAL HIGH (ref <18)

## 2020-07-10 LAB — BRAIN NATRIURETIC PEPTIDE: B Natriuretic Peptide: >4500 pg/mL — ABNORMAL HIGH (ref 0.0–100.0)

## 2020-07-10 NOTE — ED Triage Notes (Signed)
Fell this morning.  States was sitting on the side of the bed and fell forward.  Hit left forehead on nightstand.  No LOC.  Laceration to left forehead, bleeding controlled.

## 2020-07-10 NOTE — Discharge Instructions (Addendum)
Patient is leaving AMA after verbalizing understanding of risk factors leaving and will follow further medical evaluation.  Follow discharge care instruction continue previous medications.  Labs and x-ray has been sent to your PCP and will be available for evaluation.  Advised return back to ED if condition worsens.  Advised to discontinue use of Benadryl until reevaluation by PCP.

## 2020-07-10 NOTE — ED Provider Notes (Signed)
[facility] Emergency Department Provider Note   ____________________________________________   Event Date/Time   First MD Initiated Contact with Patient 07/10/20 1156     (approximate)  I have reviewed the triage vital signs and the nursing notes.   HISTORY  Chief Complaint Head Injury    HPI Brent Glover is a 85 y.o. male patient awakened this morning with sitting on the side of the bed when he fell forward hit his forehead on the nightstand.  Patient said there was no LOC.  Patient has a laceration to left forehead and hematoma to the left supraorbital area.  Daughter is giving further history of repetitive falls and last week that was not evaluated by medical facility.  Patient is a dialysis patient and was sent over after his session this morning for further evaluation.      Past Medical History:  Diagnosis Date  . BPH (benign prostatic hyperplasia)   . Chronic diastolic heart failure ([place])   . COPD (chronic obstructive pulmonary disease) ([place])   . Diabetes mellitus without complication ([place])   . ESRD (end stage renal disease) ([place])    stage III  . Hyperlipidemia   . Hypertension     Patient Active Problem List   Diagnosis Date Noted  . Anemia   . Acute on chronic anemia 09/30/2017  . Syncope and collapse 09/29/2017  . Chronic kidney disease   . CAD in native artery 11/23/2014  . H/O coronary artery bypass surgery 10/28/2014  . Acute non-ST elevation myocardial infarction (NSTEMI) ([place]) 10/15/2014  . Chronic kidney disease (CKD), stage III (moderate) ([place]) 03/17/2014  . Creatinine elevation 03/11/2014  . Chronic combined systolic and diastolic heart failure ([place]) 11/29/2012  . Claudication ([place]) 06/25/2012  . Chronic diastolic heart failure ([place])   . Hypertension   . Type 2 diabetes mellitus ([place]) 09/21/2011  . Aneurysm ([place]) 10/07/2010  . Arthritis, degenerative 09/09/2010  . Hypercholesterolemia 04/04/2009  . Benign  hypertension 09/04/2008    Past Surgical History:  Procedure Laterality Date  . ABDOMINAL AORTIC ENDOVASCULAR STENT GRAFT    . CYSTOSCOPY    . KNEE ARTHROPLASTY      Prior to Admission medications   Medication Sig Start Date End Date Taking? Authorizing Provider  acetaminophen (TYLENOL) 500 MG tablet Take 500 mg by mouth every 6 (six) hours as needed for mild pain.    [provider]  amLODipine (NORVASC) 10 MG tablet Take 0.5 tablets (5 mg total) by mouth daily. Patient not taking: Reported on 09/29/2017 06/25/12   Wellington Hampshire, MD  aspirin 81 MG tablet Take 81 mg by mouth daily.    [provider]  atorvastatin (LIPITOR) 80 MG tablet Take 80 mg by mouth daily. 08/17/17   [provider]  carvedilol (COREG) 6.25 MG tablet Take 6.25 mg by mouth 2 (two) times daily with a meal.    [provider]  ergocalciferol (VITAMIN D2) 50000 units capsule Take 50,000 Units by mouth every Friday.    [provider]  escitalopram (LEXAPRO) 10 MG tablet Take 20 mg by mouth daily.  12/21/14 09/30/18  [provider]  ferrous sulfate 325 (65 FE) MG tablet Take 325 mg by mouth daily with breakfast.    [provider]  fluticasone (FLONASE) 50 MCG/ACT nasal spray Place 2 sprays into both nostrils daily as needed for allergies. 07/03/17   [provider]  furosemide (LASIX) 20 MG tablet Take 1 tablet (20 mg total)  by mouth daily. Patient not taking: Reported on 09/29/2017 06/27/12   Wellington Hampshire, MD  Melatonin 5 MG TABS Take 5 mg by mouth at bedtime.    [provider]  midodrine (PROAMATINE) 5 MG tablet Take 5-10 mg by mouth Every Tuesday,Thursday,and Saturday with dialysis. Give 10 mg by mouth 30 minutes before dialysis and 5 mg by mouth half way through dialysis.    [provider]  pantoprazole (PROTONIX) 40 MG tablet Take 40 mg by mouth daily.    [provider]  rOPINIRole (REQUIP) 0.25 MG tablet Take  0.5 mg by mouth at bedtime. Reported on 02/10/2015    [provider]  tamsulosin (FLOMAX) 0.4 MG CAPS Take 0.4 mg by mouth daily.    [provider]  Vitamin D, Ergocalciferol, (DRISDOL) 50000 units CAPS capsule Take 1 capsule (50,000 Units total) by mouth every Friday. 10/05/17   [name], MD    Allergies Montelukast, Trazodone, and Hydrocodone  No family history on file.  Social History Social History   Tobacco Use  . Smoking status: Former Smoker    Packs/day: 1.00    Years: 60.00    Pack years: 60.00    Types: Cigarettes    Quit date: 10/11/2014    Years since quitting: 5.7  . Smokeless tobacco: Never Used  Substance Use Topics  . Alcohol use: No  . Drug use: No    Review of Systems Constitutional: No fever/chills Eyes: No visual changes. ENT: No sore throat. Cardiovascular: Denies chest pain. Respiratory: Denies shortness of breath. Gastrointestinal: No abdominal pain.  No nausea, no vomiting.  No diarrhea.  No constipation. Genitourinary: Negative for dysuria. Musculoskeletal: Negative for back pain. Skin: Negative for rash. Neurological: Negative for headaches, focal weakness or numbness. Endocrine:  Diabetes, end-stage renal disease, hyperlipidemia, hypertension. Allergic/Immunilogical: Trazodone hydrocodone ____________________________________________   PHYSICAL EXAM:  VITAL SIGNS: ED Triage Vitals  Enc Vitals Group     BP 07/10/20 1102 122/61     Pulse Rate 07/10/20 1102 (!) 51     Resp 07/10/20 1102 17     Temp 07/10/20 1102 97.9 F (36.6 C)     Temp Source 07/10/20 1102 Oral     SpO2 07/10/20 1102 96 %     Weight 07/10/20 1053 175 lb 14.8 oz (79.8 kg)     Height 07/10/20 1053 '5\' 7"'$  (1.702 m)     Head Circumference --      Peak Flow --      Pain Score 07/10/20 1052 5     Pain Loc --      Pain Edu? --      Excl. in [place]? --    Constitutional: Alert and oriented. Well appearing and in no acute distress. Eyes: Conjunctivae are  normal. PERRL. EOMI. Head: Atraumatic. Nose: No congestion/rhinnorhea. Mouth/Throat: Mucous membranes are moist.  Oropharynx non-erythematous. Neck: No stridor.  No cervical spine tenderness to palpation. Hematological/Lymphatic/Immunilogical: No cervical lymphadenopathy. Cardiovascular: Bradycardic, regular rhythm. Grossly normal heart sounds.  Good peripheral circulation. Respiratory: Normal respiratory effort.  No retractions. Lungs CTAB. Gastrointestinal: Soft and nontender. No distention. No abdominal bruits. No CVA tenderness. Genitourinary: Deferred Musculoskeletal: No lower extremity tenderness nor edema.  No joint effusions. Neurologic:  Normal speech and language. No gross focal neurologic deficits are appreciated. No gait instability. Skin:  Skin is warm, dry and intact. No rash noted. Psychiatric: Mood and affect are normal. Speech and behavior are normal.  ____________________________________________   LABS (all labs ordered are listed, but  only abnormal results are displayed)  Labs Reviewed  CBC WITH DIFFERENTIAL/PLATELET - Abnormal; Notable for the following components:      Result Value   RBC 3.10 (*)    Hemoglobin 9.7 (*)    HCT 30.0 (*)    RDW 18.2 (*)    Platelets 74 (*)    nRBC 1.0 (*)    Lymphs Abs 0.5 (*)    All other components within normal limits  BASIC METABOLIC PANEL - Abnormal; Notable for the following components:   Potassium 3.3 (*)    Chloride 93 (*)    Creatinine, Ser 2.31 (*)    Calcium 8.7 (*)    GFR, Estimated 28 (*)    All other components within normal limits  BRAIN NATRIURETIC PEPTIDE - Abnormal; Notable for the following components:   B Natriuretic Peptide >4,500.0 (*)    All other components within normal limits  TROPONIN I (HIGH SENSITIVITY) - Abnormal; Notable for the following components:   Troponin I (High Sensitivity) 32 (*)    All other components within normal limits  POC SARS CORONAVIRUS 2 AG -  ED  TROPONIN I (HIGH  SENSITIVITY)   ____________________________________________  EKG  Read by heart station Dr. ____________________________________________  RADIOLOGY I, Sable Feil, personally viewed and evaluated these images (plain radiographs) as part of my medical decision making, as well as reviewing the written report by the radiologist.  ED MD interpretation: No acute findings on chest x-ray  Official radiology report(s): DG Chest 2 View  Result Date: 07/10/2020 CLINICAL DATA:  Mild dyspnea with exertion EXAM: CHEST - 2 VIEW COMPARISON:  None. FINDINGS: Sternal wires overlie enlarged cardiac silhouette. No effusion, infiltrate, or pneumothorax. No acute osseous abnormality. IMPRESSION: No acute cardiopulmonary process. Electronically Signed   By: Suzy Bouchard M.D.   On: 07/10/2020 13:07   CT Head Wo Contrast  Result Date: 07/10/2020 CLINICAL DATA:  85 year old male status post fall off of bed this morning. Struck forehead on night stand. EXAM: CT HEAD WITHOUT CONTRAST TECHNIQUE: Contiguous axial images were obtained from the base of the skull through the vertex without intravenous contrast. COMPARISON:  None. FINDINGS: Brain: Moderate scattered hypodensity in the bilateral deep gray matter nuclei, most affecting the right thalamus. No cortical encephalomalacia identified. Extra-axial CSF along the frontal convexities appears to be related to volume loss. No acute intracranial hemorrhage identified. No midline shift, ventriculomegaly, mass effect, evidence of mass lesion, or evidence of cortically based acute infarction. Vascular: Extensive Calcified atherosclerosis at the skull base. No suspicious intracranial vascular hyperdensity. Skull: No fracture identified. Sinuses/Orbits: Opacified and hyperdense right frontal sinus, although no superimposed right frontal bone fracture identified. Contralateral left frontal sinus and remaining visualized paranasal sinuses and mastoids are clear. Other: Left  forehead scalp hematoma is broad-based with small volume of soft tissue gas medially (series 2, image 14). Underlying left frontal bone and sinus appear to remain intact. Negative other scalp soft tissues. Visible intraorbital soft tissues appear normal. IMPRESSION: 1. Left forehead scalp hematoma without underlying skull fracture. 2. No acute traumatic injury to the brain identified. 3. Moderate for age chronic small vessel disease in the deep gray nuclei. 4. Hyperdense but probably inflammatory opacification of the right frontal sinus, inspissated secretions. Electronically Signed   By: Genevie Ann M.D.   On: 07/10/2020 11:27   CT Cervical Spine Wo Contrast  Result Date: 07/10/2020 CLINICAL DATA:  85 year old male status post fall off of bed this morning. Struck forehead on night stand. EXAM: CT  CERVICAL SPINE WITHOUT CONTRAST TECHNIQUE: Multidetector CT imaging of the cervical spine was performed without intravenous contrast. Multiplanar CT image reconstructions were also generated. COMPARISON:  Head CT today reported separately. FINDINGS: Mild motion artifact. Alignment: Mild degenerative appearing anterolisthesis of C5 on C6 with straightening of lordosis. Cervicothoracic junction alignment is within normal limits. Bilateral posterior element alignment is within normal limits. Skull base and vertebrae: Visualized skull base is intact. No atlanto-occipital dissociation. Congenital incomplete ossification of the posterior C1 ring especially on the left. Preserved C1-C2 alignment. No acute osseous abnormality identified. Soft tissues and spinal canal: No prevertebral fluid or swelling. No visible canal hematoma. Calcified carotid atherosclerosis in the neck. Surgical clips in the left parotid space. Left stylomastoid foramen soft tissue appears to remain normal. Disc levels: Moderate to severe chronic facet degeneration on the left at C3-C4 and on the right at C4-C5 with some vacuum facet. Disc and endplate  degeneration at both levels with up to mild spinal stenosis. Upper chest: Visible upper thoracic levels appear grossly intact. There is mild apical subpleural lung scarring. IMPRESSION: 1. Mildly motion degraded. No acute traumatic injury identified in the cervical spine. 2. Multilevel cervical spine degeneration with up to mild spinal stenosis. Electronically Signed   By: Genevie Ann M.D.   On: 07/10/2020 11:42    ____________________________________________   PROCEDURES  Procedure(s) performed (including Critical Care):  Procedures   ____________________________________________   INITIAL IMPRESSION / ASSESSMENT AND PLAN / ED COURSE  As part of my medical decision making, I reviewed the following data within the [facility]         Patient presents facial contusion laceration secondary to a fall.  Patient denies LOC.  Patient was further evaluated secondary to complaint of multiple falls with labs and CT scans.  Discussed no acute findings on CT scan.  Discussed lab results with patient.  Patient complaint physical exam is consistent with near syncope episodes.  Unsure of these episodes are sequela to his Benadryl which is taken for sleep aid.  Patient refused admission for monitoring and evaluation.  Discussed with patient AMA sequela.  Patient adamant that he does not want to be admitted.  See procedure note for wound closure.  Patient advised to discontinue Benadryl but continue other medications.  Follow-up with PCP.      ____________________________________________   FINAL CLINICAL IMPRESSION(S) / ED DIAGNOSES  Final diagnoses:  Contusion of face, initial encounter  Facial laceration, initial encounter  Postural dizziness with near syncope  Weakness generalized     ED Discharge Orders    None       Note:  This document was prepared using Dragon voice recognition software and may include unintentional dictation errors.    Sable Feil,  PA-C 07/10/20 1533    Arta Silence, MD 07/10/20 [phone]

## 2020-07-10 NOTE — ED Notes (Signed)
Patient is refusing to stay, states "Gaspar Cola is taking care of me." Randel Pigg PA-C aware. Patient was cooperative with getting back on the stretcher to have laceration repaired.

## 2020-11-11 ENCOUNTER — Inpatient Hospital Stay
Admission: EM | Admit: 2020-11-11 | Discharge: 2020-11-15 | DRG: 371 | Disposition: A | Discharge status: Home Health | Payer: Medicare Other | Attending: Internal Medicine | Admitting: Internal Medicine

## 2020-11-11 ENCOUNTER — Other Ambulatory Visit: Payer: Self-pay

## 2020-11-11 ENCOUNTER — Encounter: Payer: Self-pay | Admitting: *Deleted

## 2020-11-11 ENCOUNTER — Emergency Department: Payer: Medicare Other

## 2020-11-11 DIAGNOSIS — E785 Hyperlipidemia, unspecified: Secondary | ICD-10-CM | POA: Diagnosis present

## 2020-11-11 DIAGNOSIS — E1129 Type 2 diabetes mellitus with other diabetic kidney complication: Secondary | ICD-10-CM | POA: Insufficient documentation

## 2020-11-11 DIAGNOSIS — I2510 Atherosclerotic heart disease of native coronary artery without angina pectoris: Secondary | ICD-10-CM | POA: Diagnosis present

## 2020-11-11 DIAGNOSIS — Z833 Family history of diabetes mellitus: Secondary | ICD-10-CM

## 2020-11-11 DIAGNOSIS — N186 End stage renal disease: Secondary | ICD-10-CM | POA: Diagnosis present

## 2020-11-11 DIAGNOSIS — R296 Repeated falls: Secondary | ICD-10-CM | POA: Diagnosis present

## 2020-11-11 DIAGNOSIS — D696 Thrombocytopenia, unspecified: Secondary | ICD-10-CM | POA: Diagnosis present

## 2020-11-11 DIAGNOSIS — Z96659 Presence of unspecified artificial knee joint: Secondary | ICD-10-CM | POA: Diagnosis present

## 2020-11-11 DIAGNOSIS — E7800 Pure hypercholesterolemia, unspecified: Secondary | ICD-10-CM | POA: Diagnosis present

## 2020-11-11 DIAGNOSIS — D500 Iron deficiency anemia secondary to blood loss (chronic): Secondary | ICD-10-CM | POA: Diagnosis present

## 2020-11-11 DIAGNOSIS — I959 Hypotension, unspecified: Secondary | ICD-10-CM | POA: Diagnosis present

## 2020-11-11 DIAGNOSIS — S0083XA Contusion of other part of head, initial encounter: Secondary | ICD-10-CM | POA: Diagnosis present

## 2020-11-11 DIAGNOSIS — I5042 Chronic combined systolic (congestive) and diastolic (congestive) heart failure: Secondary | ICD-10-CM | POA: Diagnosis present

## 2020-11-11 DIAGNOSIS — Z888 Allergy status to other drugs, medicaments and biological substances status: Secondary | ICD-10-CM

## 2020-11-11 DIAGNOSIS — Z951 Presence of aortocoronary bypass graft: Secondary | ICD-10-CM | POA: Diagnosis not present

## 2020-11-11 DIAGNOSIS — E1122 Type 2 diabetes mellitus with diabetic chronic kidney disease: Secondary | ICD-10-CM | POA: Diagnosis present

## 2020-11-11 DIAGNOSIS — D61818 Other pancytopenia: Secondary | ICD-10-CM | POA: Diagnosis present

## 2020-11-11 DIAGNOSIS — R001 Bradycardia, unspecified: Secondary | ICD-10-CM | POA: Diagnosis not present

## 2020-11-11 DIAGNOSIS — N2581 Secondary hyperparathyroidism of renal origin: Secondary | ICD-10-CM | POA: Diagnosis present

## 2020-11-11 DIAGNOSIS — Z87891 Personal history of nicotine dependence: Secondary | ICD-10-CM | POA: Diagnosis not present

## 2020-11-11 DIAGNOSIS — D62 Acute posthemorrhagic anemia: Secondary | ICD-10-CM | POA: Diagnosis present

## 2020-11-11 DIAGNOSIS — R778 Other specified abnormalities of plasma proteins: Secondary | ICD-10-CM | POA: Diagnosis not present

## 2020-11-11 DIAGNOSIS — J449 Chronic obstructive pulmonary disease, unspecified: Secondary | ICD-10-CM | POA: Diagnosis present

## 2020-11-11 DIAGNOSIS — Z8711 Personal history of peptic ulcer disease: Secondary | ICD-10-CM

## 2020-11-11 DIAGNOSIS — N400 Enlarged prostate without lower urinary tract symptoms: Secondary | ICD-10-CM | POA: Diagnosis present

## 2020-11-11 DIAGNOSIS — W19XXXA Unspecified fall, initial encounter: Secondary | ICD-10-CM | POA: Diagnosis present

## 2020-11-11 DIAGNOSIS — D631 Anemia in chronic kidney disease: Secondary | ICD-10-CM | POA: Diagnosis present

## 2020-11-11 DIAGNOSIS — F32A Depression, unspecified: Secondary | ICD-10-CM | POA: Diagnosis present

## 2020-11-11 DIAGNOSIS — Z992 Dependence on renal dialysis: Secondary | ICD-10-CM

## 2020-11-11 DIAGNOSIS — A0472 Enterocolitis due to Clostridium difficile, not specified as recurrent: Secondary | ICD-10-CM | POA: Diagnosis present

## 2020-11-11 DIAGNOSIS — Z885 Allergy status to narcotic agent status: Secondary | ICD-10-CM

## 2020-11-11 DIAGNOSIS — Z20822 Contact with and (suspected) exposure to covid-19: Secondary | ICD-10-CM | POA: Diagnosis present

## 2020-11-11 DIAGNOSIS — Z7982 Long term (current) use of aspirin: Secondary | ICD-10-CM

## 2020-11-11 DIAGNOSIS — I5022 Chronic systolic (congestive) heart failure: Secondary | ICD-10-CM | POA: Diagnosis not present

## 2020-11-11 DIAGNOSIS — I132 Hypertensive heart and chronic kidney disease with heart failure and with stage 5 chronic kidney disease, or end stage renal disease: Secondary | ICD-10-CM | POA: Diagnosis present

## 2020-11-11 DIAGNOSIS — I10 Essential (primary) hypertension: Secondary | ICD-10-CM | POA: Diagnosis not present

## 2020-11-11 DIAGNOSIS — K219 Gastro-esophageal reflux disease without esophagitis: Secondary | ICD-10-CM | POA: Diagnosis present

## 2020-11-11 DIAGNOSIS — K922 Gastrointestinal hemorrhage, unspecified: Secondary | ICD-10-CM | POA: Diagnosis present

## 2020-11-11 DIAGNOSIS — Z79899 Other long term (current) drug therapy: Secondary | ICD-10-CM

## 2020-11-11 DIAGNOSIS — R7989 Other specified abnormal findings of blood chemistry: Secondary | ICD-10-CM | POA: Diagnosis present

## 2020-11-11 LAB — APTT: aPTT: 33 seconds (ref 24–36)

## 2020-11-11 LAB — CBC
HCT: 24.3 % — ABNORMAL LOW (ref 39.0–52.0)
HCT: 21.4 % — ABNORMAL LOW (ref 39.0–52.0)
HCT: 24.3 % — ABNORMAL LOW (ref 39.0–52.0)
Hemoglobin: 7.9 g/dL — ABNORMAL LOW (ref 13.0–17.0)
Hemoglobin: 7.1 g/dL — ABNORMAL LOW (ref 13.0–17.0)
Hemoglobin: 8.1 g/dL — ABNORMAL LOW (ref 13.0–17.0)
MCH: 33.2 pg (ref 26.0–34.0)
MCH: 33.5 pg (ref 26.0–34.0)
MCH: 33.3 pg (ref 26.0–34.0)
MCHC: 32.5 g/dL (ref 30.0–36.0)
MCHC: 33.2 g/dL (ref 30.0–36.0)
MCHC: 33.3 g/dL (ref 30.0–36.0)
MCV: 102.1 fL — ABNORMAL HIGH (ref 80.0–100.0)
MCV: 100.9 fL — ABNORMAL HIGH (ref 80.0–100.0)
MCV: 100 fL (ref 80.0–100.0)
Platelets: 78 10*3/uL — ABNORMAL LOW (ref 150–400)
Platelets: 75 10*3/uL — ABNORMAL LOW (ref 150–400)
Platelets: 79 10*3/uL — ABNORMAL LOW (ref 150–400)
RBC: 2.38 MIL/uL — ABNORMAL LOW (ref 4.22–5.81)
RBC: 2.12 MIL/uL — ABNORMAL LOW (ref 4.22–5.81)
RBC: 2.43 MIL/uL — ABNORMAL LOW (ref 4.22–5.81)
RDW: 18.3 % — ABNORMAL HIGH (ref 11.5–15.5)
RDW: 18.6 % — ABNORMAL HIGH (ref 11.5–15.5)
RDW: 17.8 % — ABNORMAL HIGH (ref 11.5–15.5)
WBC: 6.2 10*3/uL (ref 4.0–10.5)
WBC: 5.5 10*3/uL (ref 4.0–10.5)
WBC: 6 10*3/uL (ref 4.0–10.5)
nRBC: 0 % (ref 0.0–0.2)
nRBC: 0 % (ref 0.0–0.2)
nRBC: 0 % (ref 0.0–0.2)

## 2020-11-11 LAB — COMPREHENSIVE METABOLIC PANEL
ALT: 22 U/L (ref 0–44)
AST: 46 U/L — ABNORMAL HIGH (ref 15–41)
Albumin: 2.9 g/dL — ABNORMAL LOW (ref 3.5–5.0)
Alkaline Phosphatase: 161 U/L — ABNORMAL HIGH (ref 38–126)
Anion gap: 13 (ref 5–15)
BUN: 31 mg/dL — ABNORMAL HIGH (ref 8–23)
CO2: 29 mmol/L (ref 22–32)
Calcium: 8.2 mg/dL — ABNORMAL LOW (ref 8.9–10.3)
Chloride: 93 mmol/L — ABNORMAL LOW (ref 98–111)
Creatinine, Ser: 2.35 mg/dL — ABNORMAL HIGH (ref 0.61–1.24)
GFR, Estimated: 27 mL/min — ABNORMAL LOW (ref >60)
Glucose, Bld: 86 mg/dL (ref 70–99)
Potassium: 3.5 mmol/L (ref 3.5–5.1)
Sodium: 135 mmol/L (ref 135–145)
Total Bilirubin: 0.9 mg/dL (ref 0.3–1.2)
Total Protein: 6.1 g/dL — ABNORMAL LOW (ref 6.5–8.1)

## 2020-11-11 LAB — PREPARE RBC (CROSSMATCH)

## 2020-11-11 LAB — IRON AND TIBC
Iron: 75 ug/dL (ref 45–182)
Saturation Ratios: 29 % (ref 17.9–39.5)
TIBC: 258 ug/dL (ref 250–450)
UIBC: 183 ug/dL

## 2020-11-11 LAB — TROPONIN I (HIGH SENSITIVITY)
Troponin I (High Sensitivity): 27 ng/L — ABNORMAL HIGH (ref <18)
Troponin I (High Sensitivity): 26 ng/L — ABNORMAL HIGH (ref <18)
Troponin I (High Sensitivity): 26 ng/L — ABNORMAL HIGH (ref <18)
Troponin I (High Sensitivity): 28 ng/L — ABNORMAL HIGH (ref <18)

## 2020-11-11 LAB — PROTIME-INR
INR: 1.4 — ABNORMAL HIGH (ref 0.8–1.2)
Prothrombin Time: 16.9 seconds — ABNORMAL HIGH (ref 11.4–15.2)

## 2020-11-11 LAB — RESP PANEL BY RT-PCR (FLU A&B, COVID) ARPGX2
Influenza A by PCR: NEGATIVE
Influenza B by PCR: NEGATIVE
SARS Coronavirus 2 by RT PCR: NEGATIVE

## 2020-11-11 LAB — FERRITIN: Ferritin: 437 ng/mL — ABNORMAL HIGH (ref 24–336)

## 2020-11-11 MED ORDER — MIDODRINE HCL 5 MG PO TABS
10.0000 mg | ORAL_TABLET | Freq: Three times a day (TID) | ORAL | Status: DC
  Administered 2020-11-11 – 2020-11-15 (×11): 10 mg via ORAL
  Filled 2020-11-11 (×11): qty 2

## 2020-11-11 MED ORDER — MELATONIN 5 MG PO TABS
10.0000 mg | ORAL_TABLET | Freq: Every evening | ORAL | Status: DC | PRN
  Administered 2020-11-11: 10 mg via ORAL
  Filled 2020-11-11: qty 2

## 2020-11-11 MED ORDER — PANTOPRAZOLE INFUSION (NEW) - SIMPLE MED
8.0000 mg/h | INTRAVENOUS | Status: DC
  Administered 2020-11-11: 8 mg/h via INTRAVENOUS
  Filled 2020-11-11 (×2): qty 100

## 2020-11-11 MED ORDER — PANTOPRAZOLE SODIUM 40 MG IV SOLR
40.0000 mg | Freq: Two times a day (BID) | INTRAVENOUS | Status: DC
  Administered 2020-11-11 – 2020-11-15 (×8): 40 mg via INTRAVENOUS
  Filled 2020-11-11 (×8): qty 40

## 2020-11-11 MED ORDER — ACETAMINOPHEN 325 MG PO TABS: 650.0000 mg | ORAL_TABLET | Freq: Four times a day (QID) | ORAL | Status: DC | PRN

## 2020-11-11 MED ORDER — ESCITALOPRAM OXALATE 20 MG PO TABS
20.0000 mg | ORAL_TABLET | Freq: Every day | ORAL | Status: DC
  Administered 2020-11-11 – 2020-11-15 (×5): 20 mg via ORAL
  Filled 2020-11-11: qty 2
  Filled 2020-11-11: qty 1
  Filled 2020-11-11: qty 2
  Filled 2020-11-11 (×2): qty 1

## 2020-11-11 MED ORDER — ATORVASTATIN CALCIUM 20 MG PO TABS
80.0000 mg | ORAL_TABLET | Freq: Every day | ORAL | Status: DC
  Administered 2020-11-11 – 2020-11-15 (×5): 80 mg via ORAL
  Filled 2020-11-11 (×5): qty 4

## 2020-11-11 MED ORDER — LORATADINE 10 MG PO TABS
10.0000 mg | ORAL_TABLET | Freq: Every day | ORAL | Status: DC
  Administered 2020-11-11 – 2020-11-15 (×5): 10 mg via ORAL
  Filled 2020-11-11 (×5): qty 1

## 2020-11-11 MED ORDER — CALCIUM ACETATE (PHOS BINDER) 667 MG PO CAPS
1334.0000 mg | ORAL_CAPSULE | Freq: Three times a day (TID) | ORAL | Status: DC
  Administered 2020-11-12 – 2020-11-15 (×11): 1334 mg via ORAL
  Filled 2020-11-11 (×12): qty 2

## 2020-11-11 MED ORDER — ROPINIROLE HCL 1 MG PO TABS
1.5000 mg | ORAL_TABLET | Freq: Every day | ORAL | Status: DC
  Administered 2020-11-11 – 2020-11-14 (×4): 1.5 mg via ORAL
  Filled 2020-11-11 (×5): qty 2

## 2020-11-11 MED ORDER — FLUTICASONE PROPIONATE 50 MCG/ACT NA SUSP
2.0000 | Freq: Every day | NASAL | Status: DC | PRN
  Filled 2020-11-11: qty 16

## 2020-11-11 MED ORDER — ONDANSETRON HCL 4 MG/2ML IJ SOLN: 4.0000 mg | Freq: Three times a day (TID) | INTRAMUSCULAR | Status: DC | PRN

## 2020-11-11 MED ORDER — PANTOPRAZOLE 80MG IVPB - SIMPLE MED
80.0000 mg | Freq: Once | INTRAVENOUS | Status: AC
  Administered 2020-11-11: 80 mg via INTRAVENOUS
  Filled 2020-11-11: qty 80

## 2020-11-11 MED ORDER — SODIUM CHLORIDE 0.9 % IV SOLN
10.0000 mL/h | Freq: Once | INTRAVENOUS | Status: AC
  Administered 2020-11-11: 10 mL/h via INTRAVENOUS

## 2020-11-11 MED ORDER — RENA-VITE PO TABS
1.0000 | ORAL_TABLET | Freq: Every day | ORAL | Status: DC
  Administered 2020-11-11 – 2020-11-14 (×4): 1 via ORAL
  Filled 2020-11-11 (×5): qty 1

## 2020-11-11 NOTE — ED Notes (Addendum)
Son-in-law at bedside states patient will take a laxative one day and immodium the next day to manage bowels.

## 2020-11-11 NOTE — H&P (Signed)
History and Physical    Brent Glover [id number] DOB: Jul 05, 1935 DOA: 11/11/2020  Referring MD/NP/PA:   PCP: Christen Bame, DO   Patient coming from:  The patient is coming from home.       Chief Complaint: diarrhea with melena  HPI: Brent Glover is a 85 y.o. male with medical history significant of ESRD-HD (TTS), hypertension, hyperlipidemia, diet-controlled diabetes, COPD, GERD, depression, sCHF with EF 35-40%, CAD, CABG, anemia, GI bleeding, thrombocytopenia, who presents with diarrhea with melena.  Patient has been having chronic diarrhea for several weeks which just started to become darker and and more black. He has 5 times of watery bowel movements each day.  Denies nausea vomiting or abdominal pain. He recently had dental procedure and received prophylactic antibiotics at that time. No fevers, but has chills. Patient has shortness of breath on exertion, no chest pain or cough.  Patient has generalized weakness.  Per report, patient had a dialysis today. After dialysis, patient had hypotension with SBP 70s and bradycardia with heart rate 30-50s.  His blood pressure is 90/30 in the ED.   ED Course: pt was found to have hemoglobin 7.9 --> 7.1 (9.7 on 07/10/2020), troponin level 27, 26, WBC 6.2, negative COVID PCR, potassium 3.5, bicarbonate 29, creatinine 2.35, BUN 31.  Temperature normal, blood pressure 90/39, heart rate 53, RR 18, oxygen saturation 100% on room air.  Chest x-ray showed vascular congestion and cardiomegaly.  Patient is admitted to [facility] as inpatient.  Dr. Haig Prophet of GI and Dr. Juleen China of renal are consulted.  Review of Systems:   General: no fevers, chills, no body weight gain, has fatigue HEENT: no blurry vision, hearing changes or sore throat Respiratory: has dyspnea, no coughing, wheezing CV: no chest pain, no palpitations GI: no nausea, vomiting, abdominal pain, has diarrhea and melena,  no constipation GU: no dysuria, burning on  urination, increased urinary frequency, hematuria  Ext: has trace leg edema Neuro: no unilateral weakness, numbness, or tingling, no vision change or hearing loss Skin: no rash, no skin tear. MSK: No muscle spasm, no deformity, no limitation of range of movement in spin Heme: No easy bruising.  Travel history: No recent long distant travel.  Allergy:  Allergies  Allergen Reactions   Montelukast Shortness Of Breath   Trazodone Other (See Comments)    "it hypes me up instead of putting me to sleep"   Hydrocodone Nausea And Vomiting    Past Medical History:  Diagnosis Date   BPH (benign prostatic hyperplasia)    Chronic diastolic heart failure ([name])    COPD (chronic obstructive pulmonary disease) ([name])    Diabetes mellitus without complication ([name])    ESRD (end stage renal disease) ([place])    stage III   Hyperlipidemia    Hypertension     Past Surgical History:  Procedure Laterality Date   ABDOMINAL AORTIC ENDOVASCULAR STENT GRAFT     CYSTOSCOPY     KNEE ARTHROPLASTY      Social History:  reports that he quit smoking about 6 years ago. His smoking use included cigarettes. He has a 60.00 pack-year smoking history. He has never used smokeless tobacco. He reports that he does not drink alcohol and does not use drugs.  Family History:  Family History  Problem Relation Age of Onset   Diabetes Mellitus II Sister    Diabetes type II Brother      Prior to Admission medications   Medication Sig Start Date End Date Taking?  Authorizing Provider  acetaminophen (TYLENOL) 500 MG tablet Take 500 mg by mouth every 6 (six) hours as needed for mild pain.    [provider]  amLODipine (NORVASC) 10 MG tablet Take 0.5 tablets (5 mg total) by mouth daily. Patient not taking: Reported on 09/29/2017 06/25/12   Wellington Hampshire, MD  aspirin 81 MG tablet Take 81 mg by mouth daily.    [provider]  atorvastatin (LIPITOR) 80 MG tablet Take 80 mg by mouth daily. 08/17/17    [provider]  carvedilol (COREG) 6.25 MG tablet Take 6.25 mg by mouth 2 (two) times daily with a meal.    [provider]  ergocalciferol (VITAMIN D2) 50000 units capsule Take 50,000 Units by mouth every Friday.    [provider]  escitalopram (LEXAPRO) 10 MG tablet Take 20 mg by mouth daily.  12/21/14 09/30/18  [provider]  ferrous sulfate 325 (65 FE) MG tablet Take 325 mg by mouth daily with breakfast.    [provider]  fluticasone (FLONASE) 50 MCG/ACT nasal spray Place 2 sprays into both nostrils daily as needed for allergies. 07/03/17   [provider]  furosemide (LASIX) 20 MG tablet Take 1 tablet (20 mg total) by mouth daily. Patient not taking: Reported on 09/29/2017 06/27/12   Wellington Hampshire, MD  Melatonin 5 MG TABS Take 5 mg by mouth at bedtime.    [provider]  midodrine (PROAMATINE) 5 MG tablet Take 5-10 mg by mouth Every Tuesday,Thursday,and Saturday with dialysis. Give 10 mg by mouth 30 minutes before dialysis and 5 mg by mouth half way through dialysis.    [provider]  pantoprazole (PROTONIX) 40 MG tablet Take 40 mg by mouth daily.    [provider]  rOPINIRole (REQUIP) 0.25 MG tablet Take 0.5 mg by mouth at bedtime. Reported on 02/10/2015    [provider]  tamsulosin (FLOMAX) 0.4 MG CAPS Take 0.4 mg by mouth daily.    [provider]  Vitamin D, Ergocalciferol, (DRISDOL) 50000 units CAPS capsule Take 1 capsule (50,000 Units total) by mouth every Friday. 10/05/17   [name], MD    Physical Exam: Vitals:   11/11/20 1355 11/11/20 1358 11/11/20 1614 11/11/20 1643  BP: (!) 90/39  (!) 91/51 (!) 110/45  Pulse: (!) 53  63 (!) 54  Resp: '18  18 18  '$ Temp: 98 F (36.7 C)  98 F (36.7 C) 98.8 F (37.1 C)  TempSrc: Oral  Oral Oral  SpO2: 100%  100% 99%  Weight:  74.4 kg    Height:  '5\' 7"'$  (1.702 m)     General: Not in acute distress. Pale looking. HEENT:       Eyes:  PERRL, EOMI, no scleral icterus.       ENT: No discharge from the ears and nose, no pharynx injection, no tonsillar enlargement.        Neck: No JVD, no bruit, no mass felt. Heme: No neck lymph node enlargement. Cardiac: S1/S2, RRR, No murmurs, No gallops or rubs. Respiratory: No rales, wheezing, rhonchi or rubs. GI: Soft, nondistended, nontender, no rebound pain, no organomegaly, BS present. GU: No hematuria Ext: has trace leg edema bilaterally. 1+DP/PT pulse bilaterally. Musculoskeletal: No joint deformities, No joint redness or warmth, no limitation of ROM in spin. Skin: No rashes.  Neuro: Alert, oriented X3, cranial nerves II-XII grossly intact, moves all extremities normally.  Psych: Patient is not psychotic, no suicidal or hemocidal ideation.  Labs  on Admission: I have personally reviewed following labs and imaging studies  CBC: Recent Labs  Lab 11/11/20 1351 11/11/20 1539  WBC 6.2 5.5  HGB 7.9* 7.1*  HCT 24.3* 21.4*  MCV 102.1* 100.9*  PLT 78* 75*   Basic Metabolic Panel: Recent Labs  Lab 11/11/20 1351  NA 135  K 3.5  CL 93*  CO2 29  GLUCOSE 86  BUN 31*  CREATININE 2.35*  CALCIUM 8.2*   GFR: Estimated Creatinine Clearance: 22.7 mL/min (A) (by C-G formula based on SCr of 2.35 mg/dL (H)). Liver Function Tests: Recent Labs  Lab 11/11/20 1351  AST 46*  ALT 22  ALKPHOS 161*  BILITOT 0.9  PROT 6.1*  ALBUMIN 2.9*   No results for input(s): LIPASE, AMYLASE in the last 168 hours. No results for input(s): AMMONIA in the last 168 hours. Coagulation Profile: Recent Labs  Lab 11/11/20 1621  INR 1.4*   Cardiac Enzymes: No results for input(s): CKTOTAL, CKMB, CKMBINDEX, TROPONINI in the last 168 hours. BNP (last 3 results) No results for input(s): PROBNP in the last 8760 hours. HbA1C: No results for input(s): HGBA1C in the last 72 hours. CBG: No results for input(s): GLUCAP in the last 168 hours. Lipid Profile: No results for input(s): CHOL, HDL, LDLCALC,  TRIG, CHOLHDL, LDLDIRECT in the last 72 hours. Thyroid Function Tests: No results for input(s): TSH, T4TOTAL, FREET4, T3FREE, THYROIDAB in the last 72 hours. Anemia Panel: Recent Labs    11/11/20 1351 11/11/20 1539  FERRITIN  --  437*  TIBC 258  --   IRON 75  --    Urine analysis:    Component Value Date/Time   COLORURINE STRAW (A) 10/01/2017 0710   APPEARANCEUR CLEAR (A) 10/01/2017 0710   LABSPEC 1.006 10/01/2017 0710   PHURINE 8.0 10/01/2017 0710   GLUCOSEU NEGATIVE 10/01/2017 0710   HGBUR NEGATIVE 10/01/2017 0710   BILIRUBINUR NEGATIVE 10/01/2017 0710   KETONESUR NEGATIVE 10/01/2017 0710   PROTEINUR 100 (A) 10/01/2017 0710   NITRITE NEGATIVE 10/01/2017 0710   LEUKOCYTESUR NEGATIVE 10/01/2017 0710   Sepsis Labs: '@LABRCNTIP'$ (procalcitonin:4,lacticidven:4) ) Recent Results (from the past 240 hour(s))  Resp Panel by RT-PCR (Flu A&B, Covid) Nasopharyngeal Swab     Status: None   Collection Time: 11/11/20  1:45 PM   Specimen: Nasopharyngeal Swab; Nasopharyngeal(NP) swabs in vial transport medium  Result Value Ref Range Status   SARS Coronavirus 2 by RT PCR NEGATIVE NEGATIVE Final    Comment: (NOTE) SARS-CoV-2 target nucleic acids are NOT DETECTED.  The SARS-CoV-2 RNA is generally detectable in upper respiratory specimens during the acute phase of infection. The lowest concentration of SARS-CoV-2 viral copies this assay can detect is 138 copies/mL. A negative result does not preclude SARS-Cov-2 infection and should not be used as the sole basis for treatment or other patient management decisions. A negative result may occur with  improper specimen collection/handling, submission of specimen other than nasopharyngeal swab, presence of viral mutation(s) within the areas targeted by this assay, and inadequate number of viral copies(<138 copies/mL). A negative result must be combined with clinical observations, patient history, and epidemiological information. The expected  result is Negative.  Fact Sheet for Patients:  EntrepreneurPulse.com.au  Fact Sheet for Healthcare Providers:  IncredibleEmployment.be  This test is no t yet approved or cleared by the [place] FDA and  has been authorized for detection and/or diagnosis of SARS-CoV-2 by FDA under an Emergency Use Authorization (EUA). This EUA will remain  in effect (meaning this test can be  used) for the duration of the COVID-19 declaration under Section 564(b)(1) of the Act, 21 U.S.C.section 360bbb-3(b)(1), unless the authorization is terminated  or revoked sooner.       Influenza A by PCR NEGATIVE NEGATIVE Final   Influenza B by PCR NEGATIVE NEGATIVE Final    Comment: (NOTE) The Xpert Xpress SARS-CoV-2/FLU/RSV plus assay is intended as an aid in the diagnosis of influenza from Nasopharyngeal swab specimens and should not be used as a sole basis for treatment. Nasal washings and aspirates are unacceptable for Xpert Xpress SARS-CoV-2/FLU/RSV testing.  Fact Sheet for Patients: EntrepreneurPulse.com.au  Fact Sheet for Healthcare Providers: IncredibleEmployment.be  This test is not yet approved or cleared by the [place] FDA and has been authorized for detection and/or diagnosis of SARS-CoV-2 by FDA under an Emergency Use Authorization (EUA). This EUA will remain in effect (meaning this test can be used) for the duration of the COVID-19 declaration under Section 564(b)(1) of the Act, 21 U.S.C. section 360bbb-3(b)(1), unless the authorization is terminated or revoked.  Performed at [facility], [address] [place], [place] [address]      Radiological Exams on Admission: DG Chest Portable 1 View  Result Date: 11/11/2020 CLINICAL DATA:  Shortness of breath and weakness. EXAM: PORTABLE CHEST 1 VIEW COMPARISON:  07/10/2020 FINDINGS: Previous median sternotomy and CABG. Chronic cardiomegaly and  aortic atherosclerosis. Pulmonary venous hypertension with mild interstitial edema. No effusions. No focal pulmonary finding. IMPRESSION: Congestive heart failure. Cardiomegaly. Pulmonary venous hypertension. Mild interstitial edema. Electronically Signed   By: Nelson Chimes M.D.   On: 11/11/2020 14:33     EKG: I have personally reviewed.  Not done in ED, will get one.   Assessment/Plan Principal Problem:   GI bleeding Active Problems:   Hypertension   Hypercholesterolemia   Anemia due to blood loss   ESRD (end stage renal disease) ([name])   Chronic systolic CHF (congestive heart failure) ([name])   Anemia in ESRD (end-stage renal disease) ([name])   CAD (coronary artery disease)   Elevated troponin   Hypotension   Bradycardia   Thrombocytopenia ([name])   Depression   GI bleeding and anemia due to blood loss and hx of  anemia in ESRD: Hgb 9.7 --> 7.9 --> 7.1. Dr. Haig Prophet of GI is consulted. - will admitted to med-surg bed as inpatient - transfuse 1 unit of blood now - Start IV pantoprazole 40 mg bid - Zofran IV for nausea - Avoid NSAIDs and SQ heparin - Maintain IV access (2 large bore IVs if possible). - Monitor closely and follow q6h cbc, transfuse as necessary, if Hgb<7.0 - LaB: INR, PTT and type screen -Check C. difficile  Hypotension: Blood pressure was down to 70s, likely multifactorial etiology, including fluid removal after dialysis and blood loss.  Patient does not have signs of infection, does not seem to have sepsis. -Hold all blood pressure medications -also hold Flomax -Midodrine 10 mg 3 times daily  Hypertension: -Hold all Bp med due to hypotension  Hypercholesterolemia -Lipitor  ESRD (end stage renal disease) (TTS) -Consulted Dr. Juleen China  Chronic systolic CHF (congestive heart failure) [facility]): Patient has trace leg edema, no respiratory distress, does not seem to have CHF exacerbation.  2D echo on 09/30/2017 showed EF of 35-40% with grade 1 diastolic  dysfunction. -Volume management per renal by dialysis  CAD (coronary artery disease) and Elevated troponin: Troponin is minimally elevated 27, 26.  No chest pain, likely due to nonspecific elevation or decreased clearance in the setting of  ESRD -Trend troponin -Hold aspirin due to GI bleeding -Continue Lipitor -Check A1c, FLP  Bradycardia: Heart rate was down to 30-50s per report.  Currently heart rate is 53, etiology is not clear -Hold metoprolol  Thrombocytopenia ([place]): This is chronic issue.  Platelet 78 and -Follow-up with CBC  Depression: -Continue home medications   DVT ppx: SCD Code Status: Full code Family Communication: I have tried to call her wife, who did not pick up the phone.  I left a message to her. Disposition Plan:  Anticipate discharge back to previous environment Consults called:  Dr. Haig Prophet of GI and Dr. Juleen China of renal are consulted. Admission status and Level of care: Med-Surg:   as inpt        Status is: Inpatient  Remains inpatient appropriate because:Inpatient level of care appropriate due to severity of illness  Dispo: The patient is from: Home              Anticipated d/c is to: Home              Patient currently is not medically stable to d/c.   Difficult to place patient No           Date of Service 11/11/2020    Ivor Costa [facility]   If 7PM-7AM, please contact night-coverage [email or web address] 11/11/2020, 6:18 PM

## 2020-11-11 NOTE — ED Notes (Signed)
Report given to Sarah RN

## 2020-11-11 NOTE — ED Provider Notes (Addendum)
[facility] Emergency Department Provider Note  Time seen: 1:45 PM  I have reviewed the triage vital signs and the nursing notes.   HISTORY  Chief Complaint Hypotension and GI Bleeding   HPI Brent Glover is a 85 y.o. male with a past medical history of CHF, COPD, diabetes, ESRD on HD Tuesday/Thursday/Saturday, hypertension, hyperlipidemia, presents to the emergency department for generalized weakness.  According to EMS patient is coming from home for generalized weakness, shortness of breath with exertion.  Patient states he has been experiencing loose stool over the past several days.  EMS states the patient had stool on him when they arrived which was very dark in color.  Patient states a history of a prior GI bleed many years ago.  Patient does receive heparin at his dialysis appointments but no other anticoagulation per patient.  Patient did have dialysis this morning.   Past Medical History:  Diagnosis Date   BPH (benign prostatic hyperplasia)    Chronic diastolic heart failure ([name])    COPD (chronic obstructive pulmonary disease) ([name])    Diabetes mellitus without complication ([name])    ESRD (end stage renal disease) ([place])    stage III   Hyperlipidemia    Hypertension     Patient Active Problem List   Diagnosis Date Noted   Anemia    Acute on chronic anemia 09/30/2017   Syncope and collapse 09/29/2017   Chronic kidney disease    CAD in native artery 11/23/2014   H/O coronary artery bypass surgery 10/28/2014   Acute non-ST elevation myocardial infarction (NSTEMI) ([place]) 10/15/2014   Chronic kidney disease (CKD), stage III (moderate) ([name]) 03/17/2014   Creatinine elevation 03/11/2014   Chronic combined systolic and diastolic heart failure ([place]) 11/29/2012   Claudication ([place]) 06/25/2012   Chronic diastolic heart failure ([place])    Hypertension    Type 2 diabetes mellitus ([place]) 09/21/2011   Aneurysm ([place]) 10/07/2010   Arthritis, degenerative  09/09/2010   Hypercholesterolemia 04/04/2009   Benign hypertension 09/04/2008    Past Surgical History:  Procedure Laterality Date   ABDOMINAL AORTIC ENDOVASCULAR STENT GRAFT     CYSTOSCOPY     KNEE ARTHROPLASTY      Prior to Admission medications   Medication Sig Start Date End Date Taking? Authorizing Provider  acetaminophen (TYLENOL) 500 MG tablet Take 500 mg by mouth every 6 (six) hours as needed for mild pain.    [provider]  amLODipine (NORVASC) 10 MG tablet Take 0.5 tablets (5 mg total) by mouth daily. Patient not taking: Reported on 09/29/2017 06/25/12   Wellington Hampshire, MD  aspirin 81 MG tablet Take 81 mg by mouth daily.    [provider]  atorvastatin (LIPITOR) 80 MG tablet Take 80 mg by mouth daily. 08/17/17   [provider]  carvedilol (COREG) 6.25 MG tablet Take 6.25 mg by mouth 2 (two) times daily with a meal.    [provider]  ergocalciferol (VITAMIN D2) 50000 units capsule Take 50,000 Units by mouth every Friday.    [provider]  escitalopram (LEXAPRO) 10 MG tablet Take 20 mg by mouth daily.  12/21/14 09/30/18  [provider]  ferrous sulfate 325 (65 FE) MG tablet Take 325 mg by mouth daily with breakfast.    [provider]  fluticasone (FLONASE) 50 MCG/ACT nasal spray Place 2 sprays into both nostrils daily as needed for allergies. 07/03/17   [provider]  furosemide (LASIX) 20 MG tablet Take 1  tablet (20 mg total) by mouth daily. Patient not taking: Reported on 09/29/2017 06/27/12   Wellington Hampshire, MD  Melatonin 5 MG TABS Take 5 mg by mouth at bedtime.    [provider]  midodrine (PROAMATINE) 5 MG tablet Take 5-10 mg by mouth Every Tuesday,Thursday,and Saturday with dialysis. Give 10 mg by mouth 30 minutes before dialysis and 5 mg by mouth half way through dialysis.    [provider]  pantoprazole (PROTONIX) 40 MG tablet Take 40 mg by mouth daily.    [provider]  rOPINIRole (REQUIP) 0.25 MG tablet Take 0.5 mg by mouth at bedtime. Reported on 02/10/2015    [provider]  tamsulosin (FLOMAX) 0.4 MG CAPS Take 0.4 mg by mouth daily.    [provider]  Vitamin D, Ergocalciferol, (DRISDOL) 50000 units CAPS capsule Take 1 capsule (50,000 Units total) by mouth every Friday. 10/05/17   [name], MD    Allergies  Allergen Reactions   Montelukast Shortness Of Breath   Trazodone Other (See Comments)    "it hypes me up instead of putting me to sleep"   Hydrocodone Nausea And Vomiting    No family history on file.  Social History Social History   Tobacco Use   Smoking status: Former    Packs/day: 1.00    Years: 60.00    Pack years: 60.00    Types: Cigarettes    Quit date: 10/11/2014    Years since quitting: 6.0   Smokeless tobacco: Never  Substance Use Topics   Alcohol use: No   Drug use: No    Review of Systems Constitutional: Negative for fever.  Positive for generalized weakness Cardiovascular: Negative for chest pain. Respiratory: Shortness of breath with exertion Gastrointestinal: Negative for abdominal pain, vomiting.  Positive for loose stool.  Dark in color per EMS. Musculoskeletal: Negative for musculoskeletal complaints Neurological: Negative for headache All other ROS negative  ____________________________________________   PHYSICAL EXAM:  Constitutional: Alert and oriented. Well appearing and in no distress.  Somewhat pale appearing. Eyes: Normal exam ENT      Head: Normocephalic and atraumatic.      Mouth/Throat: Mucous membranes are moist. Cardiovascular: Normal rate, regular rhythm.  Respiratory: Normal respiratory effort without tachypnea nor retractions. Breath sounds are clear  Gastrointestinal: Soft and nontender. No distention.  Musculoskeletal: Nontender with normal range of motion in all extremities.  Neurologic:  Normal speech and language. No gross focal neurologic  deficits Skin:  Skin is warm, dry and intact.  Somewhat pale in appearance. Psychiatric: Mood and affect are normal. Speech and behavior are normal.   ____________________________________________    EKG  EKG viewed and interpreted by myself shows what appears to be a sinus rhythm at 52 bpm with a borderline widened QRS, normal axis, normal intervals with no concerning ST changes.  ____________________________________________    RADIOLOGY  Chest x-ray showing congestive heart disease.  ____________________________________________   INITIAL IMPRESSION / ASSESSMENT AND PLAN / ED COURSE  Pertinent labs & imaging results that were available during my care of the patient were reviewed by me and considered in my medical decision making (see chart for details).   Patient presents to the emergency department for generalized fatigue weakness.  Per EMS patient was in bigeminy at times with a heart rate as low as 30 to 35 bpm.  Here patient appears to be in sinus bradycardia around 50 to 60 bpm.  Patient is somewhat pale in appearance.  Rectal examination shows  melena strongly guaiac positive.  We will check labs, type and screen.  Patient is agreeable to blood transfusion if needed.  Patient found to be hypotensive by EMS with a systolic ranging between 70 and 90.  We will dose 500 cc of normal saline.  Patient will require admission to the hospital service once his emergency department work-up is been completed.  We will start the patient on Protonix.  CBC shows hemoglobin of 7.9 however given active bleed and hypotension we will transfuse 1 unit of PRBCs in addition to 500 cc of normal saline.  I spoke to Dr. Haig Prophet of GI medicine who is aware of the patient.  We will admit to the hospital service for further work-up and treatment.  Brent Glover was evaluated in Emergency Department on 11/11/2020 for the symptoms described in the history of present illness. He was evaluated in the context of  the global COVID-19 pandemic, which necessitated consideration that the patient might be at risk for infection with the SARS-CoV-2 virus that causes COVID-19. Institutional protocols and algorithms that pertain to the evaluation of patients at risk for COVID-19 are in a state of rapid change based on information released by regulatory bodies including the CDC and federal and state organizations. These policies and algorithms were followed during the patient's care in the ED.  CRITICAL CARE Performed by: Harvest Dark   Total critical care time: 30 minutes  Critical care time was exclusive of separately billable procedures and treating other patients.  Critical care was necessary to treat or prevent imminent or life-threatening deterioration.  Critical care was time spent personally by me on the following activities: development of treatment plan with patient and/or surrogate as well as nursing, discussions with consultants, evaluation of patient's response to treatment, examination of patient, obtaining history from patient or surrogate, ordering and performing treatments and interventions, ordering and review of laboratory studies, ordering and review of radiographic studies, pulse oximetry and re-evaluation of patient's condition.  ____________________________________________   FINAL CLINICAL IMPRESSION(S) / ED DIAGNOSES  GI bleed Hypotension Weakness   Harvest Dark, MD 11/11/20 1457    Harvest Dark, MD 11/11/20 1458

## 2020-11-11 NOTE — ED Notes (Signed)
Patient repositioned on stretcher. Patient remains calm, on cell phone.

## 2020-11-11 NOTE — Consult Note (Signed)
Consultation  Referring Provider: ED Admit date: 10/6 Consult date: 10/6         Reason for Consultation: Melena             HPI:   Brent Glover is a 85 y.o. gentleman with history of HFrEF (EF 40%) with NYHA Class III symptoms at baseline, COPD, and ESRD on hemodialysis here for acute on chronic anemia (7, baseline is 8-9) with black diarrhea. Patient has been having chronic diarrhea for several weeks which just started to become more black. He denies any NSAIDS or blood thinners besides heparin with dialysis. Had EGD/Colonoscopy in 2018 with small AVM but colon otherwise  normal. He underwent a dental procedure a few weeks ago and received prophylactic antibiotics at that time. He has been having 5 watery bowel movements daily. No fevers but has been having chills. He has been extremely weak. He has been having more frequent falls. He presented to the ED in June with anemia. He takes PPI daily. He lives at home with his wife.   Past Medical History:  Diagnosis Date  . BPH (benign prostatic hyperplasia)   . Chronic diastolic heart failure ([place])   . COPD (chronic obstructive pulmonary disease) ([place])   . Diabetes mellitus without complication ([place])   . ESRD (end stage renal disease) ([place])    stage III  . Hyperlipidemia   . Hypertension     Past Surgical History:  Procedure Laterality Date  . ABDOMINAL AORTIC ENDOVASCULAR STENT GRAFT    . CYSTOSCOPY    . KNEE ARTHROPLASTY      No family history on file. No significant family history  Social History   Tobacco Use  . Smoking status: Former    Packs/day: 1.00    Years: 60.00    Pack years: 60.00    Types: Cigarettes    Quit date: 10/11/2014    Years since quitting: 6.0  . Smokeless tobacco: Never  Substance Use Topics  . Alcohol use: No  . Drug use: No    Prior to Admission medications   Medication Sig Start Date End Date Taking? Authorizing Provider  acetaminophen (TYLENOL) 500 MG tablet Take 500 mg by mouth every 6  (six) hours as needed for mild pain.    [provider]  amLODipine (NORVASC) 10 MG tablet Take 0.5 tablets (5 mg total) by mouth daily. Patient not taking: Reported on 09/29/2017 06/25/12   Wellington Hampshire, MD  aspirin 81 MG tablet Take 81 mg by mouth daily.    [provider]  atorvastatin (LIPITOR) 80 MG tablet Take 80 mg by mouth daily. 08/17/17   [provider]  carvedilol (COREG) 6.25 MG tablet Take 6.25 mg by mouth 2 (two) times daily with a meal.    [provider]  ergocalciferol (VITAMIN D2) 50000 units capsule Take 50,000 Units by mouth every Friday.    [provider]  escitalopram (LEXAPRO) 10 MG tablet Take 20 mg by mouth daily.  12/21/14 09/30/18  [provider]  ferrous sulfate 325 (65 FE) MG tablet Take 325 mg by mouth daily with breakfast.    [provider]  fluticasone (FLONASE) 50 MCG/ACT nasal spray Place 2 sprays into both nostrils daily as needed for allergies. 07/03/17   [provider]  furosemide (LASIX) 20 MG tablet Take 1 tablet (20 mg total) by mouth daily. Patient not taking: Reported on 09/29/2017 06/27/12   Wellington Hampshire, MD  Melatonin 5 MG TABS Take 5  mg by mouth at bedtime.    [provider]  midodrine (PROAMATINE) 5 MG tablet Take 5-10 mg by mouth Every Tuesday,Thursday,and Saturday with dialysis. Give 10 mg by mouth 30 minutes before dialysis and 5 mg by mouth half way through dialysis.    [provider]  pantoprazole (PROTONIX) 40 MG tablet Take 40 mg by mouth daily.    [provider]  rOPINIRole (REQUIP) 0.25 MG tablet Take 0.5 mg by mouth at bedtime. Reported on 02/10/2015    [provider]  tamsulosin (FLOMAX) 0.4 MG CAPS Take 0.4 mg by mouth daily.    [provider]  Vitamin D, Ergocalciferol, (DRISDOL) 50000 units CAPS capsule Take 1 capsule (50,000 Units total) by mouth every Friday. 10/05/17   [name], MD    Current  Facility-Administered Medications  Medication Dose Route Frequency Provider Last Rate Last Admin  . 0.9 %  sodium chloride infusion  10 mL/hr Intravenous Once Harvest Dark, MD      . pantoprozole (PROTONIX) 80 mg /NS 100 mL infusion  8 mg/hr Intravenous Continuous Harvest Dark, MD 10 mL/hr at 11/11/20 1429 8 mg/hr at 11/11/20 1429   Current Outpatient Medications  Medication Sig Dispense Refill  . acetaminophen (TYLENOL) 500 MG tablet Take 500 mg by mouth every 6 (six) hours as needed for mild pain.    Marland Kitchen amLODipine (NORVASC) 10 MG tablet Take 0.5 tablets (5 mg total) by mouth daily. (Patient not taking: Reported on 09/29/2017)    . aspirin 81 MG tablet Take 81 mg by mouth daily.    Marland Kitchen atorvastatin (LIPITOR) 80 MG tablet Take 80 mg by mouth daily.    . carvedilol (COREG) 6.25 MG tablet Take 6.25 mg by mouth 2 (two) times daily with a meal.    . ergocalciferol (VITAMIN D2) 50000 units capsule Take 50,000 Units by mouth every Friday.    . escitalopram (LEXAPRO) 10 MG tablet Take 20 mg by mouth daily.     . ferrous sulfate 325 (65 FE) MG tablet Take 325 mg by mouth daily with breakfast.    . fluticasone (FLONASE) 50 MCG/ACT nasal spray Place 2 sprays into both nostrils daily as needed for allergies.    . furosemide (LASIX) 20 MG tablet Take 1 tablet (20 mg total) by mouth daily. (Patient not taking: Reported on 09/29/2017) 30 tablet 3  . Melatonin 5 MG TABS Take 5 mg by mouth at bedtime.    . midodrine (PROAMATINE) 5 MG tablet Take 5-10 mg by mouth Every Tuesday,Thursday,and Saturday with dialysis. Give 10 mg by mouth 30 minutes before dialysis and 5 mg by mouth half way through dialysis.    Marland Kitchen pantoprazole (PROTONIX) 40 MG tablet Take 40 mg by mouth daily.    Marland Kitchen rOPINIRole (REQUIP) 0.25 MG tablet Take 0.5 mg by mouth at bedtime. Reported on 02/10/2015    . tamsulosin (FLOMAX) 0.4 MG CAPS Take 0.4 mg by mouth daily.    . Vitamin D, Ergocalciferol, (DRISDOL) 50000 units CAPS capsule Take 1  capsule (50,000 Units total) by mouth every Friday. 5 capsule     Allergies as of 11/11/2020 - Review Complete 11/11/2020  Allergen Reaction Noted  . Montelukast Shortness Of Breath 02/10/2015  . Trazodone Other (See Comments) 02/10/2015  . Hydrocodone Nausea And Vomiting 02/10/2015     Review of Systems:    All systems reviewed and negative except where noted in HPI.  Review of Systems  Constitutional:  Positive for chills and malaise/fatigue. Negative for fever.  HENT:  Positive for hearing loss.   Respiratory:  Positive for shortness of breath.   Cardiovascular:  Negative for chest pain and leg swelling.  Gastrointestinal:  Positive for diarrhea and melena. Negative for abdominal pain, heartburn, nausea and vomiting.  Musculoskeletal:  Positive for joint pain.  Skin:  Negative for itching.  Neurological:  Negative for focal weakness.  Psychiatric/Behavioral:  Negative for substance abuse.   All other systems reviewed and are negative.     Physical Exam:  Vital signs in last 24 hours: Temp:  [98 F (36.7 C)] 98 F (36.7 C) (10/06 1355) Pulse Rate:  [53] 53 (10/06 1355) Resp:  [18] 18 (10/06 1355) BP: (90)/(39) 90/39 (10/06 1355) SpO2:  [100 %] 100 % (10/06 1355) Weight:  [74.4 kg] 74.4 kg (10/06 1358)   General:   Chronically ill appearing Head:  Normocephalic and atraumatic. Eyes:   No icterus.   Conjunctiva pink. Mouth: Mucosa pink moist, no lesions. Neck:  Supple; no masses felt Lungs:  No respiratory distress Abdomen:   Flat, soft, nondistended, nontender Rectal:  melenic stool Msk:  MAEW x4, No clubbing or cyanosis Neurologic:  Alert and  oriented x4;  Cranial nerves II-XII intact.  Skin:  Warm, dry, pink without significant lesions or rashes. Psych:  Alert and cooperative. Normal affect.  LAB RESULTS: Recent Labs    11/11/20 1351  WBC 6.2  HGB 7.9*  HCT 24.3*  PLT 78*   BMET Recent Labs    11/11/20 1351  NA 135  K 3.5  CL 93*  CO2 29   GLUCOSE 86  BUN 31*  CREATININE 2.35*  CALCIUM 8.2*   LFT Recent Labs    11/11/20 1351  PROT 6.1*  ALBUMIN 2.9*  AST 46*  ALT 22  ALKPHOS 161*  BILITOT 0.9   PT/INR No results for input(s): LABPROT, INR in the last 72 hours.  STUDIES: DG Chest Portable 1 View  Result Date: 11/11/2020 CLINICAL DATA:  Shortness of breath and weakness. EXAM: PORTABLE CHEST 1 VIEW COMPARISON:  07/10/2020 FINDINGS: Previous median sternotomy and CABG. Chronic cardiomegaly and aortic atherosclerosis. Pulmonary venous hypertension with mild interstitial edema. No effusions. No focal pulmonary finding. IMPRESSION: Congestive heart failure. Cardiomegaly. Pulmonary venous hypertension. Mild interstitial edema. Electronically Signed   By: Nelson Chimes M.D.   On: 11/11/2020 14:33       Impression / Plan:   85 y/o gentleman with history of HFrEF (EF 40%) with NYHA Class III symptoms at baseline, COPD, and ESRD on hemodialysis here for acute on chronic anemia (7.9, baseline is 8-9) with black diarrhea. Given recent antibiotic use, he has risk factors for c. Diff. Less likely to be PUD given daily PPI use and avoidance of NSAIDS  - recommend checking stool for c. Diff - check BNP - check lactate - maintain two large bore IV's - clear liquid diet for now - maintain active type and screen - IV PPI BID - patient at high risk for any endoscopic procedure, consider cardiology consult for risk stratification - can make NPO at midnight but would first like to follow-up stool studies for C. Diff before considering endoscopic evaluation - avoid prophylactic heparin  Please call with any questions or concerns.  Raylene Miyamoto MD, MPH [place]

## 2020-11-11 NOTE — ED Notes (Signed)
Patient given pillows underneath knees and behind head for comfort. Yellow socks on patient. And patient is covered with two blankets.

## 2020-11-11 NOTE — ED Notes (Signed)
Patient appears comfortable. No change in status.

## 2020-11-11 NOTE — ED Triage Notes (Signed)
Per EMS report, patient had a normal day at Dialysis today. Patient c/o weakness. EMT report blood pressure in the [id number] systolic. Patient also c/o diarrhea with black, tarry stools. Patient has a history of GI bleed. EMT report heart rate in the 50's which decreased to the 30's when rhythm was bigeminy.

## 2020-11-12 ENCOUNTER — Encounter
Admission: EM | Disposition: A | Discharge status: Home Health | Payer: Self-pay | Source: Home / Self Care | Attending: Internal Medicine

## 2020-11-12 DIAGNOSIS — D500 Iron deficiency anemia secondary to blood loss (chronic): Secondary | ICD-10-CM

## 2020-11-12 DIAGNOSIS — A0472 Enterocolitis due to Clostridium difficile, not specified as recurrent: Principal | ICD-10-CM

## 2020-11-12 LAB — TYPE AND SCREEN
ABO/RH(D): O POS
Antibody Screen: NEGATIVE
Unit division: 0

## 2020-11-12 LAB — CBC
HCT: 23.9 % — ABNORMAL LOW (ref 39.0–52.0)
HCT: 25.4 % — ABNORMAL LOW (ref 39.0–52.0)
HCT: 25.5 % — ABNORMAL LOW (ref 39.0–52.0)
HCT: 25.4 % — ABNORMAL LOW (ref 39.0–52.0)
Hemoglobin: 7.9 g/dL — ABNORMAL LOW (ref 13.0–17.0)
Hemoglobin: 8.3 g/dL — ABNORMAL LOW (ref 13.0–17.0)
Hemoglobin: 8.4 g/dL — ABNORMAL LOW (ref 13.0–17.0)
Hemoglobin: 8.3 g/dL — ABNORMAL LOW (ref 13.0–17.0)
MCH: 33.2 pg (ref 26.0–34.0)
MCH: 33.2 pg (ref 26.0–34.0)
MCH: 33.6 pg (ref 26.0–34.0)
MCH: 33.1 pg (ref 26.0–34.0)
MCHC: 33.1 g/dL (ref 30.0–36.0)
MCHC: 32.7 g/dL (ref 30.0–36.0)
MCHC: 32.9 g/dL (ref 30.0–36.0)
MCHC: 32.7 g/dL (ref 30.0–36.0)
MCV: 100.4 fL — ABNORMAL HIGH (ref 80.0–100.0)
MCV: 101.6 fL — ABNORMAL HIGH (ref 80.0–100.0)
MCV: 102 fL — ABNORMAL HIGH (ref 80.0–100.0)
MCV: 101.2 fL — ABNORMAL HIGH (ref 80.0–100.0)
Platelets: 86 10*3/uL — ABNORMAL LOW (ref 150–400)
Platelets: 97 10*3/uL — ABNORMAL LOW (ref 150–400)
Platelets: 92 10*3/uL — ABNORMAL LOW (ref 150–400)
Platelets: 96 10*3/uL — ABNORMAL LOW (ref 150–400)
RBC: 2.38 MIL/uL — ABNORMAL LOW (ref 4.22–5.81)
RBC: 2.5 MIL/uL — ABNORMAL LOW (ref 4.22–5.81)
RBC: 2.5 MIL/uL — ABNORMAL LOW (ref 4.22–5.81)
RBC: 2.51 MIL/uL — ABNORMAL LOW (ref 4.22–5.81)
RDW: 18.6 % — ABNORMAL HIGH (ref 11.5–15.5)
RDW: 18.1 % — ABNORMAL HIGH (ref 11.5–15.5)
RDW: 18.5 % — ABNORMAL HIGH (ref 11.5–15.5)
RDW: 18.5 % — ABNORMAL HIGH (ref 11.5–15.5)
WBC: 5.8 10*3/uL (ref 4.0–10.5)
WBC: 6.1 10*3/uL (ref 4.0–10.5)
WBC: 6 10*3/uL (ref 4.0–10.5)
WBC: 6.4 10*3/uL (ref 4.0–10.5)
nRBC: 0 % (ref 0.0–0.2)
nRBC: 0 % (ref 0.0–0.2)
nRBC: 0 % (ref 0.0–0.2)
nRBC: 0 % (ref 0.0–0.2)

## 2020-11-12 LAB — HEMOGLOBIN A1C
Hgb A1c MFr Bld: 5.2 % (ref 4.8–5.6)
Mean Plasma Glucose: 103 mg/dL

## 2020-11-12 LAB — C DIFFICILE QUICK SCREEN W PCR REFLEX
C Diff antigen: POSITIVE — AB
C Diff interpretation: DETECTED
C Diff toxin: POSITIVE — AB

## 2020-11-12 LAB — LIPID PANEL
Cholesterol: 70 mg/dL (ref 0–200)
HDL: 20 mg/dL — ABNORMAL LOW (ref >40)
LDL Cholesterol: 30 mg/dL (ref 0–99)
Total CHOL/HDL Ratio: 3.5 RATIO
Triglycerides: 102 mg/dL (ref <150)
VLDL: 20 mg/dL (ref 0–40)

## 2020-11-12 LAB — BPAM RBC
Blood Product Expiration Date: 202211132359
ISSUE DATE / TIME: 202210061610
Unit Type and Rh: 5100

## 2020-11-12 LAB — BASIC METABOLIC PANEL
Anion gap: 11 (ref 5–15)
BUN: 40 mg/dL — ABNORMAL HIGH (ref 8–23)
CO2: 30 mmol/L (ref 22–32)
Calcium: 8.3 mg/dL — ABNORMAL LOW (ref 8.9–10.3)
Chloride: 95 mmol/L — ABNORMAL LOW (ref 98–111)
Creatinine, Ser: 3.49 mg/dL — ABNORMAL HIGH (ref 0.61–1.24)
GFR, Estimated: 17 mL/min — ABNORMAL LOW (ref >60)
Glucose, Bld: 80 mg/dL (ref 70–99)
Potassium: 3.9 mmol/L (ref 3.5–5.1)
Sodium: 136 mmol/L (ref 135–145)

## 2020-11-12 LAB — TSH: TSH: 22.283 u[IU]/mL — ABNORMAL HIGH (ref 0.350–4.500)

## 2020-11-12 LAB — CBG MONITORING, ED: Glucose-Capillary: 76 mg/dL (ref 70–99)

## 2020-11-12 SURGERY — ESOPHAGOGASTRODUODENOSCOPY (EGD) WITH PROPOFOL
Anesthesia: General

## 2020-11-12 MED ORDER — VANCOMYCIN 50 MG/ML ORAL SOLUTION
125.0000 mg | Freq: Four times a day (QID) | ORAL | Status: DC
  Filled 2020-11-12 (×2): qty 2.5

## 2020-11-12 MED ORDER — VANCOMYCIN HCL 125 MG PO CAPS
125.0000 mg | ORAL_CAPSULE | Freq: Four times a day (QID) | ORAL | Status: DC
  Administered 2020-11-12 – 2020-11-15 (×12): 125 mg via ORAL
  Filled 2020-11-12 (×16): qty 1

## 2020-11-12 MED ORDER — FIDAXOMICIN 200 MG PO TABS: 200.0000 mg | ORAL_TABLET | Freq: Two times a day (BID) | ORAL | Status: DC

## 2020-11-12 NOTE — Progress Notes (Signed)
[facility]  ROUNDING NOTE   Subjective:   [name] was admitted to [facility] on 11/11/2020 for GI bleeding [K92.2]  Last hemodialysis treatment was yesterday, 10/6.   Patient was having diarrhea for last few weeks. Hemoglobin dropped to 7.9 from 8.1.    Objective:  Vital signs in last 24 hours:  Temp:  [98 F (36.7 C)-98.8 F (37.1 C)] 98.6 F (37 C) (10/07 0801) Pulse Rate:  [53-67] 56 (10/07 0801) Resp:  [15-21] 18 (10/07 0801) BP: (90-117)/(39-70) 91/63 (10/07 0801) SpO2:  [96 %-100 %] 97 % (10/07 0801) Weight:  [74.4 kg] 74.4 kg (10/06 1358)  Weight change:  Filed Weights   11/11/20 1358  Weight: 74.4 kg    Intake/Output: I/O last 3 completed shifts: In: 400 [Blood:300; IV Piggyback:100] Out: -    Intake/Output this shift:  No intake/output data recorded.  Physical Exam: General: NAD, laying in bed  Head: Normocephalic, atraumatic. Moist oral mucosal membranes  Eyes: Anicteric, PERRL  Neck: Supple, trachea midline  Lungs:  Clear to auscultation  Heart: Regular rate and rhythm  Abdomen:  Soft, nontender,   Extremities:  no peripheral edema.  Neurologic: Nonfocal, moving all four extremities  Skin: +ecchymosis   Access: Left AVF    Basic Metabolic Panel: Recent Labs  Lab 11/11/20 1351 11/12/20 0640  NA 135 136  K 3.5 3.9  CL 93* 95*  CO2 29 30  GLUCOSE 86 80  BUN 31* 40*  CREATININE 2.35* 3.49*  CALCIUM 8.2* 8.3*    Liver Function Tests: Recent Labs  Lab 11/11/20 1351  AST 46*  ALT 22  ALKPHOS 161*  BILITOT 0.9  PROT 6.1*  ALBUMIN 2.9*   No results for input(s): LIPASE, AMYLASE in the last 168 hours. No results for input(s): AMMONIA in the last 168 hours.  CBC: Recent Labs  Lab 11/11/20 1351 11/11/20 1539 11/11/20 2146 11/12/20 0640  WBC 6.2 5.5 6.0 5.8  HGB 7.9* 7.1* 8.1* 7.9*  HCT 24.3* 21.4* 24.3* 23.9*  MCV 102.1* 100.9* 100.0 100.4*  PLT 78* 75* 79* 86*    Cardiac Enzymes: No results for  input(s): CKTOTAL, CKMB, CKMBINDEX, TROPONINI in the last 168 hours.  BNP: Invalid input(s): POCBNP  CBG: Recent Labs  Lab 11/12/20 0840  GLUCAP 1    Microbiology: Results for orders placed or performed during the hospital encounter of 11/11/20  Resp Panel by RT-PCR (Flu A&B, Covid) Nasopharyngeal Swab     Status: None   Collection Time: 11/11/20  1:45 PM   Specimen: Nasopharyngeal Swab; Nasopharyngeal(NP) swabs in vial transport medium  Result Value Ref Range Status   SARS Coronavirus 2 by RT PCR NEGATIVE NEGATIVE Final    Comment: (NOTE) SARS-CoV-2 target nucleic acids are NOT DETECTED.  The SARS-CoV-2 RNA is generally detectable in upper respiratory specimens during the acute phase of infection. The lowest concentration of SARS-CoV-2 viral copies this assay can detect is 138 copies/mL. A negative result does not preclude SARS-Cov-2 infection and should not be used as the sole basis for treatment or other patient management decisions. A negative result may occur with  improper specimen collection/handling, submission of specimen other than nasopharyngeal swab, presence of viral mutation(s) within the areas targeted by this assay, and inadequate number of viral copies(<138 copies/mL). A negative result must be combined with clinical observations, patient history, and epidemiological information. The expected result is Negative.  Fact Sheet for Patients:  EntrepreneurPulse.com.au  Fact Sheet for Healthcare Providers:  IncredibleEmployment.be  This test  is no t yet approved or cleared by the [place] and  has been authorized for detection and/or diagnosis of SARS-CoV-2 by FDA under an Emergency Use Authorization (EUA). This EUA will remain  in effect (meaning this test can be used) for the duration of the COVID-19 declaration under Section 564(b)(1) of the Act, 21 U.S.C.section 360bbb-3(b)(1), unless the authorization is  terminated  or revoked sooner.       Influenza A by PCR NEGATIVE NEGATIVE Final   Influenza B by PCR NEGATIVE NEGATIVE Final    Comment: (NOTE) The Xpert Xpress SARS-CoV-2/FLU/RSV plus assay is intended as an aid in the diagnosis of influenza from Nasopharyngeal swab specimens and should not be used as a sole basis for treatment. Nasal washings and aspirates are unacceptable for Xpert Xpress SARS-CoV-2/FLU/RSV testing.  Fact Sheet for Patients: EntrepreneurPulse.com.au  Fact Sheet for Healthcare Providers: IncredibleEmployment.be  This test is not yet approved or cleared by the [place] FDA and has been authorized for detection and/or diagnosis of SARS-CoV-2 by FDA under an Emergency Use Authorization (EUA). This EUA will remain in effect (meaning this test can be used) for the duration of the COVID-19 declaration under Section 564(b)(1) of the Act, 21 U.S.C. section 360bbb-3(b)(1), unless the authorization is terminated or revoked.  Performed at [facility], [address] [place], [place] [address]     Coagulation Studies: Recent Labs    11/11/20 1621  LABPROT 16.9*  INR 1.4*    Urinalysis: No results for input(s): COLORURINE, LABSPEC, PHURINE, GLUCOSEU, HGBUR, BILIRUBINUR, KETONESUR, PROTEINUR, UROBILINOGEN, NITRITE, LEUKOCYTESUR in the last 72 hours.  Invalid input(s): APPERANCEUR    Imaging: DG Chest Portable 1 View  Result Date: 11/11/2020 CLINICAL DATA:  Shortness of breath and weakness. EXAM: PORTABLE CHEST 1 VIEW COMPARISON:  07/10/2020 FINDINGS: Previous median sternotomy and CABG. Chronic cardiomegaly and aortic atherosclerosis. Pulmonary venous hypertension with mild interstitial edema. No effusions. No focal pulmonary finding. IMPRESSION: Congestive heart failure. Cardiomegaly. Pulmonary venous hypertension. Mild interstitial edema. Electronically Signed   By: Nelson Chimes M.D.   On: 11/11/2020 14:33      Medications:     atorvastatin  80 mg Oral Daily   calcium acetate  1,334 mg Oral TID WC   escitalopram  20 mg Oral Daily   loratadine  10 mg Oral Daily   midodrine  10 mg Oral TID WC   multivitamin  1 tablet Oral QHS   pantoprazole (PROTONIX) IV  40 mg Intravenous Q12H   rOPINIRole  1.5 mg Oral QHS   acetaminophen, fluticasone, melatonin, ondansetron (ZOFRAN) IV  Assessment/ Plan:  Mr. TREY SANT is a 85 y.o. white male with end stage renal disease on hemodialysis, hypotension, coronary artery disease status post CABG, hyperlipidemia, BPH, congestive heart failure, COPD, AAA status post stent, peptic ulcer disease, left warthin tumor who is admitted to [facility] on 11/11/2020 for GI bleeding [K92.2]  [facility] TTS [address] Left AVF 72kg  End Stage Renal Disease: last hemodialysis treatment was yesterday, 10/6, as an outpatient. No indication for dialysis today.  - Continue TTS.   Hypotension: 91/63 while examined. Driven by anemia.  - Continue midodrine.   Anemia with chronic kidney disease: and GI bleed. Hemoglobin 7.9, macrocytic. Baseline hemoglobin of 10.9 on 10/14/20.  - Mircera as outpatient.  - appreciate GI input.   Secondary Hyperparathyroidism:  - Calcium acetate with meals.    LOS: 1 Gargi Berch 10/7/202210:36 AM

## 2020-11-12 NOTE — ED Notes (Signed)
Nikki RN aware of assigned bed 

## 2020-11-12 NOTE — Progress Notes (Signed)
PROGRESS NOTE    Brent Glover  [id number] DOB: [birth year] DOA: 11/11/2020 PCP: Christen Bame, DO   Assessment & Plan:   Principal Problem:   GI bleeding Active Problems:   Hypertension   Hypercholesterolemia   Anemia due to blood loss   ESRD (end stage renal disease) ([name])   Chronic systolic CHF (congestive heart failure) ([name])   Anemia in ESRD (end-stage renal disease) ([name])   CAD (coronary artery disease)   Elevated troponin   Hypotension   Bradycardia   Thrombocytopenia ([name])   Depression   GI bleeding: continue on IV PPI. S/p 1 unit of pRBCs transfused. H&H are labile. IV zofran prn. No NSAIDs. No EGD as c. diff came back positive as per GI. GI following peripherally as per GI   Acute blood loss anemia: w/ likely hx of macrocytic anemia vs ACD. Will check B12 & folate levels. S/p 1 unit of pRBCs transfused. Will continue to monitor H&H   C. diff positive: started on po vanco x 10 days as per GI   Hypotension: likely secondary to above. Keep MAP >65   Hx of HTN: hold all home anti-HTN meds    HLD: continue on statin   ESRD: on HD TTS. Nephro consulted    Chronic systolic CHF: appears compensated. Echo on 09/30/2017 showed EF of 35-40% with grade 1 diastolic dysfunction. Volume management w/ HD   Hx of CAD: w/ minimally elevated troponins. No chest pain, likely due to nonspecific elevation or decreased clearance in the setting of ESRD   Bradycardia: hold metoprolol. Continue on tele   Thrombocytopenia: chronic.   Depression: severity unknown. Continue on home dose of lexapro    DVT prophylaxis: SCDs Code Status: full  Family Communication: called pt's daughter, Jeannene Patella, but no answer so I left a message  Disposition Plan: depends on PT/OT recs (not consulted yet)  Level of care: Med-Surg  Status is: Inpatient  Remains inpatient appropriate because:Unsafe d/c plan and Inpatient level of care appropriate due to severity of illness  Dispo: The  patient is from: Home              Anticipated d/c is to: Home              Patient currently is not medically stable to d/c.   Difficult to place patient : unclear    Consultants:  GI  Procedures:   Antimicrobials: po vanco    Subjective: Pt c/o black stools  Objective: Vitals:   11/11/20 2310 11/12/20 0110 11/12/20 0200 11/12/20 0801  BP: (!) 106/50 (!) 107/53 (!) 103/41 91/63  Pulse: (!) 58 (!) 55 (!) 55 (!) 56  Resp: 17 (!) '21 15 18  '$ Temp:    98.6 F (37 C)  TempSrc:    Oral  SpO2: 96% 97% 97% 97%  Weight:      Height:        Intake/Output Summary (Last 24 hours) at 11/12/2020 0837 Last data filed at 11/11/2020 1825 Gross per 24 hour  Intake 400 ml  Output --  Net 400 ml   Filed Weights   11/11/20 1358  Weight: 74.4 kg    Examination:  General exam: Appears calm but uncomfortable  Respiratory system: Clear to auscultation. Respiratory effort normal. Cardiovascular system: S1 & S2 +. No rubs, gallops or clicks.  Gastrointestinal system: Abdomen is nondistended, soft and nontender. Normal bowel sounds heard. Central nervous system: Alert and oriented. Moves all extremities  Psychiatry: Judgement and insight appear  normal. Flat mood and affect Skin: bruises over face & b/l UE (POA)    Data Reviewed: I have personally reviewed following labs and imaging studies  CBC: Recent Labs  Lab 11/11/20 1351 11/11/20 1539 11/11/20 2146 11/12/20 0640  WBC 6.2 5.5 6.0 5.8  HGB 7.9* 7.1* 8.1* 7.9*  HCT 24.3* 21.4* 24.3* 23.9*  MCV 102.1* 100.9* 100.0 100.4*  PLT 78* 75* 79* 86*   Basic Metabolic Panel: Recent Labs  Lab 11/11/20 1351 11/12/20 0640  NA 135 136  K 3.5 3.9  CL 93* 95*  CO2 29 30  GLUCOSE 86 80  BUN 31* 40*  CREATININE 2.35* 3.49*  CALCIUM 8.2* 8.3*   GFR: Estimated Creatinine Clearance: 15.3 mL/min (A) (by C-G formula based on SCr of 3.49 mg/dL (H)). Liver Function Tests: Recent Labs  Lab 11/11/20 1351  AST 46*  ALT 22  ALKPHOS  161*  BILITOT 0.9  PROT 6.1*  ALBUMIN 2.9*   No results for input(s): LIPASE, AMYLASE in the last 168 hours. No results for input(s): AMMONIA in the last 168 hours. Coagulation Profile: Recent Labs  Lab 11/11/20 1621  INR 1.4*   Cardiac Enzymes: No results for input(s): CKTOTAL, CKMB, CKMBINDEX, TROPONINI in the last 168 hours. BNP (last 3 results) No results for input(s): PROBNP in the last 8760 hours. HbA1C: No results for input(s): HGBA1C in the last 72 hours. CBG: No results for input(s): GLUCAP in the last 168 hours. Lipid Profile: Recent Labs    11/12/20 0640  CHOL 70  HDL 20*  LDLCALC 30  TRIG 102  CHOLHDL 3.5   Thyroid Function Tests: Recent Labs    11/12/20 0640  TSH 22.283*   Anemia Panel: Recent Labs    11/11/20 1351 11/11/20 1539  FERRITIN  --  437*  TIBC 258  --   IRON 75  --    Sepsis Labs: No results for input(s): PROCALCITON, LATICACIDVEN in the last 168 hours.  Recent Results (from the past 240 hour(s))  Resp Panel by RT-PCR (Flu A&B, Covid) Nasopharyngeal Swab     Status: None   Collection Time: 11/11/20  1:45 PM   Specimen: Nasopharyngeal Swab; Nasopharyngeal(NP) swabs in vial transport medium  Result Value Ref Range Status   SARS Coronavirus 2 by RT PCR NEGATIVE NEGATIVE Final    Comment: (NOTE) SARS-CoV-2 target nucleic acids are NOT DETECTED.  The SARS-CoV-2 RNA is generally detectable in upper respiratory specimens during the acute phase of infection. The lowest concentration of SARS-CoV-2 viral copies this assay can detect is 138 copies/mL. A negative result does not preclude SARS-Cov-2 infection and should not be used as the sole basis for treatment or other patient management decisions. A negative result may occur with  improper specimen collection/handling, submission of specimen other than nasopharyngeal swab, presence of viral mutation(s) within the areas targeted by this assay, and inadequate number of viral copies(<138  copies/mL). A negative result must be combined with clinical observations, patient history, and epidemiological information. The expected result is Negative.  Fact Sheet for Patients:  EntrepreneurPulse.com.au  Fact Sheet for Healthcare Providers:  IncredibleEmployment.be  This test is no t yet approved or cleared by the [place] FDA and  has been authorized for detection and/or diagnosis of SARS-CoV-2 by FDA under an Emergency Use Authorization (EUA). This EUA will remain  in effect (meaning this test can be used) for the duration of the COVID-19 declaration under Section 564(b)(1) of the Act, 21 U.S.C.section 360bbb-3(b)(1), unless the authorization is terminated  or revoked sooner.       Influenza A by PCR NEGATIVE NEGATIVE Final   Influenza B by PCR NEGATIVE NEGATIVE Final    Comment: (NOTE) The Xpert Xpress SARS-CoV-2/FLU/RSV plus assay is intended as an aid in the diagnosis of influenza from Nasopharyngeal swab specimens and should not be used as a sole basis for treatment. Nasal washings and aspirates are unacceptable for Xpert Xpress SARS-CoV-2/FLU/RSV testing.  Fact Sheet for Patients: EntrepreneurPulse.com.au  Fact Sheet for Healthcare Providers: IncredibleEmployment.be  This test is not yet approved or cleared by the [place] FDA and has been authorized for detection and/or diagnosis of SARS-CoV-2 by FDA under an Emergency Use Authorization (EUA). This EUA will remain in effect (meaning this test can be used) for the duration of the COVID-19 declaration under Section 564(b)(1) of the Act, 21 U.S.C. section 360bbb-3(b)(1), unless the authorization is terminated or revoked.  Performed at [facility], [address], [place], [place] [address]          Radiology Studies: DG Chest Portable 1 View  Result Date: 11/11/2020 CLINICAL DATA:  Shortness of breath and  weakness. EXAM: PORTABLE CHEST 1 VIEW COMPARISON:  07/10/2020 FINDINGS: Previous median sternotomy and CABG. Chronic cardiomegaly and aortic atherosclerosis. Pulmonary venous hypertension with mild interstitial edema. No effusions. No focal pulmonary finding. IMPRESSION: Congestive heart failure. Cardiomegaly. Pulmonary venous hypertension. Mild interstitial edema. Electronically Signed   By: Nelson Chimes M.D.   On: 11/11/2020 14:33        Scheduled Meds:  atorvastatin  80 mg Oral Daily   calcium acetate  1,334 mg Oral TID WC   escitalopram  20 mg Oral Daily   loratadine  10 mg Oral Daily   midodrine  10 mg Oral TID WC   multivitamin  1 tablet Oral QHS   pantoprazole (PROTONIX) IV  40 mg Intravenous Q12H   rOPINIRole  1.5 mg Oral QHS   Continuous Infusions:   LOS: 1 day    Time spent: 31 mins     Wyvonnia Dusky, MD [facility] Pager [phone]  If 7PM-7AM, please contact night-coverage 11/12/2020, 8:37 AM

## 2020-11-12 NOTE — Progress Notes (Signed)
Lab called to collect CBC

## 2020-11-12 NOTE — Progress Notes (Signed)
GI Inpatient Follow-up Note  Subjective:  Patient seen and c. Diff has returned positive. Some abdominal cramping.  Scheduled Inpatient Medications:   atorvastatin  80 mg Oral Daily   calcium acetate  1,334 mg Oral TID WC   escitalopram  20 mg Oral Daily   loratadine  10 mg Oral Daily   midodrine  10 mg Oral TID WC   multivitamin  1 tablet Oral QHS   pantoprazole (PROTONIX) IV  40 mg Intravenous Q12H   rOPINIRole  1.5 mg Oral QHS   vancomycin  125 mg Oral QID    Continuous Inpatient Infusions:    PRN Inpatient Medications:  acetaminophen, fluticasone, melatonin, ondansetron (ZOFRAN) IV  Review of Systems:  Review of Systems  Constitutional:  Negative for chills and fever.  Respiratory:  Positive for shortness of breath. Negative for cough.   Cardiovascular:  Negative for chest pain.  Gastrointestinal:  Positive for diarrhea. Negative for blood in stool, constipation, heartburn, nausea and vomiting.  Skin:  Negative for itching and rash.  Neurological:  Negative for focal weakness.  Endo/Heme/Allergies:  Bruises/bleeds easily.  Psychiatric/Behavioral:  Negative for substance abuse.   All other systems reviewed and are negative.    Physical Examination: BP (!) 120/57 (BP Location: Right Arm)   Pulse (!) 57   Temp 98.1 F (36.7 C) (Oral)   Resp 20   Ht '5\' 7"'$  (1.702 m)   Wt 74.4 kg   SpO2 96%   BMI 25.69 kg/m  Gen: NAD, alert and oriented x 4 HEENT: PEERLA, EOMI, Neck: supple, no JVD or thyromegaly Chest: No respiratory distress Abd: soft, non-tender, non-distended Ext: trace edema, well perfused with 2+ pulses, Skin: no rash or lesions noted Lymph: no LAD  Data: Lab Results  Component Value Date   WBC 6.1 11/12/2020   HGB 8.3 (L) 11/12/2020   HCT 25.4 (L) 11/12/2020   MCV 101.6 (H) 11/12/2020   PLT 97 (L) 11/12/2020   Recent Labs  Lab 11/11/20 2146 11/12/20 0640 11/12/20 1125  HGB 8.1* 7.9* 8.3*   Lab Results  Component Value Date   NA 136  11/12/2020   K 3.9 11/12/2020   CL 95 (L) 11/12/2020   CO2 30 11/12/2020   BUN 40 (H) 11/12/2020   CREATININE 3.49 (H) 11/12/2020   Lab Results  Component Value Date   ALT 22 11/11/2020   AST 46 (H) 11/11/2020   ALKPHOS 161 (H) 11/11/2020   BILITOT 0.9 11/11/2020   Recent Labs  Lab 11/11/20 1621  APTT 33  INR 1.4*   Assessment/Plan: 85 y/o gentleman with history of HFrEF (EF 40%) with NYHA Class III symptoms at baseline, COPD, and ESRD on hemodialysis here for acute on chronic anemia (7.9, baseline is 8-9) with black diarrhea. He has tested positive for c. diff  Recommendations:  - continue PO vanc - advance diet as tolerated - no need for EGD given active  c diff infection explains his symptoms - supportive care  GI will follow peripherally. [facility] is on call this weekend, if any questions they are available.  Raylene Miyamoto MD, MPH [place]

## 2020-11-12 NOTE — Plan of Care (Signed)
Patient orietneed to room and call bell.  Bed locked in lowest position with bed alarm in use.  Patient alert and oriented and verbalized understanding to call for assistance.

## 2020-11-13 LAB — CBC
HCT: 25.7 % — ABNORMAL LOW (ref 39.0–52.0)
HCT: 25.3 % — ABNORMAL LOW (ref 39.0–52.0)
Hemoglobin: 8.2 g/dL — ABNORMAL LOW (ref 13.0–17.0)
Hemoglobin: 8.3 g/dL — ABNORMAL LOW (ref 13.0–17.0)
MCH: 31.9 pg (ref 26.0–34.0)
MCH: 32.4 pg (ref 26.0–34.0)
MCHC: 31.9 g/dL (ref 30.0–36.0)
MCHC: 32.8 g/dL (ref 30.0–36.0)
MCV: 100 fL (ref 80.0–100.0)
MCV: 98.8 fL (ref 80.0–100.0)
Platelets: 101 10*3/uL — ABNORMAL LOW (ref 150–400)
Platelets: 100 10*3/uL — ABNORMAL LOW (ref 150–400)
RBC: 2.57 MIL/uL — ABNORMAL LOW (ref 4.22–5.81)
RBC: 2.56 MIL/uL — ABNORMAL LOW (ref 4.22–5.81)
RDW: 18.7 % — ABNORMAL HIGH (ref 11.5–15.5)
RDW: 19 % — ABNORMAL HIGH (ref 11.5–15.5)
WBC: 6.2 10*3/uL (ref 4.0–10.5)
WBC: 5.6 10*3/uL (ref 4.0–10.5)
nRBC: 0 % (ref 0.0–0.2)
nRBC: 0 % (ref 0.0–0.2)

## 2020-11-13 LAB — BASIC METABOLIC PANEL
Anion gap: 13 (ref 5–15)
BUN: 57 mg/dL — ABNORMAL HIGH (ref 8–23)
CO2: 26 mmol/L (ref 22–32)
Calcium: 8.9 mg/dL (ref 8.9–10.3)
Chloride: 94 mmol/L — ABNORMAL LOW (ref 98–111)
Creatinine, Ser: 5.22 mg/dL — ABNORMAL HIGH (ref 0.61–1.24)
GFR, Estimated: 10 mL/min — ABNORMAL LOW (ref >60)
Glucose, Bld: 86 mg/dL (ref 70–99)
Potassium: 4 mmol/L (ref 3.5–5.1)
Sodium: 133 mmol/L — ABNORMAL LOW (ref 135–145)

## 2020-11-13 LAB — MRSA NEXT GEN BY PCR, NASAL: MRSA by PCR Next Gen: NOT DETECTED

## 2020-11-13 LAB — HEPATITIS B SURFACE ANTIGEN: Hepatitis B Surface Ag: NONREACTIVE

## 2020-11-13 LAB — GLUCOSE, CAPILLARY: Glucose-Capillary: 83 mg/dL (ref 70–99)

## 2020-11-13 MED ORDER — NEPRO/CARBSTEADY PO LIQD
237.0000 mL | Freq: Two times a day (BID) | ORAL | Status: DC
  Administered 2020-11-14 – 2020-11-15 (×3): 237 mL via ORAL

## 2020-11-13 NOTE — Progress Notes (Signed)
Initial Nutrition Assessment  DOCUMENTATION CODES:   Not applicable  INTERVENTION:   Continue Renal MVI  Nepro Shake po BID, each supplement provides 425 kcal and 19 grams protein   NUTRITION DIAGNOSIS:   Increased nutrient needs related to acute illness, chronic illness as evidenced by estimated needs.  GOAL:   Patient will meet greater than or equal to 90% of their needs  MONITOR:   PO intake, Supplement acceptance, Labs, Weight trends  REASON FOR ASSESSMENT:   Consult    ASSESSMENT:   85 yo male admitted with acute on chronic anemia with black diarrhea, C.diff positive. PMH includes ESRD/HD, COPD, CHF EF 40%, CAD/CABG, PUD, DM  Pt receiving iHD currently; unable to reach pt via phone  Currently on Soft diet, no recorded po intake  +abdominal cramping Diarrhea for the last several weeks; C.dff positive. GI following  EDW: 72 kg Current wt: 74.4 kg  Labs: sodium 133 (L), CBGs <100, no phosphorus level, potassium wdl Meds: Rena-vite, PhosLo TID with meals  NUTRITION - FOCUSED PHYSICAL EXAM:  Unable to perform  Diet Order:   Diet Order             DIET SOFT Room service appropriate? Yes; Fluid consistency: Thin; Fluid restriction: 1200 mL Fluid  Diet effective now                   EDUCATION NEEDS:   Not appropriate for education at this time  Skin:  Skin Assessment: Reviewed RN Assessment  Last BM:  10/8  Height:   Ht Readings from Last 1 Encounters:  11/11/20 '5\' 7"'$  (1.702 m)    Weight:   Wt Readings from Last 1 Encounters:  11/11/20 74.4 kg    BMI:  Body mass index is 25.69 kg/m.  Estimated Nutritional Needs:   Kcal:  1800-2000 kcals  Protein:  90-100 g  Fluid:  1000 ml plus UOP   Kerman Passey MS, RDN, LDN, CNSC Registered Dietitian III Clinical Nutrition RD Pager and On-Call Pager Number Located in [place]

## 2020-11-13 NOTE — Progress Notes (Signed)
PT Cancellation Note  Patient Details Name: Brent Glover MRN: [id number] DOB: 07/26/1935   Cancelled Treatment:    Reason Eval/Treat Not Completed: Patient at procedure or test/unavailable PT orders received, chart reviewed. Pt off unit at hemodialysis. Will attempt to see pt once back on unit.  [name], PT, DPT 11/13/20, 12:09 PM    [name] 11/13/2020, 12:09 PM

## 2020-11-13 NOTE — Progress Notes (Signed)
[facility]  ROUNDING NOTE   Subjective:   [name] was admitted to [facility] on 11/11/2020 for GI bleeding [K92.2] Hypotension, unspecified hypotension type [I95.9] Gastrointestinal hemorrhage, unspecified gastrointestinal hemorrhage type [K92.2]  Last hemodialysis treatment was yesterday, 10/6.   Patient seen and evaluated during dialysis   HEMODIALYSIS FLOWSHEET:  Blood Flow Rate (mL/min): 400 mL/min Arterial Pressure (mmHg): -170 mmHg Venous Pressure (mmHg): 170 mmHg Transmembrane Pressure (mmHg): 60 mmHg Ultrafiltration Rate (mL/min): 500 mL/min Dialysate Flow Rate (mL/min): 500 ml/min Conductivity: Machine : 13.5 Conductivity: Machine : 13.5  Resting comfortably during dialysis   Objective:  Vital signs in last 24 hours:  Temp:  [97.7 F (36.5 C)-98.4 F (36.9 C)] 98.4 F (36.9 C) (10/08 1000) Pulse Rate:  [57-78] 78 (10/08 0736) Resp:  [18-21] 21 (10/08 1000) BP: (113-128)/(47-76) 128/52 (10/08 1000) SpO2:  [94 %-98 %] 96 % (10/08 0736)  Weight change:  Filed Weights   11/11/20 1358  Weight: 74.4 kg    Intake/Output: I/O last 3 completed shifts: In: -  Out: 2 [Stool:2]   Intake/Output this shift:  Total I/O In: 60 [P.O.:60] Out: -   Physical Exam: General: NAD, laying in bed  Head: Normocephalic, atraumatic. Moist oral mucosal membranes  Eyes: Anicteric  Lungs:  Clear to auscultation, normal effort  Heart: Regular rate and rhythm  Abdomen:  Soft, nontender  Extremities:  no peripheral edema.  Neurologic: Nonfocal, moving all four extremities  Skin: +ecchymosis   Access: Left AVF    Basic Metabolic Panel: Recent Labs  Lab 11/11/20 1351 11/12/20 0640  NA 135 136  K 3.5 3.9  CL 93* 95*  CO2 29 30  GLUCOSE 86 80  BUN 31* 40*  CREATININE 2.35* 3.49*  CALCIUM 8.2* 8.3*     Liver Function Tests: Recent Labs  Lab 11/11/20 1351  AST 46*  ALT 22  ALKPHOS 161*  BILITOT 0.9  PROT 6.1*  ALBUMIN 2.9*    No  results for input(s): LIPASE, AMYLASE in the last 168 hours. No results for input(s): AMMONIA in the last 168 hours.  CBC: Recent Labs  Lab 11/12/20 0640 11/12/20 1125 11/12/20 1515 11/12/20 1959 11/13/20 0358  WBC 5.8 6.1 6.0 6.4 6.2  HGB 7.9* 8.3* 8.4* 8.3* 8.2*  HCT 23.9* 25.4* 25.5* 25.4* 25.7*  MCV 100.4* 101.6* 102.0* 101.2* 100.0  PLT 86* 97* 92* 96* 101*     Cardiac Enzymes: No results for input(s): CKTOTAL, CKMB, CKMBINDEX, TROPONINI in the last 168 hours.  BNP: Invalid input(s): POCBNP  CBG: Recent Labs  Lab 11/12/20 0840 11/13/20 0734  GLUCAP 76 83     Microbiology: Results for orders placed or performed during the hospital encounter of 11/11/20  Resp Panel by RT-PCR (Flu A&B, Covid) Nasopharyngeal Swab     Status: None   Collection Time: 11/11/20  1:45 PM   Specimen: Nasopharyngeal Swab; Nasopharyngeal(NP) swabs in vial transport medium  Result Value Ref Range Status   SARS Coronavirus 2 by RT PCR NEGATIVE NEGATIVE Final    Comment: (NOTE) SARS-CoV-2 target nucleic acids are NOT DETECTED.  The SARS-CoV-2 RNA is generally detectable in upper respiratory specimens during the acute phase of infection. The lowest concentration of SARS-CoV-2 viral copies this assay can detect is 138 copies/mL. A negative result does not preclude SARS-Cov-2 infection and should not be used as the sole basis for treatment or other patient management decisions. A negative result may occur with  improper specimen collection/handling, submission of specimen other than nasopharyngeal  swab, presence of viral mutation(s) within the areas targeted by this assay, and inadequate number of viral copies(<138 copies/mL). A negative result must be combined with clinical observations, patient history, and epidemiological information. The expected result is Negative.  Fact Sheet for Patients:  EntrepreneurPulse.com.au  Fact Sheet for Healthcare Providers:   IncredibleEmployment.be  This test is no t yet approved or cleared by the [place] FDA and  has been authorized for detection and/or diagnosis of SARS-CoV-2 by FDA under an Emergency Use Authorization (EUA). This EUA will remain  in effect (meaning this test can be used) for the duration of the COVID-19 declaration under Section 564(b)(1) of the Act, 21 U.S.C.section 360bbb-3(b)(1), unless the authorization is terminated  or revoked sooner.       Influenza A by PCR NEGATIVE NEGATIVE Final   Influenza B by PCR NEGATIVE NEGATIVE Final    Comment: (NOTE) The Xpert Xpress SARS-CoV-2/FLU/RSV plus assay is intended as an aid in the diagnosis of influenza from Nasopharyngeal swab specimens and should not be used as a sole basis for treatment. Nasal washings and aspirates are unacceptable for Xpert Xpress SARS-CoV-2/FLU/RSV testing.  Fact Sheet for Patients: EntrepreneurPulse.com.au  Fact Sheet for Healthcare Providers: IncredibleEmployment.be  This test is not yet approved or cleared by the [place] FDA and has been authorized for detection and/or diagnosis of SARS-CoV-2 by FDA under an Emergency Use Authorization (EUA). This EUA will remain in effect (meaning this test can be used) for the duration of the COVID-19 declaration under Section 564(b)(1) of the Act, 21 U.S.C. section 360bbb-3(b)(1), unless the authorization is terminated or revoked.  Performed at [facility], [place], [place] [address]   C Difficile Quick Screen w PCR reflex     Status: Abnormal   Collection Time: 11/12/20 10:57 AM   Specimen: STOOL  Result Value Ref Range Status   C Diff antigen POSITIVE (A) NEGATIVE Final   C Diff toxin POSITIVE (A) NEGATIVE Final   C Diff interpretation Toxin producing C. difficile detected.  Final    Comment: CRITICAL RESULT CALLED TO, READ BACK BY AND VERIFIED WITH: Aileen Pilot, RN  [phone] 11/12/20 GM Performed at [facility], [address] [place], [place] [address]   MRSA Next Gen by PCR, Nasal     Status: None   Collection Time: 11/12/20  6:53 PM   Specimen: Nasal Mucosa; Nasal Swab  Result Value Ref Range Status   MRSA by PCR Next Gen NOT DETECTED NOT DETECTED Final    Comment: (NOTE) The GeneXpert MRSA Assay (FDA approved for NASAL specimens only), is one component of a comprehensive MRSA colonization surveillance program. It is not intended to diagnose MRSA infection nor to guide or monitor treatment for MRSA infections. Test performance is not FDA approved in patients less than 48 years old. Performed at [facility], [address] [place], [place] [address]     Coagulation Studies: Recent Labs    11/11/20 1621  LABPROT 16.9*  INR 1.4*     Urinalysis: No results for input(s): COLORURINE, LABSPEC, PHURINE, GLUCOSEU, HGBUR, BILIRUBINUR, KETONESUR, PROTEINUR, UROBILINOGEN, NITRITE, LEUKOCYTESUR in the last 72 hours.  Invalid input(s): APPERANCEUR    Imaging: DG Chest Portable 1 View  Result Date: 11/11/2020 CLINICAL DATA:  Shortness of breath and weakness. EXAM: PORTABLE CHEST 1 VIEW COMPARISON:  07/10/2020 FINDINGS: Previous median sternotomy and CABG. Chronic cardiomegaly and aortic atherosclerosis. Pulmonary venous hypertension with mild interstitial edema. No effusions. No focal pulmonary finding. IMPRESSION:  Congestive heart failure. Cardiomegaly. Pulmonary venous hypertension. Mild interstitial edema. Electronically Signed   By: Nelson Chimes M.D.   On: 11/11/2020 14:33     Medications:     atorvastatin  80 mg Oral Daily   calcium acetate  1,334 mg Oral TID WC   escitalopram  20 mg Oral Daily   loratadine  10 mg Oral Daily   midodrine  10 mg Oral TID WC   multivitamin  1 tablet Oral QHS   pantoprazole (PROTONIX) IV  40 mg Intravenous Q12H   rOPINIRole  1.5 mg Oral QHS   vancomycin  125 mg Oral QID    acetaminophen, fluticasone, melatonin, ondansetron (ZOFRAN) IV  Assessment/ Plan:  Brent Glover is a 85 y.o. white male with end stage renal disease on hemodialysis, hypotension, coronary artery disease status post CABG, hyperlipidemia, BPH, congestive heart failure, COPD, AAA status post stent, peptic ulcer disease, left warthin tumor who is admitted to [facility] on 11/11/2020 for GI bleeding [K92.2] Hypotension, unspecified hypotension type [I95.9] Gastrointestinal hemorrhage, unspecified gastrointestinal hemorrhage type [K92.2]  [facility] TTS [facility] [address] Left AVF 72kg  End Stage Renal Disease: last hemodialysis treatment was yesterday, 10/6, as an outpatient. No indication for dialysis today.  - Receiving dialysis at this time, tolerating well - Next treatment scheduled for Tuesday  Hypotension: 114/42 during dialysis. Driven by anemia.  - Continue midodrine.   Anemia with chronic kidney disease: and GI bleed. Hemoglobin 8.2 macrocytic. Baseline hemoglobin of 10.9 on 10/14/20.  - Mircera as outpatient.  - appreciate GI input.   Secondary Hyperparathyroidism:  - Calcium acetate with meals.   5. C-diff positive. Vancomycin po per GI   LOS: 2   10/8/202210:45 AM

## 2020-11-13 NOTE — Evaluation (Signed)
Physical Therapy Evaluation Patient Details Name: Brent Glover MRN: [id number] DOB: 1935-03-23 Today's Date: 11/13/2020  History of Present Illness  Pt is an 85 y/o M admitted on 11/11/20 with c/c of diarrhea with melena. Pt is being treated for GI bleeding and anemia due to blood loss and hx of anemia in ESRD. Pt also (+) for C. Diff. PMH: ESRD on HD (TTS), HTN, HLD, diet controlled DM, COPD, GERD, sCHF with EF 35-40%, depression, CAD, CABG, anemia, GI bleeding, thrombocytopenia  Clinical Impression  Pt seen for PT evaluation with pt reporting he was ambulatory with use of QC or rollator prior to admission & only ambulated household distances. On this date, pt is able to ambulate increased distances (138 ft) with RW & min assist with good tolerance. Pt without c/o dizziness & BP did not drop with activity. Anticipate pt can d/c home with HHPT f/u but will continue to see pt acutely to address endurance, balance, strength, & progress gait with LRAD.        Recommendations for follow up therapy are one component of a multi-disciplinary discharge planning process, led by the attending physician.  Recommendations may be updated based on patient status, additional functional criteria and insurance authorization.  Follow Up Recommendations Home health PT;Supervision for mobility/OOB    Equipment Recommendations  Rolling walker with 5" wheels    Recommendations for Other Services       Precautions / Restrictions Precautions Precautions: Fall Restrictions Weight Bearing Restrictions: No      Mobility  Bed Mobility Overal bed mobility: Needs Assistance Bed Mobility: Supine to Sit     Supine to sit: Modified independent (Device/Increase time);HOB elevated     General bed mobility comments: bed rails    Transfers Overall transfer level: Needs assistance Equipment used: Rolling walker (2 wheeled) Transfers: Sit to/from [facility] Sit to Stand: Supervision Stand  pivot transfers: Min guard       General transfer comment: cuing for safe hand placement on stable surface for sit<>stand  Ambulation/Gait Ambulation/Gait assistance: Min assist Gait Distance (Feet): 138 Feet Assistive device: Rolling walker (2 wheeled) Gait Pattern/deviations: Decreased step length - right;Decreased step length - left;Decreased stride length;Trunk flexed Gait velocity: decreased   General Gait Details: pushes walker slightly out in front of him  Stairs            Wheelchair Mobility    Modified Rankin (Stroke Patients Only)       Balance Overall balance assessment: Needs assistance Sitting-balance support: No upper extremity supported;Feet supported Sitting balance-Leahy Scale: Good     Standing balance support: Bilateral upper extremity supported;During functional activity Standing balance-Leahy Scale: Fair                               Pertinent Vitals/Pain Pain Assessment: No/denies pain    Home Living Family/patient expects to be discharged to:: Private residence Living Arrangements: Spouse/significant other;Other relatives (wife & grandson) Available Help at Discharge: Family Type of Home: House Home Access: Stairs to enter Entrance Stairs-Rails: Right Entrance Stairs-Number of Steps: 3 Home Layout: One level Home Equipment: Cane - quad (rollator)      Prior Function Level of Independence: Independent with assistive device(s)         Comments: Pt reports he was ambulatory within the house with QC, in the community used a rollator. Reports 1 fall prior to admission (~2 weeks ago fell over laundry basket) & pt still  with bruising to L side of neck/face. Pt reports he can only ambulate ~50 ft at a time, but can walk 158f if he pushes it.     Hand Dominance        Extremity/Trunk Assessment   Upper Extremity Assessment Upper Extremity Assessment: Generalized weakness    Lower Extremity Assessment Lower  Extremity Assessment: Generalized weakness (pt endorses arthritic knees, appears to have BLE genu varus)    Cervical / Trunk Assessment Cervical / Trunk Assessment: Kyphotic  Communication   Communication: HOH  Cognition Arousal/Alertness: Awake/alert Behavior During Therapy: WFL for tasks assessed/performed Overall Cognitive Status: Within Functional Limits for tasks assessed                                 General Comments: Very pleasant gentleman      General Comments General comments (skin integrity, edema, etc.): BP sitting in recliner after transferring in RUE: 116/46 mmHg (MAP 67), sitting in recliner after gait in RUE: 129/61 mmHg (MAP 81)    Exercises     Assessment/Plan    PT Assessment Patient needs continued PT services  PT Problem List Decreased strength;Decreased mobility;Decreased safety awareness;Decreased activity tolerance;Decreased balance;Decreased knowledge of use of DME       PT Treatment Interventions DME instruction;Therapeutic exercise;Gait training;Balance training;Stair training;Neuromuscular re-education;Functional mobility training;Therapeutic activities;Patient/family education    PT Goals (Current goals can be found in the Care Plan section)  Acute Rehab PT Goals Patient Stated Goal: get better PT Goal Formulation: With patient Time For Goal Achievement: 11/27/20 Potential to Achieve Goals: Good    Frequency Min 2X/week   Barriers to discharge        Co-evaluation               AM-PAC PT "6 Clicks" Mobility  Outcome Measure Help needed turning from your back to your side while in a flat bed without using bedrails?: None Help needed moving from lying on your back to sitting on the side of a flat bed without using bedrails?: A Little Help needed moving to and from a bed to a chair (including a wheelchair)?: A Little Help needed standing up from a chair using your arms (e.g., wheelchair or bedside chair)?: A  Little Help needed to walk in hospital room?: A Little Help needed climbing 3-5 steps with a railing? : A Little 6 Click Score: 19    End of Session   Activity Tolerance: Patient tolerated treatment well Patient left: in chair;with chair alarm set;with call bell/phone within reach (set up with meal tray) Nurse Communication: Mobility status PT Visit Diagnosis: Unsteadiness on feet (R26.81);Muscle weakness (generalized) (M62.81);History of falling (Z91.81)    Time: [id number] Time Calculation (min) (ACUTE ONLY): 18 min   Charges:   PT Evaluation $PT Eval Low Complexity: 1[facility] PT, DPT 11/13/20, 2:47 PM   VWaunita Schooner10/09/2020, 2:46 PM

## 2020-11-13 NOTE — Progress Notes (Signed)
OT Cancellation Note  Patient Details Name: Brent Glover MRN: [id number] DOB: 08/19/1935   Cancelled Treatment:    Reason Eval/Treat Not Completed: Patient at procedure or test/ unavailable (Pt. is receiving Hemodialysis. Will reattempt OT initial evaluation at a later time, or date.)  Harrel Carina, MS, OTR/L 11/13/2020, 10:06 AM

## 2020-11-13 NOTE — Progress Notes (Signed)
PROGRESS NOTE    Brent Glover  [id number] DOB: 05-13-1935 DOA: 11/11/2020 PCP: Christen Bame, DO   Assessment & Plan:   Principal Problem:   GI bleeding Active Problems:   Hypertension   Hypercholesterolemia   Anemia due to blood loss   ESRD (end stage renal disease) ([name])   Chronic systolic CHF (congestive heart failure) ([name])   Anemia in ESRD (end-stage renal disease) ([name])   CAD (coronary artery disease)   Elevated troponin   Hypotension   Bradycardia   Thrombocytopenia ([name])   Depression   GI bleeding: continue on IV PPI. S/p 1 unit of pRBCs transfused. H&H are stable. No dark or black stools so far today.  Zofran prn. No NSAIDs. No EGD as c. diff came back positive as per GI. GI following peripherally as per GI   Acute blood loss anemia: w/ likely hx of macrocytic anemia vs ACD. Will check B12 & folate levels. S/p 1 unit of pRBCs transfused. Will continue to monitor H&H   C. diff positive: continue on po vanco x 10 days total as per GI    Hypotension: resolved    Hx of HTN: continue to hold home anti-HTN meds    HLD:  continue on statin   ESRD: on HD TTS. Nephro managing    Chronic systolic CHF: appears compensated. Echo on 09/30/2017 showed EF of 35-40% with grade 1 diastolic dysfunction. Volume management w/ HD   Hx of CAD: w/ minimally elevated troponins. No chest pain, likely due to nonspecific elevation or decreased clearance in the setting of ESRD   Bradycardia: hold metoprolol. Continue on tele   Thrombocytopenia: chronic   Depression: severity unknown. Continue on home dose of lexapro    DVT prophylaxis: SCDs Code Status: full  Family Communication: called pt's daughter, Pam, but no answer  Disposition Plan: depends on PT/OT recs   Level of care: Med-Surg  Status is: Inpatient  Remains inpatient appropriate because:Unsafe d/c plan and Inpatient level of care appropriate due to severity of illness  Dispo: The patient is from:  Home              Anticipated d/c is to: Home              Patient currently is not medically stable to d/c.   Difficult to place patient : unclear    Consultants:  GI  Procedures:   Antimicrobials: po vanco    Subjective: Pt c/o fatigue   Objective: Vitals:   11/12/20 2035 11/13/20 0419 11/13/20 0551 11/13/20 0736  BP: (!) 119/52 (!) 118/52 122/76 (!) 115/47  Pulse: 62 62 60 78  Resp: '18 20 18 20  '$ Temp: 98 F (36.7 C) 97.7 F (36.5 C) 98.2 F (36.8 C) 98 F (36.7 C)  TempSrc: Oral Oral Oral Oral  SpO2: 98% 95% 95% 96%  Weight:      Height:        Intake/Output Summary (Last 24 hours) at 11/13/2020 0815 Last data filed at 11/13/2020 0600 Gross per 24 hour  Intake --  Output 2 ml  Net -2 ml   Filed Weights   11/11/20 1358  Weight: 74.4 kg    Examination:  General exam: Appears calm but uncomfortable  Respiratory system: clear breath sounds b/l  Cardiovascular system: S1/S2+. No rubs or clicks   Gastrointestinal system: Abd is soft, NT, ND & hypoactive bowel sounds  Central nervous system: Alert and oriented. Moves all extremities  Psychiatry: judgement and insight appear  normal. Flat mood and affect  Skin: bruises over face & b/l UE (POA)    Data Reviewed: I have personally reviewed following labs and imaging studies  CBC: Recent Labs  Lab 11/12/20 0640 11/12/20 1125 11/12/20 1515 11/12/20 1959 11/13/20 0358  WBC 5.8 6.1 6.0 6.4 6.2  HGB 7.9* 8.3* 8.4* 8.3* 8.2*  HCT 23.9* 25.4* 25.5* 25.4* 25.7*  MCV 100.4* 101.6* 102.0* 101.2* 100.0  PLT 86* 97* 92* 96* [id number]*   Basic Metabolic Panel: Recent Labs  Lab 11/11/20 1351 11/12/20 0640  NA 135 136  K 3.5 3.9  CL 93* 95*  CO2 29 30  GLUCOSE 86 80  BUN 31* 40*  CREATININE 2.35* 3.49*  CALCIUM 8.2* 8.3*   GFR: Estimated Creatinine Clearance: 15.3 mL/min (A) (by C-G formula based on SCr of 3.49 mg/dL (H)). Liver Function Tests: Recent Labs  Lab 11/11/20 1351  AST 46*  ALT 22  ALKPHOS  161*  BILITOT 0.9  PROT 6.1*  ALBUMIN 2.9*   No results for input(s): LIPASE, AMYLASE in the last 168 hours. No results for input(s): AMMONIA in the last 168 hours. Coagulation Profile: Recent Labs  Lab 11/11/20 1621  INR 1.4*   Cardiac Enzymes: No results for input(s): CKTOTAL, CKMB, CKMBINDEX, TROPONINI in the last 168 hours. BNP (last 3 results) No results for input(s): PROBNP in the last 8760 hours. HbA1C: Recent Labs    11/11/20 1539  HGBA1C 5.2   CBG: Recent Labs  Lab 11/12/20 0840 11/13/20 0734  GLUCAP 76 83   Lipid Profile: Recent Labs    11/12/20 0640  CHOL 70  HDL 20*  LDLCALC 30  TRIG 102  CHOLHDL 3.5   Thyroid Function Tests: Recent Labs    11/12/20 0640  TSH 22.283*   Anemia Panel: Recent Labs    11/11/20 1351 11/11/20 1539  FERRITIN  --  437*  TIBC 258  --   IRON 75  --    Sepsis Labs: No results for input(s): PROCALCITON, LATICACIDVEN in the last 168 hours.  Recent Results (from the past 240 hour(s))  Resp Panel by RT-PCR (Flu A&B, Covid) Nasopharyngeal Swab     Status: None   Collection Time: 11/11/20  1:45 PM   Specimen: Nasopharyngeal Swab; Nasopharyngeal(NP) swabs in vial transport medium  Result Value Ref Range Status   SARS Coronavirus 2 by RT PCR NEGATIVE NEGATIVE Final    Comment: (NOTE) SARS-CoV-2 target nucleic acids are NOT DETECTED.  The SARS-CoV-2 RNA is generally detectable in upper respiratory specimens during the acute phase of infection. The lowest concentration of SARS-CoV-2 viral copies this assay can detect is 138 copies/mL. A negative result does not preclude SARS-Cov-2 infection and should not be used as the sole basis for treatment or other patient management decisions. A negative result may occur with  improper specimen collection/handling, submission of specimen other than nasopharyngeal swab, presence of viral mutation(s) within the areas targeted by this assay, and inadequate number of  viral copies(<138 copies/mL). A negative result must be combined with clinical observations, patient history, and epidemiological information. The expected result is Negative.  Fact Sheet for Patients:  EntrepreneurPulse.com.au  Fact Sheet for Healthcare Providers:  IncredibleEmployment.be  This test is no t yet approved or cleared by the [place] FDA and  has been authorized for detection and/or diagnosis of SARS-CoV-2 by FDA under an Emergency Use Authorization (EUA). This EUA will remain  in effect (meaning this test can be used) for the duration of the COVID-19 declaration  under Section 564(b)(1) of the Act, 21 U.S.C.section 360bbb-3(b)(1), unless the authorization is terminated  or revoked sooner.       Influenza A by PCR NEGATIVE NEGATIVE Final   Influenza B by PCR NEGATIVE NEGATIVE Final    Comment: (NOTE) The Xpert Xpress SARS-CoV-2/FLU/RSV plus assay is intended as an aid in the diagnosis of influenza from Nasopharyngeal swab specimens and should not be used as a sole basis for treatment. Nasal washings and aspirates are unacceptable for Xpert Xpress SARS-CoV-2/FLU/RSV testing.  Fact Sheet for Patients: EntrepreneurPulse.com.au  Fact Sheet for Healthcare Providers: IncredibleEmployment.be  This test is not yet approved or cleared by the [place] FDA and has been authorized for detection and/or diagnosis of SARS-CoV-2 by FDA under an Emergency Use Authorization (EUA). This EUA will remain in effect (meaning this test can be used) for the duration of the COVID-19 declaration under Section 564(b)(1) of the Act, 21 U.S.C. section 360bbb-3(b)(1), unless the authorization is terminated or revoked.  Performed at [facility], [place], [place] [address]   C Difficile Quick Screen w PCR reflex     Status: Abnormal   Collection Time: 11/12/20 10:57 AM   Specimen:  STOOL  Result Value Ref Range Status   C Diff antigen POSITIVE (A) NEGATIVE Final   C Diff toxin POSITIVE (A) NEGATIVE Final   C Diff interpretation Toxin producing C. difficile detected.  Final    Comment: CRITICAL RESULT CALLED TO, READ BACK BY AND VERIFIED WITH: Aileen Pilot, RN [phone] 11/12/20 GM Performed at [facility], [address] [place], [place] [address]   MRSA Next Gen by PCR, Nasal     Status: None   Collection Time: 11/12/20  6:53 PM   Specimen: Nasal Mucosa; Nasal Swab  Result Value Ref Range Status   MRSA by PCR Next Gen NOT DETECTED NOT DETECTED Final    Comment: (NOTE) The GeneXpert MRSA Assay (FDA approved for NASAL specimens only), is one component of a comprehensive MRSA colonization surveillance program. It is not intended to diagnose MRSA infection nor to guide or monitor treatment for MRSA infections. Test performance is not FDA approved in patients less than 74 years old. Performed at [facility], [address] [place], [place] [address]          Radiology Studies: DG Chest Portable 1 View  Result Date: 11/11/2020 CLINICAL DATA:  Shortness of breath and weakness. EXAM: PORTABLE CHEST 1 VIEW COMPARISON:  07/10/2020 FINDINGS: Previous median sternotomy and CABG. Chronic cardiomegaly and aortic atherosclerosis. Pulmonary venous hypertension with mild interstitial edema. No effusions. No focal pulmonary finding. IMPRESSION: Congestive heart failure. Cardiomegaly. Pulmonary venous hypertension. Mild interstitial edema. Electronically Signed   By: Nelson Chimes M.D.   On: 11/11/2020 14:33        Scheduled Meds:  atorvastatin  80 mg Oral Daily   calcium acetate  1,334 mg Oral TID WC   escitalopram  20 mg Oral Daily   loratadine  10 mg Oral Daily   midodrine  10 mg Oral TID WC   multivitamin  1 tablet Oral QHS   pantoprazole (PROTONIX) IV  40 mg Intravenous Q12H   rOPINIRole  1.5 mg Oral QHS   vancomycin  125 mg Oral QID    Continuous Infusions:   LOS: 2 days    Time spent: 25 mins     Wyvonnia Dusky, MD [facility] Pager [phone]  If 7PM-7AM, please contact night-coverage 11/13/2020, 8:15 AM

## 2020-11-13 NOTE — TOC Initial Note (Signed)
Transition of Care [facility]) - Initial/Assessment Note    Patient Details  Name: Brent Glover MRN: [id number] Date of Birth: 27-May-1935  [facility] of [facility]) CM/SW Contact:    Magnus Ivan, LCSW Phone Number: 11/13/2020, 4:05 PM  Clinical Narrative:                Spoke with patient via phone due to Enteric Isolation. Patient lives with wife. Drives himself to appointments. PCP is Dr. Truman Hayward. Pharmacy is [facility] in [place]. Confirmed home address on chart. Patient has a RW, cane, w/c, and shower chair. Patient had [facility] in the past, couldn't recall agency name. Goes to Dialysis on [address]. Patient agreeable to [facility], no agency preference. Referral for RN and PT accepted by Sharmon Revere with [facility].    Expected Discharge Plan: [place] Barriers to Discharge: Continued Medical Work up   Patient Goals and CMS Choice Patient states their goals for this hospitalization and ongoing recovery are:: home with home health CMS Medicare.gov Compare Post Acute Care list provided to:: Patient Choice offered to / list presented to : Patient  Expected Discharge Plan and Services Expected Discharge Plan: [place]       Living arrangements for the past 2 months: Single Family Home                           HH Arranged: PT, RN [facility] Agency: [place] Date [facility] Agency Contacted: 11/13/20   Representative spoke with at [facility]: [name]  Prior Living Arrangements/Services Living arrangements for the past 2 months: [place] Lives with:: Spouse, Relatives Patient language and need for interpreter reviewed:: Yes Do you feel safe going back to the place where you live?: Yes      Need for Family Participation in Patient Care: Yes (Comment) Care giver support system in place?: Yes (comment) Current home services: DME Criminal Activity/Legal Involvement Pertinent to Current Situation/Hospitalization: No -  Comment as needed  Activities of Daily Living Home Assistive Devices/Equipment: None ADL Screening (condition at time of admission) Patient's cognitive ability adequate to safely complete daily activities?: Yes Is the patient deaf or have difficulty hearing?: Yes Does the patient have difficulty seeing, even when wearing glasses/contacts?: No Does the patient have difficulty concentrating, remembering, or making decisions?: No Patient able to express need for assistance with ADLs?: Yes Does the patient have difficulty dressing or bathing?: No Independently performs ADLs?: Yes (appropriate for developmental age) Does the patient have difficulty walking or climbing stairs?: No Weakness of Legs: None Weakness of Arms/Hands: None  Permission Sought/Granted Permission sought to share information with : Chartered certified accountant granted to share information with : Yes, Verbal Permission Granted     Permission granted to share info w AGENCY: [place] agencies, DME agencies if needed        Emotional Assessment       Orientation: : Oriented to Self, Oriented to Place, Oriented to  Time, Oriented to Situation Alcohol / Substance Use: Not Applicable Psych Involvement: No (comment)  Admission diagnosis:  GI bleeding [K92.2] Hypotension, unspecified hypotension type [I95.9] Gastrointestinal hemorrhage, unspecified gastrointestinal hemorrhage type [K92.2] Patient Active Problem List   Diagnosis Date Noted   GI bleeding 11/11/2020   Type II diabetes mellitus with renal manifestations ([place]) 11/11/2020   Depression 11/11/2020   ESRD (end stage renal disease) ([name])    Chronic systolic CHF (congestive  heart failure) ([name])    Anemia in ESRD (end-stage renal disease) ([name])    CAD (coronary artery disease)    Elevated troponin    Hypotension    Bradycardia    Thrombocytopenia ([name])    Anemia due to blood loss    Acute on chronic anemia 09/30/2017   Syncope and collapse  09/29/2017   Chronic kidney disease    CAD in native artery 11/23/2014   H/O coronary artery bypass surgery 10/28/2014   Acute non-ST elevation myocardial infarction (NSTEMI) ([place]) 10/15/2014   Chronic kidney disease (CKD), stage III (moderate) ([name]) 03/17/2014   Creatinine elevation 03/11/2014   Chronic combined systolic and diastolic heart failure ([place]) 11/29/2012   Claudication ([place]) 06/25/2012   Chronic diastolic heart failure ([place])    Hypertension    Type 2 diabetes mellitus ([place]) 09/21/2011   Aneurysm ([place]) 10/07/2010   Arthritis, degenerative 09/09/2010   Hypercholesterolemia 04/04/2009   Benign hypertension 09/04/2008   PCP:  Christen Bame, DO Pharmacy:   [facility], [place] [address] Phone: [phone] Fax: [phone]     Social Determinants of Health (SDOH) Interventions    Readmission Risk Interventions Readmission Risk Prevention Plan 11/13/2020  Transportation Screening Complete  PCP or Specialist Appt within 5-7 Days Complete  Home Care Screening Complete  Medication Review (RN CM) Complete  Some recent data might be hidden

## 2020-11-14 LAB — CBC
HCT: 27.3 % — ABNORMAL LOW (ref 39.0–52.0)
Hemoglobin: 8.8 g/dL — ABNORMAL LOW (ref 13.0–17.0)
MCH: 33.3 pg (ref 26.0–34.0)
MCHC: 32.2 g/dL (ref 30.0–36.0)
MCV: 103.4 fL — ABNORMAL HIGH (ref 80.0–100.0)
Platelets: 92 10*3/uL — ABNORMAL LOW (ref 150–400)
RBC: 2.64 MIL/uL — ABNORMAL LOW (ref 4.22–5.81)
RDW: 19.7 % — ABNORMAL HIGH (ref 11.5–15.5)
WBC: 6.3 10*3/uL (ref 4.0–10.5)
nRBC: 0 % (ref 0.0–0.2)

## 2020-11-14 LAB — BASIC METABOLIC PANEL
Anion gap: 11 (ref 5–15)
BUN: 27 mg/dL — ABNORMAL HIGH (ref 8–23)
CO2: 27 mmol/L (ref 22–32)
Calcium: 8.2 mg/dL — ABNORMAL LOW (ref 8.9–10.3)
Chloride: 97 mmol/L — ABNORMAL LOW (ref 98–111)
Creatinine, Ser: 3.56 mg/dL — ABNORMAL HIGH (ref 0.61–1.24)
GFR, Estimated: 16 mL/min — ABNORMAL LOW (ref >60)
Glucose, Bld: 95 mg/dL (ref 70–99)
Potassium: 3.6 mmol/L (ref 3.5–5.1)
Sodium: 135 mmol/L (ref 135–145)

## 2020-11-14 LAB — GLUCOSE, CAPILLARY: Glucose-Capillary: 101 mg/dL — ABNORMAL HIGH (ref 70–99)

## 2020-11-14 LAB — FOLATE: Folate: 51 ng/mL (ref >5.9)

## 2020-11-14 LAB — HEPATITIS B SURFACE ANTIBODY,QUALITATIVE: Hep B S Ab: REACTIVE — AB

## 2020-11-14 LAB — VITAMIN B12: Vitamin B-12: 1000 pg/mL — ABNORMAL HIGH (ref 180–914)

## 2020-11-14 NOTE — Evaluation (Signed)
Occupational Therapy Evaluation Patient Details Name: Brent Glover MRN: [id number] DOB: 07-Nov-1935 Today's Date: 11/14/2020   History of Present Illness Pt is an 85 y/o M admitted on 11/11/20 with c/c of diarrhea with melena. Pt is being treated for GI bleeding and anemia due to blood loss and hx of anemia in ESRD. Pt also (+) for C. Diff. PMH: ESRD on HD (TTS), HTN, HLD, diet controlled DM, COPD, GERD, sCHF with EF 35-40%, depression, CAD, CABG, anemia, GI bleeding, thrombocytopenia   Clinical Impression   Pt seen for OT evaluation this date. Prior to admission, pt was mod-independent in functional mobility (with QC/rollator) and ADLs, living in a 1-story home with wife and grandson. Pt reports that he independently performs IADLs and drives at baseline. Pt reports multiple falls prior to admission (with one most recently 2 weeks ago resulting in bruising to L side of neck/face). Pt currently presents with decreased balance, decreased safety awareness, and decreased activity tolerance. Due to these current functional impairments, pt requires SUPERVISION for seated LB dressing, MIN GUARD-MIN A for functional mobility of short household distances with RW, MIN GUARD for toilet transfers/clothing management, and SUPERVISION for standing grooming tasks. Pt denied dizziness throughout session. Pt would benefit from additional skilled OT services to maximize return to PLOF and minimize risk of future falls, injury, caregiver burden, and readmission. Upon discharge, recommend [facility] services.       Recommendations for follow up therapy are one component of a multi-disciplinary discharge planning process, led by the attending physician.  Recommendations may be updated based on patient status, additional functional criteria and insurance authorization.   Follow Up Recommendations  Home health OT    Equipment Recommendations  None recommended by OT       Precautions / Restrictions  Precautions Precautions: Fall Restrictions Weight Bearing Restrictions: No      Mobility Bed Mobility               General bed mobility comments: not assessed, in recliner at beginning/end of session    Transfers Overall transfer level: Needs assistance Equipment used: Rolling walker (2 wheeled) Transfers: Sit to/from Stand Sit to Stand: Supervision              Balance Overall balance assessment: Needs assistance Sitting-balance support: No upper extremity supported;Feet supported Sitting balance-Leahy Scale: Good Sitting balance - Comments: good sitting balance reaching within BOS   Standing balance support: Bilateral upper extremity supported;During functional activity Standing balance-Leahy Scale: Poor Standing balance comment: x2 LOB during functional mobility requiring MIN A to steady. MIN GUARD at all other times                           ADL either performed or assessed with clinical judgement   ADL Overall ADL's : Needs assistance/impaired     Grooming: Wash/dry hands;Supervision/safety;Standing               Lower Body Dressing: Supervision/safety;Sitting/lateral leans Lower Body Dressing Details (indicate cue type and reason): to don/doff socks Toilet Transfer: Supervision/safety;BSC;RW   Toileting- Water quality scientist and Hygiene: Min guard;Sit to/from stand Toileting - Clothing Manipulation Details (indicate cue type and reason): MIN GUARD to don/doff underwear     Functional mobility during ADLs: Min guard;Minimal assistance;Cueing for safety;Rolling walker        Pertinent Vitals/Pain Pain Assessment: No/denies pain     Hand Dominance Right   Extremity/Trunk Assessment Upper Extremity Assessment Upper Extremity Assessment: Generalized weakness  Lower Extremity Assessment Lower Extremity Assessment: Generalized weakness       Communication Communication Communication: HOH   Cognition Arousal/Alertness:  Awake/alert Behavior During Therapy: WFL for tasks assessed/performed Overall Cognitive Status: Within Functional Limits for tasks assessed                                 General Comments: Alert and oriented to self, place, date, and some aspects of situation. Pleasant and agreeable throughout              [place] expects to be discharged to:: Private residence Living Arrangements: Spouse/significant other;Other relatives;Other (Comment) (grandson) Available Help at Discharge: Family;Available 24 hours/day Type of Home: House Home Access: Stairs to enter [employer] of Steps: 3 (from garage) Entrance Stairs-Rails: Right Home Layout: One level     Bathroom Shower/Tub: Walk-in shower         Home Equipment: Cane - quad;Shower seat;Grab bars - tub/shower;Hand held shower head;Wheelchair - [employer] - 4 wheels;Bedside commode          Prior Functioning/Environment          Comments: Pt reports he was ambulatory within the house with QC, in the community used a rollator. Reports multiple falls prior to admission (with one most recently 2 weeks ago resulting in bruising to L side of neck/face). Pt reports he only ambulates ~50 ft at a time, but can walk 177f if he pushes it. Independent with ADLs and IADLs. Pt reports that he drives        OT Problem List: Decreased strength;Impaired balance (sitting and/or standing);Decreased activity tolerance;Decreased safety awareness;Decreased knowledge of use of DME or AE      OT Treatment/Interventions: Self-care/ADL training;Therapeutic exercise;DME and/or AE instruction;Energy conservation;Therapeutic activities;Patient/family education;Balance training    OT Goals(Current goals can be found in the care plan section) Acute Rehab OT Goals Patient Stated Goal: get better OT Goal Formulation: With patient Time For Goal Achievement: 11/28/20 Potential to Achieve Goals: Good ADL  Goals Pt Will Perform Grooming: with modified independence;standing Pt Will Perform Lower Body Bathing: with modified independence;sit to/from stand Pt Will Transfer to Toilet: with modified independence;ambulating;regular height toilet  OT Frequency: Min 1X/week    AM-PAC OT "6 Clicks" Daily Activity     Outcome Measure Help from another person eating meals?: None Help from another person taking care of personal grooming?: A Little Help from another person toileting, which includes using toliet, bedpan, or urinal?: A Little Help from another person bathing (including washing, rinsing, drying)?: A Little Help from another person to put on and taking off regular upper body clothing?: None Help from another person to put on and taking off regular lower body clothing?: A Little 6 Click Score: 20   End of Session Equipment Utilized During Treatment: Rolling walker Nurse Communication: Mobility status  Activity Tolerance: Patient tolerated treatment well Patient left: in chair;with call bell/phone within reach;with chair alarm set  OT Visit Diagnosis: Unsteadiness on feet (R26.81);History of falling (Z91.81)                Time: [id number] Time Calculation (min): 23 min Charges:  OT General Charges $OT Visit: 1 Visit OT Evaluation $OT Eval Moderate Complexity: 1 Mod OT Treatments $Self Care/Home Management : 8-22 mins  LFredirick Maudlin OTR/L [place]

## 2020-11-14 NOTE — Plan of Care (Signed)
Patient is A/O X 4. Patient did not report having any pain during my shift. Patient did have a small Brent Glover liquid bowel movement. Patients blood pressure has been low but this seems to be his baseline. Patient is asymptomatic. No further needs to address at this time. Will continue to monitor.   Problem: Education: Goal: Ability to identify signs and symptoms of gastrointestinal bleeding will improve Outcome: Progressing   Problem: Bowel/Gastric: Goal: Will show no signs and symptoms of gastrointestinal bleeding Outcome: Progressing   Problem: Education: Goal: Knowledge of General Education information will improve Description: Including pain rating scale, medication(s)/side effects and non-pharmacologic comfort measures Outcome: Progressing   Problem: Pain Managment: Goal: General experience of comfort will improve Outcome: Progressing   Problem: Clinical Measurements: Goal: Respiratory complications will improve Outcome: Progressing

## 2020-11-14 NOTE — Plan of Care (Signed)
  Problem: Education: Goal: Ability to identify signs and symptoms of gastrointestinal bleeding will improve Outcome: Progressing   Problem: Bowel/Gastric: Goal: Will show no signs and symptoms of gastrointestinal bleeding Outcome: Progressing   Problem: Fluid Volume: Goal: Will show no signs and symptoms of excessive bleeding Outcome: Progressing   Problem: Clinical Measurements: Goal: Complications related to the disease process, condition or treatment will be avoided or minimized Outcome: Progressing   Problem: Education: Goal: Knowledge of General Education information will improve Description: Including pain rating scale, medication(s)/side effects and non-pharmacologic comfort measures Outcome: Progressing   Problem: Clinical Measurements: Goal: Ability to maintain clinical measurements within normal limits will improve Outcome: Progressing Goal: Will remain free from infection Outcome: Progressing Goal: Diagnostic test results will improve Outcome: Progressing Goal: Respiratory complications will improve Outcome: Progressing Goal: Cardiovascular complication will be avoided Outcome: Progressing   Problem: Pain Managment: Goal: General experience of comfort will improve Outcome: Progressing   Problem: Safety: Goal: Ability to remain free from injury will improve Outcome: Progressing

## 2020-11-14 NOTE — Progress Notes (Signed)
[facility]  ROUNDING NOTE   Subjective:   Brent Glover was admitted to [facility] on 11/11/2020 for GI bleeding [K92.2] Hypotension, unspecified hypotension type [I95.9] Gastrointestinal hemorrhage, unspecified gastrointestinal hemorrhage type [K92.2]  Last hemodialysis treatment was yesterday.   Patient tolerated treatment well. Patient states the day after dialysis, he has difficulty hearing. He must be spoken to loudly this morning.   Patient states his bowel movements are more formed this morning.    Objective:  Vital signs in last 24 hours:  Temp:  [98 F (36.7 C)-98.9 F (37.2 C)] 98.9 F (37.2 C) (10/09 0800) Pulse Rate:  [58-86] 66 (10/09 0800) Resp:  [16-23] 17 (10/09 0800) BP: (98-125)/(41-76) 114/57 (10/09 0800) SpO2:  [92 %-96 %] 94 % (10/09 0800)  Weight change:  Filed Weights   11/11/20 1358  Weight: 74.4 kg    Intake/Output: I/O last 3 completed shifts: In: 88.1 [P.O.:60; I.V.:28.1] Out: 1001 [Other:1000; Stool:1]   Intake/Output this shift:  No intake/output data recorded.  Physical Exam: General: NAD, sitting in chair  Head: Normocephalic, atraumatic. Moist oral mucosal membranes  Eyes: Anicteric  Lungs:  Clear to auscultation, normal effort  Heart: Regular rate and rhythm  Abdomen:  Soft, nontender  Extremities:  no peripheral edema.  Neurologic: Nonfocal, moving all four extremities  Skin: +ecchymosis   Access: Left AVF    Basic Metabolic Panel: Recent Labs  Lab 11/11/20 1351 11/12/20 0640 11/13/20 0939 11/14/20 0422  NA 135 136 133* 135  K 3.5 3.9 4.0 3.6  CL 93* 95* 94* 97*  CO2 '29 30 26 27  '$ GLUCOSE 86 80 86 95  BUN 31* 40* 57* 27*  CREATININE 2.35* 3.49* 5.22* 3.56*  CALCIUM 8.2* 8.3* 8.9 8.2*     Liver Function Tests: Recent Labs  Lab 11/11/20 1351  AST 46*  ALT 22  ALKPHOS 161*  BILITOT 0.9  PROT 6.1*  ALBUMIN 2.9*    No results for input(s): LIPASE, AMYLASE in the last 168 hours. No results  for input(s): AMMONIA in the last 168 hours.  CBC: Recent Labs  Lab 11/12/20 1515 11/12/20 1959 11/13/20 0358 11/13/20 0939 11/14/20 0422  WBC 6.0 6.4 6.2 5.6 6.3  HGB 8.4* 8.3* 8.2* 8.3* 8.8*  HCT 25.5* 25.4* 25.7* 25.3* 27.3*  MCV 102.0* 101.2* 100.0 98.8 103.4*  PLT 92* 96* 101* 100* 92*     Cardiac Enzymes: No results for input(s): CKTOTAL, CKMB, CKMBINDEX, TROPONINI in the last 168 hours.  BNP: Invalid input(s): POCBNP  CBG: Recent Labs  Lab 11/12/20 0840 11/13/20 0734 11/14/20 0801  GLUCAP 76 13 101*     Microbiology: Results for orders placed or performed during the hospital encounter of 11/11/20  Resp Panel by RT-PCR (Flu A&B, Covid) Nasopharyngeal Swab     Status: None   Collection Time: 11/11/20  1:45 PM   Specimen: Nasopharyngeal Swab; Nasopharyngeal(NP) swabs in vial transport medium  Result Value Ref Range Status   SARS Coronavirus 2 by RT PCR NEGATIVE NEGATIVE Final    Comment: (NOTE) SARS-CoV-2 target nucleic acids are NOT DETECTED.  The SARS-CoV-2 RNA is generally detectable in upper respiratory specimens during the acute phase of infection. The lowest concentration of SARS-CoV-2 viral copies this assay can detect is 138 copies/mL. A negative result does not preclude SARS-Cov-2 infection and should not be used as the sole basis for treatment or other patient management decisions. A negative result may occur with  improper specimen collection/handling, submission of specimen other than nasopharyngeal swab,  presence of viral mutation(s) within the areas targeted by this assay, and inadequate number of viral copies(<138 copies/mL). A negative result must be combined with clinical observations, patient history, and epidemiological information. The expected result is Negative.  Fact Sheet for Patients:  EntrepreneurPulse.com.au  Fact Sheet for Healthcare Providers:  IncredibleEmployment.be  This test is no t  yet approved or cleared by the [place] FDA and  has been authorized for detection and/or diagnosis of SARS-CoV-2 by FDA under an Emergency Use Authorization (EUA). This EUA will remain  in effect (meaning this test can be used) for the duration of the COVID-19 declaration under Section 564(b)(1) of the Act, 21 U.S.C.section 360bbb-3(b)(1), unless the authorization is terminated  or revoked sooner.       Influenza A by PCR NEGATIVE NEGATIVE Final   Influenza B by PCR NEGATIVE NEGATIVE Final    Comment: (NOTE) The Xpert Xpress SARS-CoV-2/FLU/RSV plus assay is intended as an aid in the diagnosis of influenza from Nasopharyngeal swab specimens and should not be used as a sole basis for treatment. Nasal washings and aspirates are unacceptable for Xpert Xpress SARS-CoV-2/FLU/RSV testing.  Fact Sheet for Patients: EntrepreneurPulse.com.au  Fact Sheet for Healthcare Providers: IncredibleEmployment.be  This test is not yet approved or cleared by the [place] FDA and has been authorized for detection and/or diagnosis of SARS-CoV-2 by FDA under an Emergency Use Authorization (EUA). This EUA will remain in effect (meaning this test can be used) for the duration of the COVID-19 declaration under Section 564(b)(1) of the Act, 21 U.S.C. section 360bbb-3(b)(1), unless the authorization is terminated or revoked.  Performed at [facility], [place], [place] [address]   C Difficile Quick Screen w PCR reflex     Status: Abnormal   Collection Time: 11/12/20 10:57 AM   Specimen: STOOL  Result Value Ref Range Status   C Diff antigen POSITIVE (A) NEGATIVE Final   C Diff toxin POSITIVE (A) NEGATIVE Final   C Diff interpretation Toxin producing C. difficile detected.  Final    Comment: CRITICAL RESULT CALLED TO, READ BACK BY AND VERIFIED WITH: Aileen Pilot, RN [phone] 11/12/20 GM Performed at [facility], [address] [place], [place] [address]   MRSA Next Gen by PCR, Nasal     Status: None   Collection Time: 11/12/20  6:53 PM   Specimen: Nasal Mucosa; Nasal Swab  Result Value Ref Range Status   MRSA by PCR Next Gen NOT DETECTED NOT DETECTED Final    Comment: (NOTE) The GeneXpert MRSA Assay (FDA approved for NASAL specimens only), is one component of a comprehensive MRSA colonization surveillance program. It is not intended to diagnose MRSA infection nor to guide or monitor treatment for MRSA infections. Test performance is not FDA approved in patients less than 65 years old. Performed at [facility], [address] [place], [place] [address]     Coagulation Studies: Recent Labs    11/11/20 1621  LABPROT 16.9*  INR 1.4*     Urinalysis: No results for input(s): COLORURINE, LABSPEC, PHURINE, GLUCOSEU, HGBUR, BILIRUBINUR, KETONESUR, PROTEINUR, UROBILINOGEN, NITRITE, LEUKOCYTESUR in the last 72 hours.  Invalid input(s): APPERANCEUR    Imaging: No results found.   Medications:     atorvastatin  80 mg Oral Daily   calcium acetate  1,334 mg Oral TID WC   escitalopram  20 mg Oral Daily   feeding supplement (NEPRO CARB STEADY)  237 mL Oral BID BM   loratadine  10 mg Oral Daily   midodrine  10 mg Oral TID WC   multivitamin  1 tablet Oral QHS   pantoprazole (PROTONIX) IV  40 mg Intravenous Q12H   rOPINIRole  1.5 mg Oral QHS   vancomycin  125 mg Oral QID   acetaminophen, fluticasone, melatonin, ondansetron (ZOFRAN) IV  Assessment/ Plan:  Brent Glover is a 85 y.o. white male with end stage renal disease on hemodialysis, hypotension, coronary artery disease status post CABG, hyperlipidemia, BPH, congestive heart failure, COPD, AAA status post stent, peptic ulcer disease, left warthin tumor who is admitted to [facility] on 11/11/2020 for GI bleeding [K92.2] Hypotension, unspecified hypotension type [I95.9] Gastrointestinal hemorrhage, unspecified gastrointestinal  hemorrhage type [K92.2]  [facility] TTS [address] Left AVF 72kg  End Stage Renal Disease: tolerated inpatient hemodialysis treatment yesterday.  - Continue TTS schedule.   Hypotension: 114/57.   - Continue midodrine.   Anemia with chronic kidney disease: and GI bleed. Hemoglobin 8.8 macrocytic. Baseline hemoglobin of 10.9 on 10/14/20. No indication for transfusion.  - Mircera as outpatient.  - appreciate GI input.   Secondary Hyperparathyroidism:  - Calcium acetate with meals.   5.   Colitis: C-diff positive.  - PO Vancomycin     LOS: 3 Raymonda Pell 10/9/202212:59 PM

## 2020-11-14 NOTE — Progress Notes (Signed)
PROGRESS NOTE    Brent Glover  [id number] DOB: 03/30/1935 DOA: 11/11/2020 PCP: Christen Bame, DO   Assessment & Plan:   Principal Problem:   GI bleeding Active Problems:   Hypertension   Hypercholesterolemia   Anemia due to blood loss   ESRD (end stage renal disease) ([name])   Chronic systolic CHF (congestive heart failure) ([name])   Anemia in ESRD (end-stage renal disease) ([name])   CAD (coronary artery disease)   Elevated troponin   Hypotension   Bradycardia   Thrombocytopenia ([name])   Depression   GI bleeding: continue on PPI. S/p 1 unit of pRBCs transfused. H&H are stable. No NSAIDs. No EGD as c. diff came back positive as per GI. GI following peripherally as per GI.  Acute blood loss anemia: w/ likely hx of macrocytic anemia vs ACD. Folate is not low and B12 is pending still. S/p 1 unit of pRBCs transfused. Will continue to monitor H&H   C. diff positive: continue on po vanco x 10 days total as per GI    Hypotension: resolved    Hx of HTN: continue to hold anti-HTN meds as BP is WNL currently    HLD:  continue on statin   ESRD: on HD TTS. Nephro managing    Chronic systolic CHF: appears compensated. Echo on 09/30/2017 showed EF of 35-40% with grade 1 diastolic dysfunction. Volume management w/ HD   Hx of CAD: w/ minimally elevated troponins. No chest pain, likely due to nonspecific elevation or decreased clearance in the setting of ESRD   Bradycardia: continue on hold metoprolol    Thrombocytopenia: chronic and labile   Depression: severity unknown. Continue on home dose of lexapro      DVT prophylaxis: SCDs Code Status: full  Family Communication:  Disposition Plan: d/c back home w/ home health   Level of care: Med-Surg  Status is: Inpatient  Remains inpatient appropriate because:Unsafe d/c plan and Inpatient level of care appropriate due to severity of illness  Dispo: The patient is from: Home              Anticipated d/c is to: Home               Patient currently is not medically stable to d/c.   Difficult to place patient : unclear    Consultants:  GI  Procedures:   Antimicrobials: po vanco    Subjective: Pt c/o malaise   Objective: Vitals:   11/13/20 1339 11/13/20 1529 11/13/20 2027 11/14/20 0555  BP:  98/76 (!) 112/41 118/75  Pulse:  86 (!) 58 69  Resp: (!) '23 20 20 18  '$ Temp:  98 F (36.7 C) 98.1 F (36.7 C) 98.8 F (37.1 C)  TempSrc:  Oral Oral Oral  SpO2:  96% 96% 92%  Weight:      Height:        Intake/Output Summary (Last 24 hours) at 11/14/2020 0754 Last data filed at 11/13/2020 1600 Gross per 24 hour  Intake 88.14 ml  Output 1000 ml  Net -911.86 ml   Filed Weights   11/11/20 1358  Weight: 74.4 kg    Examination:  General exam: Appears lethargic Respiratory system: clear breath sounds b/l. No rales  Cardiovascular system: S1 & S2+. No rubs or gallops   Gastrointestinal system: Abd is soft, NT, ND & normal bowel sounds  Central nervous system: Lethargic but oriented. Moves all extremities  Psychiatry: judgement and insight appear normal. Flat mood and affect  Skin: bruises  over face & b/l UE (POA)    Data Reviewed: I have personally reviewed following labs and imaging studies  CBC: Recent Labs  Lab 11/12/20 1515 11/12/20 1959 11/13/20 0358 11/13/20 0939 11/14/20 0422  WBC 6.0 6.4 6.2 5.6 6.3  HGB 8.4* 8.3* 8.2* 8.3* 8.8*  HCT 25.5* 25.4* 25.7* 25.3* 27.3*  MCV 102.0* 101.2* 100.0 98.8 103.4*  PLT 92* 96* 101* 100* 92*   Basic Metabolic Panel: Recent Labs  Lab 11/11/20 1351 11/12/20 0640 11/13/20 0939 11/14/20 0422  NA 135 136 133* 135  K 3.5 3.9 4.0 3.6  CL 93* 95* 94* 97*  CO2 '29 30 26 27  '$ GLUCOSE 86 80 86 95  BUN 31* 40* 57* 27*  CREATININE 2.35* 3.49* 5.22* 3.56*  CALCIUM 8.2* 8.3* 8.9 8.2*   GFR: Estimated Creatinine Clearance: 15 mL/min (A) (by C-G formula based on SCr of 3.56 mg/dL (H)). Liver Function Tests: Recent Labs  Lab 11/11/20 1351  AST  46*  ALT 22  ALKPHOS 161*  BILITOT 0.9  PROT 6.1*  ALBUMIN 2.9*   No results for input(s): LIPASE, AMYLASE in the last 168 hours. No results for input(s): AMMONIA in the last 168 hours. Coagulation Profile: Recent Labs  Lab 11/11/20 1621  INR 1.4*   Cardiac Enzymes: No results for input(s): CKTOTAL, CKMB, CKMBINDEX, TROPONINI in the last 168 hours. BNP (last 3 results) No results for input(s): PROBNP in the last 8760 hours. HbA1C: Recent Labs    11/11/20 1539  HGBA1C 5.2   CBG: Recent Labs  Lab 11/12/20 0840 11/13/20 0734  GLUCAP 76 83   Lipid Profile: Recent Labs    11/12/20 0640  CHOL 70  HDL 20*  LDLCALC 30  TRIG 102  CHOLHDL 3.5   Thyroid Function Tests: Recent Labs    11/12/20 0640  TSH 22.283*   Anemia Panel: Recent Labs    11/11/20 1351 11/11/20 1539  FERRITIN  --  437*  TIBC 258  --   IRON 75  --    Sepsis Labs: No results for input(s): PROCALCITON, LATICACIDVEN in the last 168 hours.  Recent Results (from the past 240 hour(s))  Resp Panel by RT-PCR (Flu A&B, Covid) Nasopharyngeal Swab     Status: None   Collection Time: 11/11/20  1:45 PM   Specimen: Nasopharyngeal Swab; Nasopharyngeal(NP) swabs in vial transport medium  Result Value Ref Range Status   SARS Coronavirus 2 by RT PCR NEGATIVE NEGATIVE Final    Comment: (NOTE) SARS-CoV-2 target nucleic acids are NOT DETECTED.  The SARS-CoV-2 RNA is generally detectable in upper respiratory specimens during the acute phase of infection. The lowest concentration of SARS-CoV-2 viral copies this assay can detect is 138 copies/mL. A negative result does not preclude SARS-Cov-2 infection and should not be used as the sole basis for treatment or other patient management decisions. A negative result may occur with  improper specimen collection/handling, submission of specimen other than nasopharyngeal swab, presence of viral mutation(s) within the areas targeted by this assay, and inadequate  number of viral copies(<138 copies/mL). A negative result must be combined with clinical observations, patient history, and epidemiological information. The expected result is Negative.  Fact Sheet for Patients:  EntrepreneurPulse.com.au  Fact Sheet for Healthcare Providers:  IncredibleEmployment.be  This test is no t yet approved or cleared by the [place] FDA and  has been authorized for detection and/or diagnosis of SARS-CoV-2 by FDA under an Emergency Use Authorization (EUA). This EUA will remain  in effect (meaning  this test can be used) for the duration of the COVID-19 declaration under Section 564(b)(1) of the Act, 21 U.S.C.section 360bbb-3(b)(1), unless the authorization is terminated  or revoked sooner.       Influenza A by PCR NEGATIVE NEGATIVE Final   Influenza B by PCR NEGATIVE NEGATIVE Final    Comment: (NOTE) The Xpert Xpress SARS-CoV-2/FLU/RSV plus assay is intended as an aid in the diagnosis of influenza from Nasopharyngeal swab specimens and should not be used as a sole basis for treatment. Nasal washings and aspirates are unacceptable for Xpert Xpress SARS-CoV-2/FLU/RSV testing.  Fact Sheet for Patients: EntrepreneurPulse.com.au  Fact Sheet for Healthcare Providers: IncredibleEmployment.be  This test is not yet approved or cleared by the [place] FDA and has been authorized for detection and/or diagnosis of SARS-CoV-2 by FDA under an Emergency Use Authorization (EUA). This EUA will remain in effect (meaning this test can be used) for the duration of the COVID-19 declaration under Section 564(b)(1) of the Act, 21 U.S.C. section 360bbb-3(b)(1), unless the authorization is terminated or revoked.  Performed at [facility], [place], [place] [address]   C Difficile Quick Screen w PCR reflex     Status: Abnormal   Collection Time: 11/12/20 10:57 AM    Specimen: STOOL  Result Value Ref Range Status   C Diff antigen POSITIVE (A) NEGATIVE Final   C Diff toxin POSITIVE (A) NEGATIVE Final   C Diff interpretation Toxin producing C. difficile detected.  Final    Comment: CRITICAL RESULT CALLED TO, READ BACK BY AND VERIFIED WITH: Aileen Pilot, RN [phone] 11/12/20 GM Performed at [facility], [address] [place], [place] [address]   MRSA Next Gen by PCR, Nasal     Status: None   Collection Time: 11/12/20  6:53 PM   Specimen: Nasal Mucosa; Nasal Swab  Result Value Ref Range Status   MRSA by PCR Next Gen NOT DETECTED NOT DETECTED Final    Comment: (NOTE) The GeneXpert MRSA Assay (FDA approved for NASAL specimens only), is one component of a comprehensive MRSA colonization surveillance program. It is not intended to diagnose MRSA infection nor to guide or monitor treatment for MRSA infections. Test performance is not FDA approved in patients less than 77 years old. Performed at [facility], [address] [place], [place] [address]          Radiology Studies: No results found.      Scheduled Meds:  atorvastatin  80 mg Oral Daily   calcium acetate  1,334 mg Oral TID WC   escitalopram  20 mg Oral Daily   feeding supplement (NEPRO CARB STEADY)  237 mL Oral BID BM   loratadine  10 mg Oral Daily   midodrine  10 mg Oral TID WC   multivitamin  1 tablet Oral QHS   pantoprazole (PROTONIX) IV  40 mg Intravenous Q12H   rOPINIRole  1.5 mg Oral QHS   vancomycin  125 mg Oral QID   Continuous Infusions:   LOS: 3 days    Time spent: 20 mins     Wyvonnia Dusky, MD [facility] Pager [phone]  If 7PM-7AM, please contact night-coverage 11/14/2020, 7:54 AM

## 2020-11-15 ENCOUNTER — Other Ambulatory Visit (HOSPITAL_COMMUNITY): Payer: Self-pay

## 2020-11-15 LAB — CBC
HCT: 26 % — ABNORMAL LOW (ref 39.0–52.0)
Hemoglobin: 8.7 g/dL — ABNORMAL LOW (ref 13.0–17.0)
MCH: 33.9 pg (ref 26.0–34.0)
MCHC: 33.5 g/dL (ref 30.0–36.0)
MCV: 101.2 fL — ABNORMAL HIGH (ref 80.0–100.0)
Platelets: 100 10*3/uL — ABNORMAL LOW (ref 150–400)
RBC: 2.57 MIL/uL — ABNORMAL LOW (ref 4.22–5.81)
RDW: 19.5 % — ABNORMAL HIGH (ref 11.5–15.5)
WBC: 5.7 10*3/uL (ref 4.0–10.5)
nRBC: 0 % (ref 0.0–0.2)

## 2020-11-15 LAB — BASIC METABOLIC PANEL
Anion gap: 9 (ref 5–15)
BUN: 38 mg/dL — ABNORMAL HIGH (ref 8–23)
CO2: 29 mmol/L (ref 22–32)
Calcium: 8.7 mg/dL — ABNORMAL LOW (ref 8.9–10.3)
Chloride: 95 mmol/L — ABNORMAL LOW (ref 98–111)
Creatinine, Ser: 4.98 mg/dL — ABNORMAL HIGH (ref 0.61–1.24)
GFR, Estimated: 11 mL/min — ABNORMAL LOW (ref >60)
Glucose, Bld: 96 mg/dL (ref 70–99)
Potassium: 3.8 mmol/L (ref 3.5–5.1)
Sodium: 133 mmol/L — ABNORMAL LOW (ref 135–145)

## 2020-11-15 LAB — GLUCOSE, CAPILLARY: Glucose-Capillary: 88 mg/dL (ref 70–99)

## 2020-11-15 MED ORDER — VANCOMYCIN HCL 125 MG PO CAPS: 125.0000 mg | ORAL_CAPSULE | Freq: Four times a day (QID) | ORAL | 0 refills | Status: AC

## 2020-11-15 NOTE — Discharge Summary (Signed)
Physician Discharge Summary  Brent Glover [id number] DOB: [birth year] DOA: 11/11/2020  PCP: Christen Bame, DO  Admit date: 11/11/2020 Discharge date: 11/15/2020  Admitted From:  home  Disposition:  home w/ home health   Recommendations for Outpatient Follow-up:  Follow up with PCP in 1-2 weeks F/u w/ nephro in 1-2 weeks   Home Health: yes Equipment/Devices:  Discharge Condition: stable  CODE STATUS: full  Diet recommendation: Heart Healthy  Brief/Interim Summary: HPI was taken from Dr. Blaine Hamper: Brent Glover is a 85 y.o. male with medical history significant of ESRD-HD (TTS), hypertension, hyperlipidemia, diet-controlled diabetes, COPD, GERD, depression, sCHF with EF 35-40%, CAD, CABG, anemia, GI bleeding, thrombocytopenia, who presents with diarrhea with melena.   Patient has been having chronic diarrhea for several weeks which just started to become darker and and more black. He has 5 times of watery bowel movements each day.  Denies nausea vomiting or abdominal pain. He recently had dental procedure and received prophylactic antibiotics at that time. No fevers, but has chills. Patient has shortness of breath on exertion, no chest pain or cough.  Patient has generalized weakness.   Per report, patient had a dialysis today. After dialysis, patient had hypotension with SBP 70s and bradycardia with heart rate 30-50s.  His blood pressure is 90/30 in the ED.    ED Course: pt was found to have hemoglobin 7.9 --> 7.1 (9.7 on 07/10/2020), troponin level 27, 26, WBC 6.2, negative COVID PCR, potassium 3.5, bicarbonate 29, creatinine 2.35, BUN 31.  Temperature normal, blood pressure 90/39, heart rate 53, RR 18, oxygen saturation 100% on room air.  Chest x-ray showed vascular congestion and cardiomegaly.  Patient is admitted to [facility] as inpatient.  Dr. Haig Prophet of GI and Dr. Juleen China of renal are consulted.   Hospital course from Dr. Jimmye Norman 10/7-10/10/22: Pt presented w/  diarrhea w/ melena. Pt was found to have c. diff and was started on po vanco. Pt's melena resolved while inpatient. Pt was evaluated by GI but EGD was canceled as c. diff was positive.  Pt did receive 1 unit of pRBCs while inpatient but pt's H&H have remain stable. For more information, please see previous progress/consult notes.   Discharge Diagnoses:  Principal Problem:   GI bleeding Active Problems:   Hypertension   Hypercholesterolemia   Anemia due to blood loss   ESRD (end stage renal disease) ([name])   Chronic systolic CHF (congestive heart failure) ([name])   Anemia in ESRD (end-stage renal disease) ([name])   CAD (coronary artery disease)   Elevated troponin   Hypotension   Bradycardia   Thrombocytopenia ([name])   Depression    GI bleeding: continue on PPI. S/p 1 unit of pRBCs transfused. H&H are stable. No NSAIDs. No EGD as c. diff came back positive as per GI. GI following peripherally as per GI.   Acute blood loss anemia: w/ likely hx of macrocytic anemia vs ACD. Folate is not low and B12 is not low. S/p 1 unit of pRBCs transfused. Will continue to monitor H&H    C. diff positive: continue on po vanco x 10 days total as per GI    Hypotension: resolved    Hx of HTN: continue to hold anti-HTN meds as BP is WNL currently    HLD:  continue on statin    ESRD: on HD TTS. Nephro managing    Chronic systolic CHF: appears compensated. Echo on 09/30/2017 showed EF of 35-40% with grade 1 diastolic dysfunction. Volume management  w/ HD   Hx of CAD: w/ minimally elevated troponins. No chest pain, likely due to nonspecific elevation or decreased clearance in the setting of ESRD   Bradycardia: restart home dose of metoprolol    Thrombocytopenia: chronic and labile    Depression: severity unknown. Continue on home dose of lexapro   Discharge Instructions  Discharge Instructions     Diet - low sodium heart healthy   Complete by: As directed    Discharge instructions   Complete by: As  directed    F/u w/ PCP in 1-2 weeks. F/u w/ nephro in 1-2 weeks   Increase activity slowly   Complete by: As directed       Allergies as of 11/15/2020       Reactions   Montelukast Shortness Of Breath   Trazodone Other (See Comments)   "it hypes me up instead of putting me to sleep"   Hydrocodone Nausea And Vomiting        Medication List     TAKE these medications    acetaminophen 500 MG tablet Commonly known as: TYLENOL Take 1,000 mg by mouth at bedtime.   amLODipine 10 MG tablet Commonly known as: NORVASC Take 0.5 tablets (5 mg total) by mouth daily.   aspirin 81 MG tablet Take 81 mg by mouth daily.   atorvastatin 80 MG tablet Commonly known as: LIPITOR Take 80 mg by mouth daily.   b complex-vitamin c-folic acid 0.8 MG Tabs tablet Take 1 tablet by mouth daily.   calcium acetate 667 MG capsule Commonly known as: PHOSLO Take 1,334 mg by mouth 3 (three) times daily with meals.   cetirizine 10 MG tablet Commonly known as: ZYRTEC Take 10 mg by mouth at bedtime.   escitalopram 20 MG tablet Commonly known as: LEXAPRO Take 20 mg by mouth daily.   fluticasone 50 MCG/ACT nasal spray Commonly known as: FLONASE Place 2 sprays into both nostrils daily as needed for allergies.   furosemide 20 MG tablet Commonly known as: LASIX Take 1 tablet (20 mg total) by mouth daily. What changed: how much to take   lisinopril 5 MG tablet Commonly known as: ZESTRIL Take 5 mg by mouth daily.   Magnesium 250 MG Tabs Take 250 mg by mouth daily.   melatonin 5 MG Tabs Take 10 mg by mouth at bedtime as needed (sleep).   metoprolol succinate 25 MG 24 hr tablet Commonly known as: TOPROL-XL Take 25 mg by mouth daily.   midodrine 5 MG tablet Commonly known as: PROAMATINE Take 10 mg by mouth See admin instructions. Take 2 tablets ('10mg'$ ) by mouth halfway through dialysis or as directed   rOPINIRole 0.5 MG tablet Commonly known as: REQUIP Take 1.5 mg by mouth at  bedtime.   tamsulosin 0.4 MG Caps capsule Commonly known as: FLOMAX Take 0.8 mg by mouth daily.   vancomycin 125 MG capsule Commonly known as: VANCOCIN Take 1 capsule (125 mg total) by mouth 4 (four) times daily for 7 days.   Vitamin D (Ergocalciferol) 1.25 MG (50000 UNIT) Caps capsule Commonly known as: DRISDOL Take 1 capsule (50,000 Units total) by mouth every Friday.        Allergies  Allergen Reactions   Montelukast Shortness Of Breath   Trazodone Other (See Comments)    "it hypes me up instead of putting me to sleep"   Hydrocodone Nausea And Vomiting    Consultations: Nephro  GI    Procedures/Studies: DG Chest Portable 1 View  Result Date:  11/11/2020 CLINICAL DATA:  Shortness of breath and weakness. EXAM: PORTABLE CHEST 1 VIEW COMPARISON:  07/10/2020 FINDINGS: Previous median sternotomy and CABG. Chronic cardiomegaly and aortic atherosclerosis. Pulmonary venous hypertension with mild interstitial edema. No effusions. No focal pulmonary finding. IMPRESSION: Congestive heart failure. Cardiomegaly. Pulmonary venous hypertension. Mild interstitial edema. Electronically Signed   By: Nelson Chimes M.D.   On: 11/11/2020 14:33   (Echo, Carotid, EGD, Colonoscopy, ERCP)    Subjective: pt c/o fatigue    Discharge Exam: Vitals:   11/15/20 0501 11/15/20 0746  BP: (!) 128/54 130/60  Pulse: 66 68  Resp: 20 16  Temp: 98 F (36.7 C) 98.3 F (36.8 C)  SpO2: 94% 93%   Vitals:   11/14/20 1541 11/14/20 2001 11/15/20 0501 11/15/20 0746  BP: (!) 117/49 139/67 (!) 128/54 130/60  Pulse: 69 65 66 68  Resp: '18 20 20 16  '$ Temp: 98 F (36.7 C) 97.7 F (36.5 C) 98 F (36.7 C) 98.3 F (36.8 C)  TempSrc: Oral Oral Oral Oral  SpO2: 98% 95% 94% 93%  Weight:      Height:        General: Pt is alert, awake, not in acute distress Cardiovascular: S1/S2 +, no rubs, no gallops Respiratory: CTA bilaterally, no wheezing, no rhonchi Abdominal: Soft, NT, ND, hyperactive bowel sounds   Extremities: no cyanosis    The results of significant diagnostics from this hospitalization (including imaging, microbiology, ancillary and laboratory) are listed below for reference.     Microbiology: Recent Results (from the past 240 hour(s))  Resp Panel by RT-PCR (Flu A&B, Covid) Nasopharyngeal Swab     Status: None   Collection Time: 11/11/20  1:45 PM   Specimen: Nasopharyngeal Swab; Nasopharyngeal(NP) swabs in vial transport medium  Result Value Ref Range Status   SARS Coronavirus 2 by RT PCR NEGATIVE NEGATIVE Final    Comment: (NOTE) SARS-CoV-2 target nucleic acids are NOT DETECTED.  The SARS-CoV-2 RNA is generally detectable in upper respiratory specimens during the acute phase of infection. The lowest concentration of SARS-CoV-2 viral copies this assay can detect is 138 copies/mL. A negative result does not preclude SARS-Cov-2 infection and should not be used as the sole basis for treatment or other patient management decisions. A negative result may occur with  improper specimen collection/handling, submission of specimen other than nasopharyngeal swab, presence of viral mutation(s) within the areas targeted by this assay, and inadequate number of viral copies(<138 copies/mL). A negative result must be combined with clinical observations, patient history, and epidemiological information. The expected result is Negative.  Fact Sheet for Patients:  EntrepreneurPulse.com.au  Fact Sheet for Healthcare Providers:  IncredibleEmployment.be  This test is no t yet approved or cleared by the [place] FDA and  has been authorized for detection and/or diagnosis of SARS-CoV-2 by FDA under an Emergency Use Authorization (EUA). This EUA will remain  in effect (meaning this test can be used) for the duration of the COVID-19 declaration under Section 564(b)(1) of the Act, 21 U.S.C.section 360bbb-3(b)(1), unless the authorization is  terminated  or revoked sooner.       Influenza A by PCR NEGATIVE NEGATIVE Final   Influenza B by PCR NEGATIVE NEGATIVE Final    Comment: (NOTE) The Xpert Xpress SARS-CoV-2/FLU/RSV plus assay is intended as an aid in the diagnosis of influenza from Nasopharyngeal swab specimens and should not be used as a sole basis for treatment. Nasal washings and aspirates are unacceptable for Xpert Xpress SARS-CoV-2/FLU/RSV testing.  Fact Sheet for  Patients: EntrepreneurPulse.com.au  Fact Sheet for Healthcare Providers: IncredibleEmployment.be  This test is not yet approved or cleared by the [place] FDA and has been authorized for detection and/or diagnosis of SARS-CoV-2 by FDA under an Emergency Use Authorization (EUA). This EUA will remain in effect (meaning this test can be used) for the duration of the COVID-19 declaration under Section 564(b)(1) of the Act, 21 U.S.C. section 360bbb-3(b)(1), unless the authorization is terminated or revoked.  Performed at [facility], [place], [place] [address]   C Difficile Quick Screen w PCR reflex     Status: Abnormal   Collection Time: 11/12/20 10:57 AM   Specimen: STOOL  Result Value Ref Range Status   C Diff antigen POSITIVE (A) NEGATIVE Final   C Diff toxin POSITIVE (A) NEGATIVE Final   C Diff interpretation Toxin producing C. difficile detected.  Final    Comment: CRITICAL RESULT CALLED TO, READ BACK BY AND VERIFIED WITH: Aileen Pilot, RN [phone] 11/12/20 GM Performed at [facility], [address] [place], [place] [address]   MRSA Next Gen by PCR, Nasal     Status: None   Collection Time: 11/12/20  6:53 PM   Specimen: Nasal Mucosa; Nasal Swab  Result Value Ref Range Status   MRSA by PCR Next Gen NOT DETECTED NOT DETECTED Final    Comment: (NOTE) The GeneXpert MRSA Assay (FDA approved for NASAL specimens only), is one component of a comprehensive MRSA  colonization surveillance program. It is not intended to diagnose MRSA infection nor to guide or monitor treatment for MRSA infections. Test performance is not FDA approved in patients less than 83 years old. Performed at [facility], [address] [place], [place] [address]      Labs: BNP (last 3 results) Recent Labs    07/10/20 1242  BNP [id number]*   Basic Metabolic Panel: Recent Labs  Lab 11/11/20 1351 11/12/20 0640 11/13/20 0939 11/14/20 0422 11/15/20 0437  NA 135 136 133* 135 133*  K 3.5 3.9 4.0 3.6 3.8  CL 93* 95* 94* 97* 95*  CO2 '29 30 26 27 29  '$ GLUCOSE 86 80 86 95 96  BUN 31* 40* 57* 27* 38*  CREATININE 2.35* 3.49* 5.22* 3.56* 4.98*  CALCIUM 8.2* 8.3* 8.9 8.2* 8.7*   Liver Function Tests: Recent Labs  Lab 11/11/20 1351  AST 46*  ALT 22  ALKPHOS 161*  BILITOT 0.9  PROT 6.1*  ALBUMIN 2.9*   No results for input(s): LIPASE, AMYLASE in the last 168 hours. No results for input(s): AMMONIA in the last 168 hours. CBC: Recent Labs  Lab 11/12/20 1959 11/13/20 0358 11/13/20 0939 11/14/20 0422 11/15/20 0437  WBC 6.4 6.2 5.6 6.3 5.7  HGB 8.3* 8.2* 8.3* 8.8* 8.7*  HCT 25.4* 25.7* 25.3* 27.3* 26.0*  MCV 101.2* 100.0 98.8 103.4* 101.2*  PLT 96* 101* 100* 92* 100*   Cardiac Enzymes: No results for input(s): CKTOTAL, CKMB, CKMBINDEX, TROPONINI in the last 168 hours. BNP: Invalid input(s): POCBNP CBG: Recent Labs  Lab 11/12/20 0840 11/13/20 0734 11/14/20 0801 11/15/20 0743  GLUCAP 76 83 101* 88   D-Dimer No results for input(s): DDIMER in the last 72 hours. Hgb A1c No results for input(s): HGBA1C in the last 72 hours. Lipid Profile No results for input(s): CHOL, HDL, LDLCALC, TRIG, CHOLHDL, LDLDIRECT in the last 72 hours. Thyroid function studies No results for input(s): TSH, T4TOTAL, T3FREE, THYROIDAB in the last 72 hours.  Invalid input(s): FREET3 Anemia work  up Recent Labs    11/14/20 0422  VITAMINB12 1,000*  FOLATE 51.0    Urinalysis    Component Value Date/Time   COLORURINE STRAW (A) 10/01/2017 0710   APPEARANCEUR CLEAR (A) 10/01/2017 0710   LABSPEC 1.006 10/01/2017 0710   PHURINE 8.0 10/01/2017 0710   GLUCOSEU NEGATIVE 10/01/2017 0710   HGBUR NEGATIVE 10/01/2017 0710   BILIRUBINUR NEGATIVE 10/01/2017 0710   KETONESUR NEGATIVE 10/01/2017 0710   PROTEINUR 100 (A) 10/01/2017 0710   NITRITE NEGATIVE 10/01/2017 0710   LEUKOCYTESUR NEGATIVE 10/01/2017 0710   Sepsis Labs Invalid input(s): PROCALCITONIN,  WBC,  LACTICIDVEN Microbiology Recent Results (from the past 240 hour(s))  Resp Panel by RT-PCR (Flu A&B, Covid) Nasopharyngeal Swab     Status: None   Collection Time: 11/11/20  1:45 PM   Specimen: Nasopharyngeal Swab; Nasopharyngeal(NP) swabs in vial transport medium  Result Value Ref Range Status   SARS Coronavirus 2 by RT PCR NEGATIVE NEGATIVE Final    Comment: (NOTE) SARS-CoV-2 target nucleic acids are NOT DETECTED.  The SARS-CoV-2 RNA is generally detectable in upper respiratory specimens during the acute phase of infection. The lowest concentration of SARS-CoV-2 viral copies this assay can detect is 138 copies/mL. A negative result does not preclude SARS-Cov-2 infection and should not be used as the sole basis for treatment or other patient management decisions. A negative result may occur with  improper specimen collection/handling, submission of specimen other than nasopharyngeal swab, presence of viral mutation(s) within the areas targeted by this assay, and inadequate number of viral copies(<138 copies/mL). A negative result must be combined with clinical observations, patient history, and epidemiological information. The expected result is Negative.  Fact Sheet for Patients:  EntrepreneurPulse.com.au  Fact Sheet for Healthcare Providers:  IncredibleEmployment.be  This test is no t yet approved or cleared by the [place] FDA and  has been  authorized for detection and/or diagnosis of SARS-CoV-2 by FDA under an Emergency Use Authorization (EUA). This EUA will remain  in effect (meaning this test can be used) for the duration of the COVID-19 declaration under Section 564(b)(1) of the Act, 21 U.S.C.section 360bbb-3(b)(1), unless the authorization is terminated  or revoked sooner.       Influenza A by PCR NEGATIVE NEGATIVE Final   Influenza B by PCR NEGATIVE NEGATIVE Final    Comment: (NOTE) The Xpert Xpress SARS-CoV-2/FLU/RSV plus assay is intended as an aid in the diagnosis of influenza from Nasopharyngeal swab specimens and should not be used as a sole basis for treatment. Nasal washings and aspirates are unacceptable for Xpert Xpress SARS-CoV-2/FLU/RSV testing.  Fact Sheet for Patients: EntrepreneurPulse.com.au  Fact Sheet for Healthcare Providers: IncredibleEmployment.be  This test is not yet approved or cleared by the [place] FDA and has been authorized for detection and/or diagnosis of SARS-CoV-2 by FDA under an Emergency Use Authorization (EUA). This EUA will remain in effect (meaning this test can be used) for the duration of the COVID-19 declaration under Section 564(b)(1) of the Act, 21 U.S.C. section 360bbb-3(b)(1), unless the authorization is terminated or revoked.  Performed at [facility], [place], [place] [address]   C Difficile Quick Screen w PCR reflex     Status: Abnormal   Collection Time: 11/12/20 10:57 AM   Specimen: STOOL  Result Value Ref Range Status   C Diff antigen POSITIVE (A) NEGATIVE Final   C Diff toxin POSITIVE (A) NEGATIVE Final   C Diff interpretation Toxin producing C. difficile detected.  Final  Comment: CRITICAL RESULT CALLED TO, READ BACK BY AND VERIFIED WITH: Aileen Pilot, RN [phone] 11/12/20 GM Performed at [facility], [address] [place], [place] [address]   MRSA Next Gen by PCR, Nasal      Status: None   Collection Time: 11/12/20  6:53 PM   Specimen: Nasal Mucosa; Nasal Swab  Result Value Ref Range Status   MRSA by PCR Next Gen NOT DETECTED NOT DETECTED Final    Comment: (NOTE) The GeneXpert MRSA Assay (FDA approved for NASAL specimens only), is one component of a comprehensive MRSA colonization surveillance program. It is not intended to diagnose MRSA infection nor to guide or monitor treatment for MRSA infections. Test performance is not FDA approved in patients less than 41 years old. Performed at [facility], [address], [place], [place] [address]      Time coordinating discharge: Over 30 minutes  SIGNED:   Wyvonnia Dusky, MD  [facility] 11/15/2020, 12:06 PM Pager   If 7PM-7AM, please contact night-coverage

## 2020-11-15 NOTE — Discharge Summary (Deleted)
.  trh

## 2020-11-15 NOTE — TOC Transition Note (Signed)
Transition of Care [facility]) - CM/SW Discharge Note   Patient Details  Name: Brent Glover MRN: [id number] Date of Birth: [birth year]  [facility]) CM/SW Contact:  Beverly Sessions, RN Phone Number: 11/15/2020, 1:15 PM   Clinical Narrative:     Patient to discharge today [name] with [facility] notified of discharge     Barriers to Discharge: Continued Medical Work up   Patient Goals and CMS Choice Patient states their goals for this hospitalization and ongoing recovery are:: home with home health CMS Medicare.gov Compare Post Acute Care list provided to:: Patient Choice offered to / list presented to : Patient  Discharge Placement                       Discharge Plan and Services                          HH Arranged: PT, RN [facility] Agency: [place] Date [place]: 11/13/20   Representative spoke with at [place]: [place] ([place]) Interventions     Readmission Risk Interventions Readmission Risk Prevention Plan 11/13/2020  Transportation Screening Complete  PCP or Specialist Appt within 5-7 Days Complete  Home Care Screening Complete  Medication Review (RN CM) Complete  Some recent data might be hidden

## 2020-11-15 NOTE — Progress Notes (Signed)
[facility]  ROUNDING NOTE   Subjective:   [name] was admitted to [facility] on 11/11/2020 for GI bleeding [K92.2] Hypotension, unspecified hypotension type [I95.9] Gastrointestinal hemorrhage, unspecified gastrointestinal hemorrhage type [K92.2]  Patient seen resting in bed Alert and oriented Improved appetite Denies shortness of breath States diarrhea has slowed   Objective:  Vital signs in last 24 hours:  Temp:  [97.7 F (36.5 C)-98.3 F (36.8 C)] 98.3 F (36.8 C) (10/10 0746) Pulse Rate:  [65-69] 68 (10/10 0746) Resp:  [16-20] 16 (10/10 0746) BP: (117-139)/(49-67) 130/60 (10/10 0746) SpO2:  [93 %-98 %] 93 % (10/10 0746)  Weight change:  Filed Weights   11/11/20 1358  Weight: 74.4 kg    Intake/Output: I/O last 3 completed shifts: In: 110 [P.O.:110] Out: -    Intake/Output this shift:  No intake/output data recorded.  Physical Exam: General: NAD, laying in bed  Head: Normocephalic, atraumatic. Moist oral mucosal membranes  Eyes: Anicteric  Lungs:  Clear to auscultation, normal effort  Heart: Regular rate and rhythm  Abdomen:  Soft, nontender  Extremities:  no peripheral edema.  Neurologic: Nonfocal, moving all four extremities  Skin: +ecchymosis   Access: Left AVF    Basic Metabolic Panel: Recent Labs  Lab 11/11/20 1351 11/12/20 0640 11/13/20 0939 11/14/20 0422 11/15/20 0437  NA 135 136 133* 135 133*  K 3.5 3.9 4.0 3.6 3.8  CL 93* 95* 94* 97* 95*  CO2 '29 30 26 27 29  '$ GLUCOSE 86 80 86 95 96  BUN 31* 40* 57* 27* 38*  CREATININE 2.35* 3.49* 5.22* 3.56* 4.98*  CALCIUM 8.2* 8.3* 8.9 8.2* 8.7*     Liver Function Tests: Recent Labs  Lab 11/11/20 1351  AST 46*  ALT 22  ALKPHOS 161*  BILITOT 0.9  PROT 6.1*  ALBUMIN 2.9*    No results for input(s): LIPASE, AMYLASE in the last 168 hours. No results for input(s): AMMONIA in the last 168 hours.  CBC: Recent Labs  Lab 11/12/20 1959 11/13/20 0358 11/13/20 0939  11/14/20 0422 11/15/20 0437  WBC 6.4 6.2 5.6 6.3 5.7  HGB 8.3* 8.2* 8.3* 8.8* 8.7*  HCT 25.4* 25.7* 25.3* 27.3* 26.0*  MCV 101.2* 100.0 98.8 103.4* 101.2*  PLT 96* 101* 100* 92* 100*     Cardiac Enzymes: No results for input(s): CKTOTAL, CKMB, CKMBINDEX, TROPONINI in the last 168 hours.  BNP: Invalid input(s): POCBNP  CBG: Recent Labs  Lab 11/12/20 0840 11/13/20 0734 11/14/20 0801 11/15/20 0743  GLUCAP 76 83 101* 88     Microbiology: Results for orders placed or performed during the hospital encounter of 11/11/20  Resp Panel by RT-PCR (Flu A&B, Covid) Nasopharyngeal Swab     Status: None   Collection Time: 11/11/20  1:45 PM   Specimen: Nasopharyngeal Swab; Nasopharyngeal(NP) swabs in vial transport medium  Result Value Ref Range Status   SARS Coronavirus 2 by RT PCR NEGATIVE NEGATIVE Final    Comment: (NOTE) SARS-CoV-2 target nucleic acids are NOT DETECTED.  The SARS-CoV-2 RNA is generally detectable in upper respiratory specimens during the acute phase of infection. The lowest concentration of SARS-CoV-2 viral copies this assay can detect is 138 copies/mL. A negative result does not preclude SARS-Cov-2 infection and should not be used as the sole basis for treatment or other patient management decisions. A negative result may occur with  improper specimen collection/handling, submission of specimen other than nasopharyngeal swab, presence of viral mutation(s) within the areas targeted by this assay, and inadequate  number of viral copies(<138 copies/mL). A negative result must be combined with clinical observations, patient history, and epidemiological information. The expected result is Negative.  Fact Sheet for Patients:  EntrepreneurPulse.com.au  Fact Sheet for Healthcare Providers:  IncredibleEmployment.be  This test is no t yet approved or cleared by the [place] FDA and  has been authorized for detection and/or  diagnosis of SARS-CoV-2 by FDA under an Emergency Use Authorization (EUA). This EUA will remain  in effect (meaning this test can be used) for the duration of the COVID-19 declaration under Section 564(b)(1) of the Act, 21 U.S.C.section 360bbb-3(b)(1), unless the authorization is terminated  or revoked sooner.       Influenza A by PCR NEGATIVE NEGATIVE Final   Influenza B by PCR NEGATIVE NEGATIVE Final    Comment: (NOTE) The Xpert Xpress SARS-CoV-2/FLU/RSV plus assay is intended as an aid in the diagnosis of influenza from Nasopharyngeal swab specimens and should not be used as a sole basis for treatment. Nasal washings and aspirates are unacceptable for Xpert Xpress SARS-CoV-2/FLU/RSV testing.  Fact Sheet for Patients: EntrepreneurPulse.com.au  Fact Sheet for Healthcare Providers: IncredibleEmployment.be  This test is not yet approved or cleared by the [place] FDA and has been authorized for detection and/or diagnosis of SARS-CoV-2 by FDA under an Emergency Use Authorization (EUA). This EUA will remain in effect (meaning this test can be used) for the duration of the COVID-19 declaration under Section 564(b)(1) of the Act, 21 U.S.C. section 360bbb-3(b)(1), unless the authorization is terminated or revoked.  Performed at [facility], [place], [place] [address]   C Difficile Quick Screen w PCR reflex     Status: Abnormal   Collection Time: 11/12/20 10:57 AM   Specimen: STOOL  Result Value Ref Range Status   C Diff antigen POSITIVE (A) NEGATIVE Final   C Diff toxin POSITIVE (A) NEGATIVE Final   C Diff interpretation Toxin producing C. difficile detected.  Final    Comment: CRITICAL RESULT CALLED TO, READ BACK BY AND VERIFIED WITH: Aileen Pilot, RN [phone] 11/12/20 GM Performed at [facility], [address] [place], [place] [address]   MRSA Next Gen by PCR, Nasal     Status: None   Collection  Time: 11/12/20  6:53 PM   Specimen: Nasal Mucosa; Nasal Swab  Result Value Ref Range Status   MRSA by PCR Next Gen NOT DETECTED NOT DETECTED Final    Comment: (NOTE) The GeneXpert MRSA Assay (FDA approved for NASAL specimens only), is one component of a comprehensive MRSA colonization surveillance program. It is not intended to diagnose MRSA infection nor to guide or monitor treatment for MRSA infections. Test performance is not FDA approved in patients less than 25 years old. Performed at [facility], [address] [place], [place] [address]     Coagulation Studies: No results for input(s): LABPROT, INR in the last 72 hours.   Urinalysis: No results for input(s): COLORURINE, LABSPEC, PHURINE, GLUCOSEU, HGBUR, BILIRUBINUR, KETONESUR, PROTEINUR, UROBILINOGEN, NITRITE, LEUKOCYTESUR in the last 72 hours.  Invalid input(s): APPERANCEUR    Imaging: No results found.   Medications:     atorvastatin  80 mg Oral Daily   calcium acetate  1,334 mg Oral TID WC   escitalopram  20 mg Oral Daily   feeding supplement (NEPRO CARB STEADY)  237 mL Oral BID BM   loratadine  10 mg Oral Daily   midodrine  10 mg Oral TID WC   multivitamin  1 tablet Oral QHS   pantoprazole (PROTONIX) IV  40 mg Intravenous Q12H   rOPINIRole  1.5 mg Oral QHS   vancomycin  125 mg Oral QID   acetaminophen, fluticasone, melatonin, ondansetron (ZOFRAN) IV  Assessment/ Plan:  [name] is a 85 y.o. white male with end stage renal disease on hemodialysis, hypotension, coronary artery disease status post CABG, hyperlipidemia, BPH, congestive heart failure, COPD, AAA status post stent, peptic ulcer disease, left warthin tumor who is admitted to [facility] on 11/11/2020 for GI bleeding [K92.2] Hypotension, unspecified hypotension type [I95.9] Gastrointestinal hemorrhage, unspecified gastrointestinal hemorrhage type [K92.2]  [facility] TTS [address] Left AVF 72kg  End Stage Renal  Disease:  - Continue TTS schedule. - If discharged, can resume outpatient treatments as scheduled   Hypotension: 130/60.   - Continue midodrine '10mg'$  TID.   Anemia with chronic kidney disease: and GI bleed. Hemoglobin 8.8 macrocytic. Baseline hemoglobin of 10.9 on 10/14/20. No indication for transfusion. Currently 8.7. - Will continue Mircera as outpatient.  - appreciate GI input.   Secondary Hyperparathyroidism:  - Calcium acetate with meals.   5.   Colitis: C-diff positive.  - PO Vancomycin     LOS: 4   10/10/202212:37 PM

## 2020-11-15 NOTE — Care Management Important Message (Signed)
Important Message  Patient Details  Name: Brent Glover MRN: [id number] Date of Birth: 04-01-1935   Medicare Important Message Given:  Other (see comment)  Attempted to review Medicare IM with patient via room phone due to isolation status.  Unable to reach upon attempt.     Dannette Barbara 11/15/2020, 1:33 PM

## 2020-11-15 NOTE — Progress Notes (Signed)
PIV removed. AVS covered with patient and wife at bedside. Patient provided with printed prescription for oral vanc as well as coupon. Patient verbalizes understanding of medication regimen.

## 2020-11-16 LAB — HEPATITIS B SURFACE ANTIBODY, QUANTITATIVE: Hep B S AB Quant (Post): 148.4 m[IU]/mL (ref >9.9)

## 2020-12-07 ENCOUNTER — Other Ambulatory Visit: Payer: Self-pay

## 2020-12-07 ENCOUNTER — Emergency Department: Payer: Medicare Other

## 2020-12-07 ENCOUNTER — Emergency Department
Admission: EM | Admit: 2020-12-07 | Discharge: 2020-12-07 | Disposition: A | Discharge status: Home / Self Care | Payer: Medicare Other | Attending: Emergency Medicine | Admitting: Emergency Medicine

## 2020-12-07 ENCOUNTER — Encounter: Payer: Self-pay | Admitting: Emergency Medicine

## 2020-12-07 DIAGNOSIS — I132 Hypertensive heart and chronic kidney disease with heart failure and with stage 5 chronic kidney disease, or end stage renal disease: Secondary | ICD-10-CM | POA: Insufficient documentation

## 2020-12-07 DIAGNOSIS — W19XXXA Unspecified fall, initial encounter: Secondary | ICD-10-CM

## 2020-12-07 DIAGNOSIS — E1122 Type 2 diabetes mellitus with diabetic chronic kidney disease: Secondary | ICD-10-CM | POA: Diagnosis not present

## 2020-12-07 DIAGNOSIS — I2510 Atherosclerotic heart disease of native coronary artery without angina pectoris: Secondary | ICD-10-CM | POA: Diagnosis not present

## 2020-12-07 DIAGNOSIS — S022XXA Fracture of nasal bones, initial encounter for closed fracture: Secondary | ICD-10-CM | POA: Diagnosis not present

## 2020-12-07 DIAGNOSIS — Z7982 Long term (current) use of aspirin: Secondary | ICD-10-CM | POA: Diagnosis not present

## 2020-12-07 DIAGNOSIS — Z79899 Other long term (current) drug therapy: Secondary | ICD-10-CM | POA: Diagnosis not present

## 2020-12-07 DIAGNOSIS — N186 End stage renal disease: Secondary | ICD-10-CM | POA: Diagnosis not present

## 2020-12-07 DIAGNOSIS — W01198A Fall on same level from slipping, tripping and stumbling with subsequent striking against other object, initial encounter: Secondary | ICD-10-CM | POA: Diagnosis not present

## 2020-12-07 DIAGNOSIS — J449 Chronic obstructive pulmonary disease, unspecified: Secondary | ICD-10-CM | POA: Insufficient documentation

## 2020-12-07 DIAGNOSIS — I5042 Chronic combined systolic (congestive) and diastolic (congestive) heart failure: Secondary | ICD-10-CM | POA: Diagnosis not present

## 2020-12-07 DIAGNOSIS — Z992 Dependence on renal dialysis: Secondary | ICD-10-CM | POA: Diagnosis not present

## 2020-12-07 DIAGNOSIS — Z96659 Presence of unspecified artificial knee joint: Secondary | ICD-10-CM | POA: Insufficient documentation

## 2020-12-07 DIAGNOSIS — S0990XA Unspecified injury of head, initial encounter: Secondary | ICD-10-CM

## 2020-12-07 DIAGNOSIS — S0992XA Unspecified injury of nose, initial encounter: Secondary | ICD-10-CM | POA: Diagnosis present

## 2020-12-07 DIAGNOSIS — F17210 Nicotine dependence, cigarettes, uncomplicated: Secondary | ICD-10-CM | POA: Insufficient documentation

## 2020-12-07 LAB — TROPONIN I (HIGH SENSITIVITY)
Troponin I (High Sensitivity): 34 ng/L — ABNORMAL HIGH (ref <18)
Troponin I (High Sensitivity): 35 ng/L — ABNORMAL HIGH (ref <18)

## 2020-12-07 LAB — COMPREHENSIVE METABOLIC PANEL
ALT: 12 U/L (ref 0–44)
AST: 26 U/L (ref 15–41)
Albumin: 2.9 g/dL — ABNORMAL LOW (ref 3.5–5.0)
Alkaline Phosphatase: 132 U/L — ABNORMAL HIGH (ref 38–126)
Anion gap: 13 (ref 5–15)
BUN: 31 mg/dL — ABNORMAL HIGH (ref 8–23)
CO2: 30 mmol/L (ref 22–32)
Calcium: 8.1 mg/dL — ABNORMAL LOW (ref 8.9–10.3)
Chloride: 96 mmol/L — ABNORMAL LOW (ref 98–111)
Creatinine, Ser: 5.71 mg/dL — ABNORMAL HIGH (ref 0.61–1.24)
GFR, Estimated: 9 mL/min — ABNORMAL LOW (ref >60)
Glucose, Bld: 96 mg/dL (ref 70–99)
Potassium: 3.3 mmol/L — ABNORMAL LOW (ref 3.5–5.1)
Sodium: 139 mmol/L (ref 135–145)
Total Bilirubin: 1.1 mg/dL (ref 0.3–1.2)
Total Protein: 6.4 g/dL — ABNORMAL LOW (ref 6.5–8.1)

## 2020-12-07 LAB — CBC WITH DIFFERENTIAL/PLATELET
Abs Immature Granulocytes: 0.02 10*3/uL (ref 0.00–0.07)
Basophils Absolute: 0.1 10*3/uL (ref 0.0–0.1)
Basophils Relative: 1 %
Eosinophils Absolute: 0.1 10*3/uL (ref 0.0–0.5)
Eosinophils Relative: 3 %
HCT: 29.8 % — ABNORMAL LOW (ref 39.0–52.0)
Hemoglobin: 9.4 g/dL — ABNORMAL LOW (ref 13.0–17.0)
Immature Granulocytes: 0 %
Lymphocytes Relative: 13 %
Lymphs Abs: 0.7 10*3/uL (ref 0.7–4.0)
MCH: 32.1 pg (ref 26.0–34.0)
MCHC: 31.5 g/dL (ref 30.0–36.0)
MCV: 101.7 fL — ABNORMAL HIGH (ref 80.0–100.0)
Monocytes Absolute: 0.6 10*3/uL (ref 0.1–1.0)
Monocytes Relative: 12 %
Neutro Abs: 3.8 10*3/uL (ref 1.7–7.7)
Neutrophils Relative %: 71 %
Platelets: 67 10*3/uL — ABNORMAL LOW (ref 150–400)
RBC: 2.93 MIL/uL — ABNORMAL LOW (ref 4.22–5.81)
RDW: 19.5 % — ABNORMAL HIGH (ref 11.5–15.5)
WBC: 5.3 10*3/uL (ref 4.0–10.5)
nRBC: 0 % (ref 0.0–0.2)

## 2020-12-07 LAB — LIPASE, BLOOD: Lipase: 42 U/L (ref 11–51)

## 2020-12-07 LAB — PROTIME-INR
INR: 1.2 (ref 0.8–1.2)
Prothrombin Time: 15.3 seconds — ABNORMAL HIGH (ref 11.4–15.2)

## 2020-12-07 NOTE — ED Provider Notes (Signed)
Emergency Medicine Provider Triage Evaluation Note  Brent Glover , a 85 y.o. male  was evaluated in triage.  Pt complains of diarrhea x1 month, fall with facial injury.  Denies anticoagulant use.  Review of Systems  Positive: Facial injuries Negative: Shortness of breath  Physical Exam  BP (!) 113/51 (BP Location: Left Arm)   Pulse (!) 127   Temp 97.9 F (36.6 C) (Oral)   Resp 18   Ht 5\' 7"  (1.702 m)   Wt 72.1 kg   SpO2 92%   BMI 24.90 kg/m  Gen:   Awake, no distress   Resp:  Normal effort  MSK:   Moves extremities without difficulty  Other:  Facial bruising  Medical Decision Making  Medically screening exam initiated at 5:04 AM.  Appropriate orders placed.  Brent Glover was informed that the remainder of the evaluation will be completed by another provider, this initial triage assessment does not replace that evaluation, and the importance of remaining in the ED until their evaluation is complete.  85 year old male brought for diarrhea, fall with facial injury.  Will obtain lab work, CT head/cervical spine/maxillofacial.  Patient will be seen by provider once he is provided a treatment room.   Paulette Blanch, MD 12/07/20 [phone]

## 2020-12-07 NOTE — ED Triage Notes (Signed)
Pt to triage via w/c with no distress noted, brought in by EMS; pt reports persistent diarrhea since seen here previously; got up to BR, "tripped" and fell; large bruise noted to rt eye/nose; pt denies pain

## 2020-12-07 NOTE — Discharge Instructions (Signed)
You have been seen for a fall.  Your work-up in the emergency department is reassuring.  You have a small nasal bone fracture that should heal on its own.  You have bruising/hematoma to your face.  You may use ice for discomfort for 20 minutes every 2-3 hours which should help reduce the swelling.  You may also use Tylenol as needed for pain as written on the box.  Return to the emergency department for any symptom personally concerning to yourself.

## 2020-12-07 NOTE — ED Provider Notes (Signed)
[facility] Emergency Department Provider Note  Time seen: 10:54 AM  I have reviewed the triage vital signs and the nursing notes.   HISTORY  Chief Complaint Fall   HPI Brent Glover is a 85 y.o. male with a past medical history of COPD, CHF, diabetes, ESRD on HD, hypertension, hyperlipidemia, presents to the emergency department after a fall.  According to the patient patient states he tripped while going to the bathroom hitting his head.  Patient denies any loss of consciousness nausea or vomiting.  Patient states he is feeling well, denies any pain in any arm or leg.  Patient states mild pain to his face.   Past Medical History:  Diagnosis Date   BPH (benign prostatic hyperplasia)    Chronic diastolic heart failure ([name])    COPD (chronic obstructive pulmonary disease) ([name])    Diabetes mellitus without complication ([name])    ESRD (end stage renal disease) ([place])    stage III   Hyperlipidemia    Hypertension     Patient Active Problem List   Diagnosis Date Noted   GI bleeding 11/11/2020   Type II diabetes mellitus with renal manifestations ([place]) 11/11/2020   Depression 11/11/2020   ESRD (end stage renal disease) ([name])    Chronic systolic CHF (congestive heart failure) ([name])    Anemia in ESRD (end-stage renal disease) ([name])    CAD (coronary artery disease)    Elevated troponin    Hypotension    Bradycardia    Thrombocytopenia ([name])    Anemia due to blood loss    Acute on chronic anemia 09/30/2017   Syncope and collapse 09/29/2017   Chronic kidney disease    CAD in native artery 11/23/2014   H/O coronary artery bypass surgery 10/28/2014   Acute non-ST elevation myocardial infarction (NSTEMI) ([place]) 10/15/2014   Chronic kidney disease (CKD), stage III (moderate) ([name]) 03/17/2014   Creatinine elevation 03/11/2014   Chronic combined systolic and diastolic heart failure ([place]) 11/29/2012   Claudication ([place]) 06/25/2012   Chronic diastolic heart  failure ([place])    Hypertension    Type 2 diabetes mellitus ([place]) 09/21/2011   Aneurysm ([place]) 10/07/2010   Arthritis, degenerative 09/09/2010   Hypercholesterolemia 04/04/2009   Benign hypertension 09/04/2008    Past Surgical History:  Procedure Laterality Date   ABDOMINAL AORTIC ENDOVASCULAR STENT GRAFT     CYSTOSCOPY     KNEE ARTHROPLASTY      Prior to Admission medications   Medication Sig Start Date End Date Taking? Authorizing Provider  acetaminophen (TYLENOL) 500 MG tablet Take 1,000 mg by mouth at bedtime.    [provider]  amLODipine (NORVASC) 10 MG tablet Take 0.5 tablets (5 mg total) by mouth daily. Patient not taking: No sig reported 06/25/12   Wellington Hampshire, MD  aspirin 81 MG tablet Take 81 mg by mouth daily.    [provider]  atorvastatin (LIPITOR) 80 MG tablet Take 80 mg by mouth daily. 08/17/17   [provider]  b complex-vitamin c-folic acid (NEPHRO-VITE) 0.8 MG TABS tablet Take 1 tablet by mouth daily.    [provider]  calcium acetate (PHOSLO) 667 MG capsule Take 1,334 mg by mouth 3 (three) times daily with meals.    [provider]  cetirizine (ZYRTEC) 10 MG tablet Take 10 mg by mouth at bedtime.    [provider]  escitalopram (LEXAPRO) 20 MG tablet Take 20 mg by mouth daily.     [provider]  fluticasone (FLONASE) 50 MCG/ACT nasal spray Place 2 sprays into both nostrils daily as needed for allergies.    [provider]  furosemide (LASIX) 20 MG tablet Take 1 tablet (20 mg total) by mouth daily. Patient taking differently: Take 80 mg by mouth daily. 06/27/12   Wellington Hampshire, MD  lisinopril (ZESTRIL) 5 MG tablet Take 5 mg by mouth daily.    [provider]  Magnesium 250 MG TABS Take 250 mg by mouth daily.    [provider]  Melatonin 5 MG TABS Take 10 mg by mouth at bedtime as needed (sleep).    [provider]  metoprolol succinate (TOPROL-XL) 25 MG  24 hr tablet Take 25 mg by mouth daily.    [provider]  midodrine (PROAMATINE) 5 MG tablet Take 10 mg by mouth See admin instructions. Take 2 tablets (10mg ) by mouth halfway through dialysis or as directed    [provider]  rOPINIRole (REQUIP) 0.5 MG tablet Take 1.5 mg by mouth at bedtime.    [provider]  tamsulosin (FLOMAX) 0.4 MG CAPS Take 0.8 mg by mouth daily.    [provider]  Vitamin D, Ergocalciferol, (DRISDOL) 50000 units CAPS capsule Take 1 capsule (50,000 Units total) by mouth every Friday. Patient not taking: No sig reported 10/05/17   [name], MD    Allergies  Allergen Reactions   Montelukast Shortness Of Breath   Trazodone Other (See Comments)    "it hypes me up instead of putting me to sleep"   Hydrocodone Nausea And Vomiting    Family History  Problem Relation Age of Onset   Diabetes Mellitus II Sister    Diabetes type II Brother     Social History Social History   Tobacco Use   Smoking status: Every Day    Packs/day: 1.00    Years: 60.00    Pack years: 60.00    Types: Cigarettes    Last attempt to quit: 10/11/2014    Years since quitting: 6.1   Smokeless tobacco: Never  Substance Use Topics   Alcohol use: No   Drug use: No    Review of Systems Constitutional: Negative for LOC. Cardiovascular: Negative for chest pain. Respiratory: Negative for shortness of breath. Gastrointestinal: Negative for abdominal pain Musculoskeletal: Negative for musculoskeletal complaints Neurological: Negative for headache All other ROS negative  ____________________________________________   PHYSICAL EXAM:  VITAL SIGNS: ED Triage Vitals  Enc Vitals Group     BP 12/07/20 0501 (!) 113/51     Pulse Rate 12/07/20 0501 (!) 127     Resp 12/07/20 0501 18     Temp 12/07/20 0501 97.9 F (36.6 C)     Temp Source 12/07/20 0501 Oral     SpO2 12/07/20 0501 92 %     Weight 12/07/20 0502 159 lb (72.1 kg)     Height 12/07/20  0502 5\' 7"  (1.702 m)     Head Circumference --      Peak Flow --      Pain Score 12/07/20 0501 0     Pain Loc --      Pain Edu? --      Excl. in [place]? --    Constitutional: Alert and oriented. Well appearing and in no distress. Eyes: Hematoma above right eye with ecchymosis around the right eye.  Mild ecchymosis of the nasal bridge ENT      Head: Patient has bruising around his right eye and nasal bridge.  Hematoma  noted above the right eye      Mouth/Throat: Mucous membranes are moist. Cardiovascular: Normal rate, regular rhythm. No murmurs, rubs, or gallops. Respiratory: Normal respiratory effort without tachypnea nor retractions. Breath sounds are clear  Gastrointestinal: Soft and nontender. No distention. Musculoskeletal: Nontender with normal range of motion in all extremities. No lower extremity tenderness  Neurologic:  Normal speech and language. No gross focal neurologic deficits  Skin:  Skin is warm, dry and intact.  Psychiatric: Mood and affect are normal.   ____________________________________________    EKG  EKG viewed and interpreted by myself shows atrial fibrillation 48 bpm with a narrow QRS, normal axis, largely normal intervals with nonspecific ST changes.  ____________________________________________    RADIOLOGY  Chest x-ray shows chronic changes with no acute findings. CT scans of the head/C-spine/face show right scalp hematoma nondisplaced nasal bone fracture, otherwise chronic changes.  ____________________________________________   INITIAL IMPRESSION / ASSESSMENT AND PLAN / ED COURSE  Pertinent labs & imaging results that were available during my care of the patient were reviewed by me and considered in my medical decision making (see chart for details).   Patient presents to the emergency department after mechanical fall today after tripping try to go to the bathroom.  Patient's work-up is overall reassuring he does have a hematoma above his right eye  and bruising to the face as well as a nondisplaced nasal bone fracture.  Overall the patient appears well, lab work is reassuring.  Patient is on dialysis but has a normal potassium currently.  Patient does have a small skin tear to the right upper extremity we will dressed with Xeroform and gauze.  Given the patient's reassuring work-up I believe he is safe for discharge home with PCP follow-up.  Patient agreeable to plan of care.  Brent Glover was evaluated in Emergency Department on 12/07/2020 for the symptoms described in the history of present illness. He was evaluated in the context of the global COVID-19 pandemic, which necessitated consideration that the patient might be at risk for infection with the SARS-CoV-2 virus that causes COVID-19. Institutional protocols and algorithms that pertain to the evaluation of patients at risk for COVID-19 are in a state of rapid change based on information released by regulatory bodies including the CDC and federal and state organizations. These policies and algorithms were followed during the patient's care in the ED.  ____________________________________________   FINAL CLINICAL IMPRESSION(S) / ED DIAGNOSES  Fall Head injury Nasal bone fracture   Harvest Dark, MD 12/07/20 1058

## 2020-12-07 NOTE — ED Notes (Signed)
Pt assisted to stretcher by this RN with the help of NT Amber, pt cleaned, brief changed, given warm blankets, connected to all monitoring equipment, side table/belongings/call bell within reach, non-slip socks applied, bed in lowest position. MD Paduchowski at bedside.

## 2020-12-28 ENCOUNTER — Emergency Department: Payer: Medicare Other

## 2020-12-28 ENCOUNTER — Inpatient Hospital Stay
Admission: EM | Admit: 2020-12-28 | Discharge: 2021-02-06 | DRG: 377 | Disposition: E | Payer: Medicare Other | Attending: Internal Medicine | Admitting: Internal Medicine

## 2020-12-28 DIAGNOSIS — D638 Anemia in other chronic diseases classified elsewhere: Secondary | ICD-10-CM

## 2020-12-28 DIAGNOSIS — K5731 Diverticulosis of large intestine without perforation or abscess with bleeding: Principal | ICD-10-CM | POA: Diagnosis present

## 2020-12-28 DIAGNOSIS — R197 Diarrhea, unspecified: Secondary | ICD-10-CM

## 2020-12-28 DIAGNOSIS — Z9842 Cataract extraction status, left eye: Secondary | ICD-10-CM

## 2020-12-28 DIAGNOSIS — I5043 Acute on chronic combined systolic (congestive) and diastolic (congestive) heart failure: Secondary | ICD-10-CM | POA: Diagnosis present

## 2020-12-28 DIAGNOSIS — I959 Hypotension, unspecified: Secondary | ICD-10-CM | POA: Diagnosis present

## 2020-12-28 DIAGNOSIS — I132 Hypertensive heart and chronic kidney disease with heart failure and with stage 5 chronic kidney disease, or end stage renal disease: Secondary | ICD-10-CM | POA: Diagnosis present

## 2020-12-28 DIAGNOSIS — Z885 Allergy status to narcotic agent status: Secondary | ICD-10-CM

## 2020-12-28 DIAGNOSIS — Z833 Family history of diabetes mellitus: Secondary | ICD-10-CM

## 2020-12-28 DIAGNOSIS — R140 Abdominal distension (gaseous): Secondary | ICD-10-CM

## 2020-12-28 DIAGNOSIS — I779 Disorder of arteries and arterioles, unspecified: Secondary | ICD-10-CM | POA: Diagnosis present

## 2020-12-28 DIAGNOSIS — Z8619 Personal history of other infectious and parasitic diseases: Secondary | ICD-10-CM | POA: Diagnosis present

## 2020-12-28 DIAGNOSIS — D649 Anemia, unspecified: Secondary | ICD-10-CM | POA: Diagnosis present

## 2020-12-28 DIAGNOSIS — Z888 Allergy status to other drugs, medicaments and biological substances status: Secondary | ICD-10-CM

## 2020-12-28 DIAGNOSIS — K922 Gastrointestinal hemorrhage, unspecified: Secondary | ICD-10-CM | POA: Diagnosis not present

## 2020-12-28 DIAGNOSIS — R54 Age-related physical debility: Secondary | ICD-10-CM | POA: Diagnosis present

## 2020-12-28 DIAGNOSIS — I714 Abdominal aortic aneurysm, without rupture, unspecified: Secondary | ICD-10-CM

## 2020-12-28 DIAGNOSIS — I5042 Chronic combined systolic (congestive) and diastolic (congestive) heart failure: Secondary | ICD-10-CM | POA: Diagnosis present

## 2020-12-28 DIAGNOSIS — Z79899 Other long term (current) drug therapy: Secondary | ICD-10-CM

## 2020-12-28 DIAGNOSIS — G2581 Restless legs syndrome: Secondary | ICD-10-CM | POA: Diagnosis present

## 2020-12-28 DIAGNOSIS — Z515 Encounter for palliative care: Secondary | ICD-10-CM

## 2020-12-28 DIAGNOSIS — Z9181 History of falling: Secondary | ICD-10-CM

## 2020-12-28 DIAGNOSIS — I7143 Infrarenal abdominal aortic aneurysm, without rupture: Secondary | ICD-10-CM | POA: Diagnosis present

## 2020-12-28 DIAGNOSIS — E1151 Type 2 diabetes mellitus with diabetic peripheral angiopathy without gangrene: Secondary | ICD-10-CM | POA: Diagnosis present

## 2020-12-28 DIAGNOSIS — Z7982 Long term (current) use of aspirin: Secondary | ICD-10-CM

## 2020-12-28 DIAGNOSIS — J449 Chronic obstructive pulmonary disease, unspecified: Secondary | ICD-10-CM | POA: Diagnosis present

## 2020-12-28 DIAGNOSIS — E873 Alkalosis: Secondary | ICD-10-CM | POA: Diagnosis not present

## 2020-12-28 DIAGNOSIS — R410 Disorientation, unspecified: Secondary | ICD-10-CM

## 2020-12-28 DIAGNOSIS — F17210 Nicotine dependence, cigarettes, uncomplicated: Secondary | ICD-10-CM | POA: Diagnosis present

## 2020-12-28 DIAGNOSIS — E11649 Type 2 diabetes mellitus with hypoglycemia without coma: Secondary | ICD-10-CM | POA: Diagnosis present

## 2020-12-28 DIAGNOSIS — Z9841 Cataract extraction status, right eye: Secondary | ICD-10-CM

## 2020-12-28 DIAGNOSIS — N400 Enlarged prostate without lower urinary tract symptoms: Secondary | ICD-10-CM | POA: Diagnosis present

## 2020-12-28 DIAGNOSIS — K551 Chronic vascular disorders of intestine: Secondary | ICD-10-CM | POA: Diagnosis present

## 2020-12-28 DIAGNOSIS — Z9115 Patient's noncompliance with renal dialysis: Secondary | ICD-10-CM

## 2020-12-28 DIAGNOSIS — R296 Repeated falls: Secondary | ICD-10-CM | POA: Diagnosis present

## 2020-12-28 DIAGNOSIS — N186 End stage renal disease: Secondary | ICD-10-CM | POA: Diagnosis present

## 2020-12-28 DIAGNOSIS — I452 Bifascicular block: Secondary | ICD-10-CM | POA: Diagnosis present

## 2020-12-28 DIAGNOSIS — R0602 Shortness of breath: Secondary | ICD-10-CM

## 2020-12-28 DIAGNOSIS — R509 Fever, unspecified: Secondary | ICD-10-CM

## 2020-12-28 DIAGNOSIS — R188 Other ascites: Secondary | ICD-10-CM | POA: Diagnosis present

## 2020-12-28 DIAGNOSIS — J9601 Acute respiratory failure with hypoxia: Secondary | ICD-10-CM | POA: Diagnosis not present

## 2020-12-28 DIAGNOSIS — I701 Atherosclerosis of renal artery: Secondary | ICD-10-CM | POA: Diagnosis present

## 2020-12-28 DIAGNOSIS — K4090 Unilateral inguinal hernia, without obstruction or gangrene, not specified as recurrent: Secondary | ICD-10-CM | POA: Diagnosis present

## 2020-12-28 DIAGNOSIS — Z992 Dependence on renal dialysis: Secondary | ICD-10-CM

## 2020-12-28 DIAGNOSIS — R531 Weakness: Secondary | ICD-10-CM

## 2020-12-28 DIAGNOSIS — Z20822 Contact with and (suspected) exposure to covid-19: Secondary | ICD-10-CM | POA: Diagnosis present

## 2020-12-28 DIAGNOSIS — E1122 Type 2 diabetes mellitus with diabetic chronic kidney disease: Secondary | ICD-10-CM | POA: Diagnosis present

## 2020-12-28 DIAGNOSIS — I083 Combined rheumatic disorders of mitral, aortic and tricuspid valves: Secondary | ICD-10-CM | POA: Diagnosis present

## 2020-12-28 DIAGNOSIS — D631 Anemia in chronic kidney disease: Secondary | ICD-10-CM | POA: Diagnosis present

## 2020-12-28 DIAGNOSIS — I2510 Atherosclerotic heart disease of native coronary artery without angina pectoris: Secondary | ICD-10-CM | POA: Diagnosis present

## 2020-12-28 DIAGNOSIS — F32A Depression, unspecified: Secondary | ICD-10-CM | POA: Diagnosis present

## 2020-12-28 DIAGNOSIS — E1129 Type 2 diabetes mellitus with other diabetic kidney complication: Secondary | ICD-10-CM | POA: Diagnosis present

## 2020-12-28 DIAGNOSIS — I700 Atherosclerosis of aorta: Secondary | ICD-10-CM | POA: Diagnosis present

## 2020-12-28 DIAGNOSIS — E785 Hyperlipidemia, unspecified: Secondary | ICD-10-CM | POA: Diagnosis present

## 2020-12-28 DIAGNOSIS — I739 Peripheral vascular disease, unspecified: Secondary | ICD-10-CM | POA: Diagnosis present

## 2020-12-28 DIAGNOSIS — Z951 Presence of aortocoronary bypass graft: Secondary | ICD-10-CM

## 2020-12-28 DIAGNOSIS — D62 Acute posthemorrhagic anemia: Secondary | ICD-10-CM | POA: Diagnosis present

## 2020-12-28 DIAGNOSIS — Z66 Do not resuscitate: Secondary | ICD-10-CM | POA: Diagnosis present

## 2020-12-28 DIAGNOSIS — D61818 Other pancytopenia: Secondary | ICD-10-CM | POA: Diagnosis present

## 2020-12-28 DIAGNOSIS — D696 Thrombocytopenia, unspecified: Secondary | ICD-10-CM | POA: Diagnosis present

## 2020-12-28 DIAGNOSIS — N2581 Secondary hyperparathyroidism of renal origin: Secondary | ICD-10-CM | POA: Diagnosis present

## 2020-12-28 DIAGNOSIS — R600 Localized edema: Secondary | ICD-10-CM

## 2020-12-28 DIAGNOSIS — G9341 Metabolic encephalopathy: Secondary | ICD-10-CM | POA: Diagnosis not present

## 2020-12-28 DIAGNOSIS — E876 Hypokalemia: Secondary | ICD-10-CM | POA: Diagnosis present

## 2020-12-28 LAB — COMPREHENSIVE METABOLIC PANEL
ALT: 13 U/L (ref 0–44)
AST: 33 U/L (ref 15–41)
Albumin: 2 g/dL — ABNORMAL LOW (ref 3.5–5.0)
Alkaline Phosphatase: 110 U/L (ref 38–126)
Anion gap: 7 (ref 5–15)
BUN: 22 mg/dL (ref 8–23)
CO2: 33 mmol/L — ABNORMAL HIGH (ref 22–32)
Calcium: 7.5 mg/dL — ABNORMAL LOW (ref 8.9–10.3)
Chloride: 95 mmol/L — ABNORMAL LOW (ref 98–111)
Creatinine, Ser: 2.89 mg/dL — ABNORMAL HIGH (ref 0.61–1.24)
GFR, Estimated: 21 mL/min — ABNORMAL LOW (ref >60)
Glucose, Bld: 68 mg/dL — ABNORMAL LOW (ref 70–99)
Potassium: 3 mmol/L — ABNORMAL LOW (ref 3.5–5.1)
Sodium: 135 mmol/L (ref 135–145)
Total Bilirubin: 1 mg/dL (ref 0.3–1.2)
Total Protein: 5.4 g/dL — ABNORMAL LOW (ref 6.5–8.1)

## 2020-12-28 LAB — CBC WITH DIFFERENTIAL/PLATELET
Abs Immature Granulocytes: 0.03 10*3/uL (ref 0.00–0.07)
Basophils Absolute: 0 10*3/uL (ref 0.0–0.1)
Basophils Relative: 1 %
Eosinophils Absolute: 0 10*3/uL (ref 0.0–0.5)
Eosinophils Relative: 1 %
HCT: 24 % — ABNORMAL LOW (ref 39.0–52.0)
Hemoglobin: 7.7 g/dL — ABNORMAL LOW (ref 13.0–17.0)
Immature Granulocytes: 1 %
Lymphocytes Relative: 17 %
Lymphs Abs: 0.7 10*3/uL (ref 0.7–4.0)
MCH: 32.4 pg (ref 26.0–34.0)
MCHC: 32.1 g/dL (ref 30.0–36.0)
MCV: 100.8 fL — ABNORMAL HIGH (ref 80.0–100.0)
Monocytes Absolute: 0.5 10*3/uL (ref 0.1–1.0)
Monocytes Relative: 12 %
Neutro Abs: 2.9 10*3/uL (ref 1.7–7.7)
Neutrophils Relative %: 68 %
Platelets: 54 10*3/uL — ABNORMAL LOW (ref 150–400)
RBC: 2.38 MIL/uL — ABNORMAL LOW (ref 4.22–5.81)
RDW: 19.2 % — ABNORMAL HIGH (ref 11.5–15.5)
WBC: 4.2 10*3/uL (ref 4.0–10.5)
nRBC: 0 % (ref 0.0–0.2)

## 2020-12-28 LAB — LACTIC ACID, PLASMA: Lactic Acid, Venous: 2 mmol/L (ref 0.5–1.9)

## 2020-12-28 LAB — PROTIME-INR
INR: 1.5 — ABNORMAL HIGH (ref 0.8–1.2)
Prothrombin Time: 17.6 seconds — ABNORMAL HIGH (ref 11.4–15.2)

## 2020-12-28 LAB — APTT: aPTT: 41 seconds — ABNORMAL HIGH (ref 24–36)

## 2020-12-28 MED ORDER — SODIUM CHLORIDE 0.9 % IV BOLUS
500.0000 mL | Freq: Once | INTRAVENOUS | Status: AC
  Administered 2020-12-28: 500 mL via INTRAVENOUS

## 2020-12-28 MED ORDER — PANTOPRAZOLE 80MG IVPB - SIMPLE MED
80.0000 mg | Freq: Once | INTRAVENOUS | Status: AC
  Administered 2020-12-29: 80 mg via INTRAVENOUS
  Filled 2020-12-28: qty 80

## 2020-12-28 MED ORDER — PANTOPRAZOLE INFUSION (NEW) - SIMPLE MED
8.0000 mg/h | INTRAVENOUS | Status: DC
  Administered 2020-12-29 – 2020-12-30 (×3): 8 mg/h via INTRAVENOUS
  Filled 2020-12-28: qty 80
  Filled 2020-12-28 (×2): qty 100

## 2020-12-28 MED ORDER — PANTOPRAZOLE SODIUM 40 MG IV SOLR: 40.0000 mg | Freq: Two times a day (BID) | INTRAVENOUS | Status: DC

## 2020-12-28 MED ORDER — SODIUM CHLORIDE 0.9 % IV SOLN: 10.0000 mL/h | Freq: Once | INTRAVENOUS | Status: DC

## 2020-12-28 NOTE — ED Triage Notes (Signed)
Patient BIB EMS for complaint of generalized weakness and dark stools x1 week. Patient is a dialysis patient, last run of dialysis today. Patient states he cannot walk and this is a new development. Patient is AxOx4, NAD.

## 2020-12-28 NOTE — ED Provider Notes (Signed)
[facility] Emergency Department Provider Note   ____________________________________________   I have reviewed the triage vital signs and the nursing notes.   HISTORY  Chief Complaint Weakness   History limited by: Not Limited   HPI Brent Glover is a 85 y.o. male who presents to the emergency department today because of concerns for worsening weakness.  Patient states he has been weak for quite some time.  However tonight appears that the weakness was so significant he was not even able to roll over in bed.  The patient states that he has noticed some dark stool recently.  He does have a hospitalization recently for GI bleed and anemia.  Patient does go to dialysis and had his session today.  He is unsure if it was the normal length.  He denies any pain currently.  Records reviewed. Per medical record review patient has a history of recent hospitalization for GI bleed and anemia.   Past Medical History:  Diagnosis Date   BPH (benign prostatic hyperplasia)    Chronic diastolic heart failure ([name])    COPD (chronic obstructive pulmonary disease) ([name])    Diabetes mellitus without complication ([name])    ESRD (end stage renal disease) ([place])    stage III   Hyperlipidemia    Hypertension     Patient Active Problem List   Diagnosis Date Noted   GI bleeding 11/11/2020   Type II diabetes mellitus with renal manifestations ([place]) 11/11/2020   Depression 11/11/2020   ESRD (end stage renal disease) ([name])    Chronic systolic CHF (congestive heart failure) ([name])    Anemia in ESRD (end-stage renal disease) ([name])    CAD (coronary artery disease)    Elevated troponin    Hypotension    Bradycardia    Thrombocytopenia ([name])    Anemia due to blood loss    Acute on chronic anemia 09/30/2017   Syncope and collapse 09/29/2017   Chronic kidney disease    CAD in native artery 11/23/2014   H/O coronary artery bypass surgery 10/28/2014   Acute non-ST elevation  myocardial infarction (NSTEMI) ([place]) 10/15/2014   Chronic kidney disease (CKD), stage III (moderate) ([name]) 03/17/2014   Creatinine elevation 03/11/2014   Chronic combined systolic and diastolic heart failure ([place]) 11/29/2012   Claudication ([place]) 06/25/2012   Chronic diastolic heart failure ([place])    Hypertension    Type 2 diabetes mellitus ([place]) 09/21/2011   Aneurysm ([place]) 10/07/2010   Arthritis, degenerative 09/09/2010   Hypercholesterolemia 04/04/2009   Benign hypertension 09/04/2008    Past Surgical History:  Procedure Laterality Date   ABDOMINAL AORTIC ENDOVASCULAR STENT GRAFT     CYSTOSCOPY     KNEE ARTHROPLASTY      Prior to Admission medications   Medication Sig Start Date End Date Taking? Authorizing Provider  acetaminophen (TYLENOL) 500 MG tablet Take 1,000 mg by mouth at bedtime.    [provider]  amLODipine (NORVASC) 10 MG tablet Take 0.5 tablets (5 mg total) by mouth daily. Patient not taking: No sig reported 06/25/12   Wellington Hampshire, MD  aspirin 81 MG tablet Take 81 mg by mouth daily.    [provider]  atorvastatin (LIPITOR) 80 MG tablet Take 80 mg by mouth daily. 08/17/17   [provider]  b complex-vitamin c-folic acid (NEPHRO-VITE) 0.8 MG TABS tablet Take 1 tablet by mouth daily.    [provider]  calcium acetate (PHOSLO) 667 MG capsule Take 1,334 mg by mouth 3 (three)  times daily with meals.    [provider]  cetirizine (ZYRTEC) 10 MG tablet Take 10 mg by mouth at bedtime.    [provider]  escitalopram (LEXAPRO) 20 MG tablet Take 20 mg by mouth daily.     [provider]  fluticasone (FLONASE) 50 MCG/ACT nasal spray Place 2 sprays into both nostrils daily as needed for allergies.    [provider]  furosemide (LASIX) 20 MG tablet Take 1 tablet (20 mg total) by mouth daily. Patient taking differently: Take 80 mg by mouth daily. 06/27/12   Wellington Hampshire, MD  lisinopril (ZESTRIL)  5 MG tablet Take 5 mg by mouth daily.    [provider]  Magnesium 250 MG TABS Take 250 mg by mouth daily.    [provider]  Melatonin 5 MG TABS Take 10 mg by mouth at bedtime as needed (sleep).    [provider]  metoprolol succinate (TOPROL-XL) 25 MG 24 hr tablet Take 25 mg by mouth daily.    [provider]  midodrine (PROAMATINE) 5 MG tablet Take 10 mg by mouth See admin instructions. Take 2 tablets (10mg ) by mouth halfway through dialysis or as directed    [provider]  rOPINIRole (REQUIP) 0.5 MG tablet Take 1.5 mg by mouth at bedtime.    [provider]  tamsulosin (FLOMAX) 0.4 MG CAPS Take 0.8 mg by mouth daily.    [provider]  Vitamin D, Ergocalciferol, (DRISDOL) 50000 units CAPS capsule Take 1 capsule (50,000 Units total) by mouth every Friday. Patient not taking: No sig reported 10/05/17   [name], MD    Allergies Montelukast, Trazodone, and Hydrocodone  Family History  Problem Relation Age of Onset   Diabetes Mellitus II Sister    Diabetes type II Brother     Social History Social History   Tobacco Use   Smoking status: Every Day    Packs/day: 1.00    Years: 60.00    Pack years: 60.00    Types: Cigarettes    Last attempt to quit: 10/11/2014    Years since quitting: 6.2   Smokeless tobacco: Never  Substance Use Topics   Alcohol use: No   Drug use: No    Review of Systems Constitutional: No fever/chills Eyes: No visual changes. ENT: No sore throat. Cardiovascular: Denies chest pain. Respiratory: Denies shortness of breath. Gastrointestinal: No abdominal pain. Positive for dark stools.  Genitourinary: Negative for dysuria. Musculoskeletal: Negative for back pain. Skin: Negative for rash. Neurological: Negative for headaches, focal weakness or numbness.  ____________________________________________   PHYSICAL EXAM:  VITAL SIGNS: ED Triage Vitals  Enc Vitals Group     BP 12/19/2020  2245 (!) 81/39     Pulse Rate 12/26/2020 2245 86     Resp 12/10/2020 2245 17     Temp 12/20/2020 2252 (!) 97.3 F (36.3 C)     Temp Source 12/08/2020 2252 Oral     SpO2 12/20/2020 2245 96 %     Weight 12/20/2020 2300 158 lb 15.9 oz (72.1 kg)     Height 01/05/2021 2300 5\' 7"  (1.702 m)    Constitutional: Alert and oriented.  Eyes: Conjunctivae are normal.  ENT      Head: Normocephalic and atraumatic.      Nose: No congestion/rhinnorhea.      Mouth/Throat: Mucous membranes are moist.      Neck: No stridor. Hematological/Lymphatic/Immunilogical: No cervical lymphadenopathy. Cardiovascular: Normal rate, regular rhythm.  No murmurs, rubs, or  gallops.  Respiratory: Normal respiratory effort without tachypnea nor retractions. Breath sounds are clear and equal bilaterally. No wheezes/rales/rhonchi. Gastrointestinal: Soft and non tender. No rebound. No guarding.  Genitourinary: Deferred Musculoskeletal: Normal range of motion in all extremities. No lower extremity edema. Neurologic:  Normal speech and language. No gross focal neurologic deficits are appreciated.  Skin:  Skin is warm, dry and intact. No rash noted. Psychiatric: Mood and affect are normal. Speech and behavior are normal. Patient exhibits appropriate insight and judgment.  ____________________________________________    LABS (pertinent positives/negatives)  BMP na 135, k 3.0, glu 68, cr 2.89 Lactic acid 2.0 CBC wbc 4.2, hgb 7.7, plt 54  ____________________________________________   EKG  I, Nance Pear, attending physician, personally viewed and interpreted this EKG  EKG Time: 2247 Rate: 69 Rhythm: junctional rhythm with ventricular bigeminy Axis: normal Intervals: qtc 501 QRS: narrow, q waves v1, II, III ST changes: no st elevation Impression: abnormal ekg ____________________________________________    RADIOLOGY  CXR Increased vascular congestion without  edema.  ____________________________________________   PROCEDURES  Procedures  ____________________________________________   INITIAL IMPRESSION / ASSESSMENT AND PLAN / ED COURSE  Pertinent labs & imaging results that were available during my care of the patient were reviewed by me and considered in my medical decision making (see chart for details).   Patient presented to the emergency department today with concerns for increased weakness as well as dark stool.  Patient was hypotensive here in the emergency department.  Given that he does have end-stage renal disease and is on dialysis I was hesitant to give the patient large fluid boluses.  Per chart review he recently had admission for GI bleed and anemia.  Blood work here does show drop in hemoglobin.  Because of this I talked to the patient about blood transfusion.  Will start Protonix.  Will plan on admission. ____________________________________________   FINAL CLINICAL IMPRESSION(S) / ED DIAGNOSES  Final diagnoses:  Weakness  Gastrointestinal hemorrhage, unspecified gastrointestinal hemorrhage type     Note: This dictation was prepared with Dragon dictation. Any transcriptional errors that result from this process are unintentional     Nance Pear, MD 12/29/20 1623

## 2020-12-29 ENCOUNTER — Encounter: Payer: Self-pay | Admitting: Family Medicine

## 2020-12-29 ENCOUNTER — Other Ambulatory Visit: Payer: Self-pay

## 2020-12-29 DIAGNOSIS — Z66 Do not resuscitate: Secondary | ICD-10-CM | POA: Diagnosis present

## 2020-12-29 DIAGNOSIS — I959 Hypotension, unspecified: Secondary | ICD-10-CM | POA: Diagnosis not present

## 2020-12-29 DIAGNOSIS — G9341 Metabolic encephalopathy: Secondary | ICD-10-CM | POA: Diagnosis not present

## 2020-12-29 DIAGNOSIS — I452 Bifascicular block: Secondary | ICD-10-CM | POA: Diagnosis present

## 2020-12-29 DIAGNOSIS — E873 Alkalosis: Secondary | ICD-10-CM | POA: Diagnosis not present

## 2020-12-29 DIAGNOSIS — Z515 Encounter for palliative care: Secondary | ICD-10-CM | POA: Diagnosis not present

## 2020-12-29 DIAGNOSIS — I083 Combined rheumatic disorders of mitral, aortic and tricuspid valves: Secondary | ICD-10-CM | POA: Diagnosis present

## 2020-12-29 DIAGNOSIS — I5042 Chronic combined systolic (congestive) and diastolic (congestive) heart failure: Secondary | ICD-10-CM | POA: Diagnosis not present

## 2020-12-29 DIAGNOSIS — D631 Anemia in chronic kidney disease: Secondary | ICD-10-CM | POA: Diagnosis present

## 2020-12-29 DIAGNOSIS — K5731 Diverticulosis of large intestine without perforation or abscess with bleeding: Secondary | ICD-10-CM | POA: Diagnosis present

## 2020-12-29 DIAGNOSIS — K922 Gastrointestinal hemorrhage, unspecified: Secondary | ICD-10-CM | POA: Diagnosis present

## 2020-12-29 DIAGNOSIS — D61818 Other pancytopenia: Secondary | ICD-10-CM | POA: Diagnosis present

## 2020-12-29 DIAGNOSIS — I132 Hypertensive heart and chronic kidney disease with heart failure and with stage 5 chronic kidney disease, or end stage renal disease: Secondary | ICD-10-CM | POA: Diagnosis present

## 2020-12-29 DIAGNOSIS — R0602 Shortness of breath: Secondary | ICD-10-CM | POA: Diagnosis not present

## 2020-12-29 DIAGNOSIS — D500 Iron deficiency anemia secondary to blood loss (chronic): Secondary | ICD-10-CM | POA: Diagnosis not present

## 2020-12-29 DIAGNOSIS — K921 Melena: Secondary | ICD-10-CM

## 2020-12-29 DIAGNOSIS — E1122 Type 2 diabetes mellitus with diabetic chronic kidney disease: Secondary | ICD-10-CM | POA: Diagnosis present

## 2020-12-29 DIAGNOSIS — K551 Chronic vascular disorders of intestine: Secondary | ICD-10-CM | POA: Diagnosis present

## 2020-12-29 DIAGNOSIS — F32A Depression, unspecified: Secondary | ICD-10-CM | POA: Diagnosis present

## 2020-12-29 DIAGNOSIS — Z221 Carrier of other intestinal infectious diseases: Secondary | ICD-10-CM | POA: Diagnosis not present

## 2020-12-29 DIAGNOSIS — D638 Anemia in other chronic diseases classified elsewhere: Secondary | ICD-10-CM | POA: Diagnosis not present

## 2020-12-29 DIAGNOSIS — I25810 Atherosclerosis of coronary artery bypass graft(s) without angina pectoris: Secondary | ICD-10-CM | POA: Diagnosis not present

## 2020-12-29 DIAGNOSIS — Z992 Dependence on renal dialysis: Secondary | ICD-10-CM | POA: Diagnosis not present

## 2020-12-29 DIAGNOSIS — J9601 Acute respiratory failure with hypoxia: Secondary | ICD-10-CM | POA: Diagnosis not present

## 2020-12-29 DIAGNOSIS — D62 Acute posthemorrhagic anemia: Secondary | ICD-10-CM | POA: Diagnosis present

## 2020-12-29 DIAGNOSIS — N186 End stage renal disease: Secondary | ICD-10-CM | POA: Diagnosis present

## 2020-12-29 DIAGNOSIS — E11649 Type 2 diabetes mellitus with hypoglycemia without coma: Secondary | ICD-10-CM | POA: Diagnosis present

## 2020-12-29 DIAGNOSIS — Z20822 Contact with and (suspected) exposure to covid-19: Secondary | ICD-10-CM | POA: Diagnosis present

## 2020-12-29 DIAGNOSIS — R188 Other ascites: Secondary | ICD-10-CM | POA: Diagnosis present

## 2020-12-29 DIAGNOSIS — G2581 Restless legs syndrome: Secondary | ICD-10-CM | POA: Diagnosis present

## 2020-12-29 DIAGNOSIS — R197 Diarrhea, unspecified: Secondary | ICD-10-CM | POA: Diagnosis not present

## 2020-12-29 DIAGNOSIS — R410 Disorientation, unspecified: Secondary | ICD-10-CM | POA: Diagnosis not present

## 2020-12-29 DIAGNOSIS — N2581 Secondary hyperparathyroidism of renal origin: Secondary | ICD-10-CM | POA: Diagnosis present

## 2020-12-29 DIAGNOSIS — R531 Weakness: Secondary | ICD-10-CM | POA: Diagnosis not present

## 2020-12-29 DIAGNOSIS — I779 Disorder of arteries and arterioles, unspecified: Secondary | ICD-10-CM | POA: Diagnosis not present

## 2020-12-29 DIAGNOSIS — J449 Chronic obstructive pulmonary disease, unspecified: Secondary | ICD-10-CM | POA: Diagnosis present

## 2020-12-29 DIAGNOSIS — Z8619 Personal history of other infectious and parasitic diseases: Secondary | ICD-10-CM | POA: Diagnosis present

## 2020-12-29 DIAGNOSIS — I5043 Acute on chronic combined systolic (congestive) and diastolic (congestive) heart failure: Secondary | ICD-10-CM | POA: Diagnosis present

## 2020-12-29 LAB — BASIC METABOLIC PANEL
Anion gap: 8 (ref 5–15)
BUN: 24 mg/dL — ABNORMAL HIGH (ref 8–23)
CO2: 32 mmol/L (ref 22–32)
Calcium: 7.5 mg/dL — ABNORMAL LOW (ref 8.9–10.3)
Chloride: 97 mmol/L — ABNORMAL LOW (ref 98–111)
Creatinine, Ser: 3.37 mg/dL — ABNORMAL HIGH (ref 0.61–1.24)
GFR, Estimated: 17 mL/min — ABNORMAL LOW (ref >60)
Glucose, Bld: 83 mg/dL (ref 70–99)
Potassium: 3.3 mmol/L — ABNORMAL LOW (ref 3.5–5.1)
Sodium: 137 mmol/L (ref 135–145)

## 2020-12-29 LAB — CBC
HCT: 23.5 % — ABNORMAL LOW (ref 39.0–52.0)
Hemoglobin: 7.7 g/dL — ABNORMAL LOW (ref 13.0–17.0)
MCH: 31.3 pg (ref 26.0–34.0)
MCHC: 32.8 g/dL (ref 30.0–36.0)
MCV: 95.5 fL (ref 80.0–100.0)
Platelets: 50 10*3/uL — ABNORMAL LOW (ref 150–400)
RBC: 2.46 MIL/uL — ABNORMAL LOW (ref 4.22–5.81)
RDW: 20.6 % — ABNORMAL HIGH (ref 11.5–15.5)
WBC: 4 10*3/uL (ref 4.0–10.5)
nRBC: 0 % (ref 0.0–0.2)

## 2020-12-29 LAB — RESP PANEL BY RT-PCR (FLU A&B, COVID) ARPGX2
Influenza A by PCR: NEGATIVE
Influenza B by PCR: NEGATIVE
SARS Coronavirus 2 by RT PCR: NEGATIVE

## 2020-12-29 LAB — HEMOGLOBIN AND HEMATOCRIT, BLOOD
HCT: 23.9 % — ABNORMAL LOW (ref 39.0–52.0)
HCT: 21.8 % — ABNORMAL LOW (ref 39.0–52.0)
HCT: 24.2 % — ABNORMAL LOW (ref 39.0–52.0)
Hemoglobin: 7.9 g/dL — ABNORMAL LOW (ref 13.0–17.0)
Hemoglobin: 7 g/dL — ABNORMAL LOW (ref 13.0–17.0)
Hemoglobin: 8 g/dL — ABNORMAL LOW (ref 13.0–17.0)

## 2020-12-29 LAB — LACTIC ACID, PLASMA: Lactic Acid, Venous: 1.6 mmol/L (ref 0.5–1.9)

## 2020-12-29 LAB — HEPATITIS B SURFACE ANTIGEN: Hepatitis B Surface Ag: NONREACTIVE

## 2020-12-29 LAB — PREPARE RBC (CROSSMATCH)

## 2020-12-29 MED ORDER — ONDANSETRON HCL 4 MG PO TABS: 4.0000 mg | ORAL_TABLET | Freq: Four times a day (QID) | ORAL | Status: DC | PRN

## 2020-12-29 MED ORDER — ESCITALOPRAM OXALATE 20 MG PO TABS
20.0000 mg | ORAL_TABLET | Freq: Every day | ORAL | Status: DC
  Administered 2020-12-29 – 2021-01-05 (×8): 20 mg via ORAL
  Filled 2020-12-29 (×9): qty 1

## 2020-12-29 MED ORDER — ACETAMINOPHEN 325 MG PO TABS
650.0000 mg | ORAL_TABLET | Freq: Four times a day (QID) | ORAL | Status: DC | PRN
  Administered 2020-12-30 – 2021-01-05 (×5): 650 mg via ORAL
  Filled 2020-12-29 (×5): qty 2

## 2020-12-29 MED ORDER — MAGNESIUM OXIDE -MG SUPPLEMENT 400 (240 MG) MG PO TABS: 200.0000 mg | ORAL_TABLET | Freq: Every day | ORAL | Status: DC

## 2020-12-29 MED ORDER — CALCIUM ACETATE (PHOS BINDER) 667 MG PO CAPS
1334.0000 mg | ORAL_CAPSULE | Freq: Three times a day (TID) | ORAL | Status: DC
  Administered 2020-12-29 – 2020-12-31 (×6): 1334 mg via ORAL
  Filled 2020-12-29 (×8): qty 2

## 2020-12-29 MED ORDER — FLUTICASONE PROPIONATE 50 MCG/ACT NA SUSP
2.0000 | Freq: Every day | NASAL | Status: DC | PRN
  Filled 2020-12-29: qty 16

## 2020-12-29 MED ORDER — EPOETIN ALFA 10000 UNIT/ML IJ SOLN
4000.0000 [IU] | INTRAMUSCULAR | Status: DC
  Administered 2020-12-30 – 2021-01-04 (×3): 4000 [IU] via INTRAVENOUS
  Filled 2020-12-29 (×2): qty 0.4

## 2020-12-29 MED ORDER — ACETAMINOPHEN 650 MG RE SUPP
650.0000 mg | Freq: Four times a day (QID) | RECTAL | Status: DC | PRN
  Filled 2020-12-29: qty 1

## 2020-12-29 MED ORDER — SODIUM CHLORIDE 0.9 % IV SOLN: INTRAVENOUS | Status: DC

## 2020-12-29 MED ORDER — MELATONIN 5 MG PO TABS
10.0000 mg | ORAL_TABLET | Freq: Every evening | ORAL | Status: DC | PRN
  Administered 2020-12-31 – 2021-01-03 (×4): 10 mg via ORAL
  Filled 2020-12-29 (×4): qty 2

## 2020-12-29 MED ORDER — SODIUM CHLORIDE 0.9 % IV BOLUS
250.0000 mL | Freq: Once | INTRAVENOUS | Status: AC
  Administered 2020-12-29: 250 mL via INTRAVENOUS

## 2020-12-29 MED ORDER — METOPROLOL SUCCINATE ER 50 MG PO TB24: 25.0000 mg | ORAL_TABLET | Freq: Every day | ORAL | Status: DC

## 2020-12-29 MED ORDER — LORATADINE 10 MG PO TABS
10.0000 mg | ORAL_TABLET | Freq: Every day | ORAL | Status: DC
  Administered 2020-12-29 – 2021-01-05 (×8): 10 mg via ORAL
  Filled 2020-12-29 (×8): qty 1

## 2020-12-29 MED ORDER — DEXTROSE 50 % IV SOLN
25.0000 mL | Freq: Once | INTRAVENOUS | Status: AC
  Administered 2020-12-29: 25 mL via INTRAVENOUS
  Filled 2020-12-29: qty 50

## 2020-12-29 MED ORDER — SODIUM CHLORIDE 0.9% IV SOLUTION: Freq: Once | INTRAVENOUS | Status: DC

## 2020-12-29 MED ORDER — MIDODRINE HCL 5 MG PO TABS
5.0000 mg | ORAL_TABLET | Freq: Three times a day (TID) | ORAL | Status: DC
  Administered 2020-12-29 – 2021-01-04 (×15): 5 mg via ORAL
  Filled 2020-12-29 (×19): qty 1

## 2020-12-29 MED ORDER — ONDANSETRON HCL 4 MG/2ML IJ SOLN
4.0000 mg | Freq: Four times a day (QID) | INTRAMUSCULAR | Status: DC | PRN
  Administered 2021-01-04: 4 mg via INTRAVENOUS
  Filled 2020-12-29: qty 2

## 2020-12-29 MED ORDER — CHLORHEXIDINE GLUCONATE CLOTH 2 % EX PADS
6.0000 | MEDICATED_PAD | Freq: Every day | CUTANEOUS | Status: DC
  Administered 2020-12-30 – 2021-01-05 (×6): 6 via TOPICAL

## 2020-12-29 MED ORDER — MELATONIN 3 MG PO TABS: 3.0000 mg | ORAL_TABLET | Freq: Every evening | ORAL | Status: DC | PRN

## 2020-12-29 MED ORDER — ATORVASTATIN CALCIUM 80 MG PO TABS
80.0000 mg | ORAL_TABLET | Freq: Every day | ORAL | Status: DC
  Administered 2020-12-29 – 2021-01-03 (×6): 80 mg via ORAL
  Filled 2020-12-29 (×6): qty 1

## 2020-12-29 MED ORDER — ROPINIROLE HCL 1 MG PO TABS
1.5000 mg | ORAL_TABLET | Freq: Every day | ORAL | Status: DC
  Administered 2020-12-29 – 2021-01-03 (×6): 1.5 mg via ORAL
  Filled 2020-12-29 (×3): qty 2
  Filled 2020-12-29: qty 1.5
  Filled 2020-12-29 (×5): qty 2

## 2020-12-29 MED ORDER — LISINOPRIL 5 MG PO TABS: 5.0000 mg | ORAL_TABLET | Freq: Every day | ORAL | Status: DC

## 2020-12-29 MED ORDER — TAMSULOSIN HCL 0.4 MG PO CAPS
0.8000 mg | ORAL_CAPSULE | Freq: Every day | ORAL | Status: DC
  Administered 2020-12-29 – 2021-01-05 (×8): 0.8 mg via ORAL
  Filled 2020-12-29 (×9): qty 2

## 2020-12-29 MED ORDER — RENA-VITE PO TABS
1.0000 | ORAL_TABLET | Freq: Every day | ORAL | Status: DC
  Administered 2020-12-29 – 2021-01-03 (×6): 1 via ORAL
  Filled 2020-12-29 (×8): qty 1

## 2020-12-29 NOTE — Assessment & Plan Note (Addendum)
Appreciate nephrology assistance. Past few weeks patient is having difficulty with blood pressure getting hemodialysis. HD per nephrology.

## 2020-12-29 NOTE — Assessment & Plan Note (Addendum)
Acute on chronic. Chronically low around 100.  On presentation platelet is in 71s.  W40 and folic acid checked recently were unremarkable.  May require hematology input.

## 2020-12-29 NOTE — H&P (Addendum)
[place]   PATIENT NAME: Brent Glover    MR#:  [id number]  DATE OF BIRTH:  06/24/1935  DATE OF ADMISSION:  12/10/2020  PRIMARY CARE PHYSICIAN: Christen Bame, DO   Patient is coming from: Home  REQUESTING/REFERRING PHYSICIAN: Nance Pear, MD  CHIEF COMPLAINT:   Chief Complaint  Patient presents with   Weakness    HISTORY OF PRESENT ILLNESS:  Brent Glover is a 85 y.o. Caucasian male with medical history significant for chronic diastolic CHF, COPD, type II diabetes mellitus, end-stage renal disease on hemodialysis, hypertension and dyslipidemia, who presented to the emergency room with acute onset of generalized weakness which has been worsening over the last week with melanotic stools.  He has been having cold chills and admitted to diarrhea.  No nausea or vomiting.  No chest pain or palpitations.  No cough or wheezing or dyspnea.  Has been feeling fatigued and tired.  No other bleeding diathesis.  ED Course: With the patient came to the ER, blood pressure was 81/39 with a MAP of 52 and otherwise normal vital signs.  Labs revealed hypokalemia of 3 and hypochloremia of 95 with a CO2 33 and blood glucose of 68.  BUN was 22 and creatinine 2.89 with a calcium 7.5 and albumin 2 with total protein of 5.4.  Lactic acid was 2 and later 1.6 CBC showed anemia with hemoglobin of 7.7 hematocrit 24 down from 9.4 and 29.8 on 12/07/2020.  Influenza antigens and COVID-19 PCR came back negative.  The patient was typed and crossmatched.  His blood group was O+.  Blood cultures were drawn.  Stool Hemoccult was positive.  The ER doctor.  EKG showed junctional rhythm with rate 69 with ventricular bigeminy and right bundle branch block with left posterior fascicle block.  Imaging: Chest x-ray showed increased vascular congestion without interstitial pulmonary edema.  The patient was given IV Protonix bolus and infusion.  He was also given 500 mill IV normal saline bolus.  He will  be admitted to a progressive unit bed for further evaluation and management.  PAST MEDICAL HISTORY:   Past Medical History:  Diagnosis Date   BPH (benign prostatic hyperplasia)    Chronic diastolic heart failure ([name])    COPD (chronic obstructive pulmonary disease) ([name])    Diabetes mellitus without complication ([name])    ESRD (end stage renal disease) ([place])    stage III   Hyperlipidemia    Hypertension     PAST SURGICAL HISTORY:   Past Surgical History:  Procedure Laterality Date   ABDOMINAL AORTIC ENDOVASCULAR STENT GRAFT     CYSTOSCOPY     KNEE ARTHROPLASTY      SOCIAL HISTORY:   Social History   Tobacco Use   Smoking status: Every Day    Packs/day: 1.00    Years: 60.00    Pack years: 60.00    Types: Cigarettes    Last attempt to quit: 10/11/2014    Years since quitting: 6.2   Smokeless tobacco: Never  Substance Use Topics   Alcohol use: No    FAMILY HISTORY:   Family History  Problem Relation Age of Onset   Diabetes Mellitus II Sister    Diabetes type II Brother     DRUG ALLERGIES:   Allergies  Allergen Reactions   Montelukast Shortness Of Breath   Trazodone Other (See Comments)    "it hypes me up instead of putting me to sleep"   Hydrocodone Nausea And Vomiting  REVIEW OF SYSTEMS:   ROS As per history of present illness. All pertinent systems were reviewed above. Constitutional, HEENT, cardiovascular, respiratory, GI, GU, musculoskeletal, neuro, psychiatric, endocrine, integumentary and hematologic systems were reviewed and are otherwise negative/unremarkable except for positive findings mentioned above in the HPI.   MEDICATIONS AT HOME:   Prior to Admission medications   Medication Sig Start Date End Date Taking? Authorizing Provider  acetaminophen (TYLENOL) 500 MG tablet Take 1,000 mg by mouth at bedtime.    [provider]  amLODipine (NORVASC) 10 MG tablet Take 0.5 tablets (5 mg total) by mouth daily. Patient not taking: No sig  reported 06/25/12   Wellington Hampshire, MD  aspirin 81 MG tablet Take 81 mg by mouth daily.    [provider]  atorvastatin (LIPITOR) 80 MG tablet Take 80 mg by mouth daily. 08/17/17   [provider]  b complex-vitamin c-folic acid (NEPHRO-VITE) 0.8 MG TABS tablet Take 1 tablet by mouth daily.    [provider]  calcium acetate (PHOSLO) 667 MG capsule Take 1,334 mg by mouth 3 (three) times daily with meals.    [provider]  cetirizine (ZYRTEC) 10 MG tablet Take 10 mg by mouth at bedtime.    [provider]  escitalopram (LEXAPRO) 20 MG tablet Take 20 mg by mouth daily.     [provider]  fluticasone (FLONASE) 50 MCG/ACT nasal spray Place 2 sprays into both nostrils daily as needed for allergies.    [provider]  furosemide (LASIX) 20 MG tablet Take 1 tablet (20 mg total) by mouth daily. Patient taking differently: Take 80 mg by mouth daily. 06/27/12   Wellington Hampshire, MD  lisinopril (ZESTRIL) 5 MG tablet Take 5 mg by mouth daily.    [provider]  Magnesium 250 MG TABS Take 250 mg by mouth daily.    [provider]  Melatonin 5 MG TABS Take 10 mg by mouth at bedtime as needed (sleep).    [provider]  metoprolol succinate (TOPROL-XL) 25 MG 24 hr tablet Take 25 mg by mouth daily.    [provider]  midodrine (PROAMATINE) 5 MG tablet Take 10 mg by mouth See admin instructions. Take 2 tablets (10mg ) by mouth halfway through dialysis or as directed    [provider]  rOPINIRole (REQUIP) 0.5 MG tablet Take 1.5 mg by mouth at bedtime.    [provider]  tamsulosin (FLOMAX) 0.4 MG CAPS Take 0.8 mg by mouth daily.    [provider]  Vitamin D, Ergocalciferol, (DRISDOL) 50000 units CAPS capsule Take 1 capsule (50,000 Units total) by mouth every Friday. Patient not taking: No sig reported 10/05/17   [name], MD      VITAL SIGNS:  Blood pressure (!) 87/46,  pulse (!) 26, temperature (!) 97.3 F (36.3 C), temperature source Oral, resp. rate 17, height 5\' 7"  (1.702 m), weight 72.1 kg, SpO2 98 %.  PHYSICAL EXAMINATION:  Physical Exam  GENERAL:  85 y.o.-year-old Caucasian male patient lying in the bed with no acute distress.  EYES: Pupils equal, round, reactive to light and accommodation. No scleral icterus.  Positive pallor.. Extraocular muscles intact.  HEENT: Head atraumatic, normocephalic. Oropharynx and nasopharynx clear.  NECK:  Supple, no jugular venous distention. No thyroid enlargement, no tenderness.  LUNGS: Normal breath sounds bilaterally, no wheezing, rales,rhonchi or crepitation. No use of accessory muscles of respiration.  CARDIOVASCULAR: Regular rate and rhythm, S1, S2 normal. No murmurs, rubs,  or gallops.  ABDOMEN: Soft, nondistended, nontender. Bowel sounds present. No organomegaly or mass.  EXTREMITIES: No pedal edema, cyanosis, or clubbing.  NEUROLOGIC: Cranial nerves II through XII are intact. Muscle strength 5/5 in all extremities. Sensation intact. Gait not checked.  PSYCHIATRIC: The patient is alert and oriented x 3.  Normal affect and good eye contact. SKIN: No obvious rash, lesion, or ulcer.   LABORATORY PANEL:   CBC Recent Labs  Lab 01/04/2021 2308  WBC 4.2  HGB 7.7*  HCT 24.0*  PLT 54*   ------------------------------------------------------------------------------------------------------------------  Chemistries  Recent Labs  Lab 12/16/2020 2308  NA 135  K 3.0*  CL 95*  CO2 33*  GLUCOSE 68*  BUN 22  CREATININE 2.89*  CALCIUM 7.5*  AST 33  ALT 13  ALKPHOS 110  BILITOT 1.0   ------------------------------------------------------------------------------------------------------------------  Cardiac Enzymes No results for input(s): TROPONINI in the last 168 hours. ------------------------------------------------------------------------------------------------------------------  RADIOLOGY:  DG Chest  Port 1 View  Result Date: 12/12/2020 CLINICAL DATA:  Possible sepsis EXAM: PORTABLE CHEST 1 VIEW COMPARISON:  12/07/2020 FINDINGS: Cardiac shadow is mildly enlarged but stable. Aortic calcifications are noted. Postsurgical changes are seen. Lungs are well aerated bilaterally. Skin fold is noted over the left chest. Vascular congestion is noted without interstitial edema. Filtrate is seen. IMPRESSION: Increasing vascular congestion without interstitial edema. Electronically Signed   By: Inez Catalina M.D.   On: 12/29/2020 23:13      IMPRESSION AND PLAN:  Principal Problem:   GI bleeding  1.  GI bleeding with subsequent acute blood loss anemia on top of anemia of chronic disease. - The patient be admitted to a progressive unit bed. - He was typed and crossmatch will be transfused 2 units of packed red blood cells. - We will follow posttransfusion H&H. - We will continue him on IV Protonix drip. - We will obtain a GI consultation. - I notified Dr. Haig Prophet about patient.  2.  End-stage renal disease on hemodialysis. - Nephrology follow-up consultation will be obtained. - I notified Dr. Candiss Norse about the patient.  3.  Hypotension. - This like secondary to volume depletion. - He will be given gentle boluses of IV normal saline especially given his end-stage renal disease and and chronic systolic and diastolic CHF that he is currently compensated. - We will hold off his antihypertensives.  4.  BPH. - We will continue Flomax.  5.  Restless leg syndrome. - We will continue Requip.  6.  Depression. - We will continue Lexapro.  7.  Dyslipidemia. - We will continue statin therapy.  DVT prophylaxis: SCDs.  Medical prophylaxis currently contraindicated due to GI bleeding. Code Status: full code. Family Communication:  The plan of care was discussed in details with the patient (and family). I answered all questions. The patient agreed to proceed with the above mentioned plan. Further  management will depend upon hospital course. Disposition Plan: Back to previous home environment Consults called: GI and nephrology. All the records are reviewed and case discussed with ED provider.  Status is: Inpatient   Remains inpatient appropriate because:Ongoing diagnostic testing needed not appropriate for outpatient work up, Unsafe d/c plan, IV treatments appropriate due to intensity of illness or inability to take PO, and Inpatient level of care appropriate due to severity of illness   Dispo: The patient is from: Home              Anticipated d/c is to: Home  Patient currently is not medically stable to d/c.              Difficult to place patient: No  TOTAL TIME TAKING CARE OF THIS PATIENT: 55 minutes.     Christel Mormon M.D on 12/29/2020 at 1:58 AM  [facility]   From 7 PM-7 AM, contact night-coverage [email or web address]  CC: Primary care physician; Christen Bame, DO

## 2020-12-29 NOTE — Consult Note (Signed)
Consultation  Referring Provider:  Hospitalist    Admit date: 06/04/1935 Consult date        11/23 Reason for Consultation:     Anemia and dark stools        Isaih L Glover is a 85 y.o. gentleman with history of HFrEF (EF 40%) with NYHA Class III symptoms at baseline, COPD, and ESRD on hemodialysis here for acute on chronic anemia (7, baseline is 8-9) with black diarrhea. Patient presented a few weeks ago with similar symptoms and was found to have c. Diff. He notes his diarrhea has recurred and was supposed to get outpatient c. Diff testing but I do not see if this was done. He denies any NSAIDS or blood thinners besides heparin with dialysis. He has been extremely weak. He has been having more frequent falls. He presented to the ED in June with anemia. He takes PPI daily. He lives at home with his wife. On chart review with his PCP and cardiology team, palliative care/hospice was recommended due to his medical comorbidities. He endorses shortness of breath just laying in bed to me.   Past Medical History:  Diagnosis Date   BPH (benign prostatic hyperplasia)    Chronic diastolic heart failure ([name])    COPD (chronic obstructive pulmonary disease) ([name])    Diabetes mellitus without complication ([name])    ESRD (end stage renal disease) ([place])    stage III   Hyperlipidemia    Hypertension     Past Surgical History:  Procedure Laterality Date   ABDOMINAL AORTIC ENDOVASCULAR STENT GRAFT     CYSTOSCOPY     KNEE ARTHROPLASTY      Family History  Problem Relation Age of Onset   Diabetes Mellitus II Sister    Diabetes type II Brother     Social History   Tobacco Use   Smoking status: Every Day    Packs/day: 1.00    Years: 60.00    Pack years: 60.00    Types: Cigarettes    Last attempt to quit: 10/11/2014    Years since quitting: 6.2   Smokeless tobacco: Never  Substance Use Topics   Alcohol use: No   Drug use: No    Prior to Admission medications   Medication Sig Start Date  End Date Taking? Authorizing Provider  acetaminophen (TYLENOL) 500 MG tablet Take 1,000 mg by mouth at bedtime.   Yes [provider]  aspirin 81 MG tablet Take 81 mg by mouth daily.   Yes [provider]  atorvastatin (LIPITOR) 80 MG tablet Take 80 mg by mouth daily. 08/17/17  Yes [provider]  b complex-vitamin c-folic acid (NEPHRO-VITE) 0.8 MG TABS tablet Take 1 tablet by mouth daily.   Yes [provider]  calcium acetate (PHOSLO) 667 MG capsule Take 1,334 mg by mouth 3 (three) times daily with meals.   Yes [provider]  cetirizine (ZYRTEC) 10 MG tablet Take 10 mg by mouth at bedtime.   Yes [provider]  escitalopram (LEXAPRO) 20 MG tablet Take 20 mg by mouth daily.    Yes [provider]  fluticasone (FLONASE) 50 MCG/ACT nasal spray Place 2 sprays into both nostrils daily as needed for allergies.   Yes [provider]  furosemide (LASIX) 40 MG tablet Take 80 mg by mouth daily.   Yes [provider]  lisinopril (ZESTRIL) 5 MG tablet Take 5 mg by mouth daily.   Yes [provider]  magnesium oxide (MAG-OX) 400 MG  tablet Take 400 mg by mouth daily.   Yes [provider]  Melatonin 5 MG TABS Take 10 mg by mouth at bedtime as needed (sleep).   Yes [provider]  metoprolol succinate (TOPROL-XL) 25 MG 24 hr tablet Take 25 mg by mouth daily.   Yes [provider]  midodrine (PROAMATINE) 5 MG tablet Take 10 mg by mouth daily.   Yes [provider]  rOPINIRole (REQUIP) 0.5 MG tablet Take 1.5 mg by mouth at bedtime.   Yes [provider]  tamsulosin (FLOMAX) 0.4 MG CAPS Take 0.8 mg by mouth daily.   Yes [provider]    Current Facility-Administered Medications  Medication Dose Route Frequency Provider Last Rate Last Admin   0.9 %  sodium chloride infusion  10 mL/hr Intravenous Once Nance Pear, MD   Held at 12/09/2020 2349   0.9 %  sodium  chloride infusion   Intravenous Continuous Mansy, Jan A, MD 100 mL/hr at 12/29/20 0522 New Bag at 12/29/20 0522   acetaminophen (TYLENOL) tablet 650 mg  650 mg Oral Q6H PRN Mansy, Jan A, MD       Or   acetaminophen (TYLENOL) suppository 650 mg  650 mg Rectal Q6H PRN Mansy, Jan A, MD       atorvastatin (LIPITOR) tablet 80 mg  80 mg Oral Daily Mansy, Jan A, MD   80 mg at 12/29/20 1202   calcium acetate (PHOSLO) capsule 1,334 mg  1,334 mg Oral TID WC Mansy, Jan A, MD   1,334 mg at 12/29/20 1219   [START ON 12/30/2020] Chlorhexidine Gluconate Cloth 2 % PADS 6 each  6 each Topical Q0600 Murlean Iba, MD       [START ON 12/30/2020] epoetin alfa (EPOGEN) injection 4,000 Units  4,000 Units Intravenous Q T,Th,Sa-HD Murlean Iba, MD       escitalopram (LEXAPRO) tablet 20 mg  20 mg Oral Daily Mansy, Jan A, MD   20 mg at 12/29/20 1219   fluticasone (FLONASE) 50 MCG/ACT nasal spray 2 spray  2 spray Each Nare Daily PRN Mansy, Jan A, MD       loratadine (CLARITIN) tablet 10 mg  10 mg Oral Daily Mansy, Jan A, MD   10 mg at 12/29/20 1201   melatonin tablet 10 mg  10 mg Oral QHS PRN Mansy, Jan A, MD       multivitamin (RENA-VIT) tablet 1 tablet  1 tablet Oral QHS Mansy, Jan A, MD       ondansetron [facility]) tablet 4 mg  4 mg Oral Q6H PRN Mansy, Jan A, MD       Or   ondansetron [facility]) injection 4 mg  4 mg Intravenous Q6H PRN Mansy, Jan A, MD       [START ON 01/01/2021] pantoprazole (PROTONIX) injection 40 mg  40 mg Intravenous Q12H Nance Pear, MD       pantoprozole (PROTONIX) 80 mg /NS 100 mL infusion  8 mg/hr Intravenous Continuous Nance Pear, MD 10 mL/hr at 12/29/20 1226 8 mg/hr at 12/29/20 1226   rOPINIRole (REQUIP) tablet 1.5 mg  1.5 mg Oral QHS Mansy, Jan A, MD       tamsulosin [facility]) capsule 0.8 mg  0.8 mg Oral Daily Mansy, Jan A, MD   0.8 mg at 12/29/20 1201    Allergies as of 12/30/2020 - Review Complete 01/01/2021  Allergen Reaction Noted   Montelukast Shortness Of Breath 02/10/2015    Trazodone Other (See Comments) 02/10/2015   Hydrocodone Nausea  And Vomiting 02/10/2015     Review of Systems:    All systems reviewed and negative except where noted in HPI.  Review of Systems  Constitutional:  Positive for malaise/fatigue. Negative for fever.  HENT:  Positive for hearing loss.   Respiratory:  Positive for cough and shortness of breath.   Cardiovascular:  Negative for chest pain.  Gastrointestinal:  Positive for diarrhea. Negative for blood in stool.  Musculoskeletal:  Positive for joint pain.  Skin:  Negative for rash.  Neurological:  Positive for weakness.  Endo/Heme/Allergies:  Bruises/bleeds easily.  Psychiatric/Behavioral:  Negative for substance abuse.   All other systems reviewed and are negative.     Physical Exam:  Vital signs in last 24 hours: Temp:  [97.3 F (36.3 C)-99.4 F (37.4 C)] 99.2 F (37.3 C) (11/23 1100) Pulse Rate:  [26-88] 60 (11/23 1100) Resp:  [13-20] 17 (11/23 1100) BP: (65-99)/(34-61) 99/60 (11/23 1100) SpO2:  [91 %-100 %] 97 % (11/23 1100) Weight:  [72.1 kg] 72.1 kg (11/22 2300) Last BM Date: 12/27/20 General:   Chronically ill appearing Head:  Normocephalic, has some facial bruising. Eyes:   No icterus.   Conjunctiva pink. Mouth: Dry mucous membranes Lungs:  Mild respiratory distress Abdomen:   Flat, soft, nondistended, nontender Msk:  Symmetrical without gross deformities. Neurologic:  Alert and  oriented x4;  Cranial nerves II-XII intact.  Skin:  Scattered bruising Psych:  Alert and cooperative. Normal affect.  LAB RESULTS: Recent Labs    01/02/2021 2308 12/29/20 0523 12/29/20 1103  WBC 4.2 4.0  --   HGB 7.7* 7.7* 7.9*  HCT 24.0* 23.5* 23.9*  PLT 54* 50*  --    BMET Recent Labs    12/22/2020 2308 12/29/20 0523  NA 135 137  K 3.0* 3.3*  CL 95* 97*  CO2 33* 32  GLUCOSE 68* 83  BUN 22 24*  CREATININE 2.89* 3.37*  CALCIUM 7.5* 7.5*   LFT Recent Labs    12/15/2020 2308  PROT 5.4*  ALBUMIN 2.0*  AST 33   ALT 13  ALKPHOS 110  BILITOT 1.0   PT/INR Recent Labs    12/21/2020 2308  LABPROT 17.6*  INR 1.5*    STUDIES: DG Chest Port 1 View  Result Date: 12/10/2020 CLINICAL DATA:  Possible sepsis EXAM: PORTABLE CHEST 1 VIEW COMPARISON:  12/07/2020 FINDINGS: Cardiac shadow is mildly enlarged but stable. Aortic calcifications are noted. Postsurgical changes are seen. Lungs are well aerated bilaterally. Skin fold is noted over the left chest. Vascular congestion is noted without interstitial edema. Filtrate is seen. IMPRESSION: Increasing vascular congestion without interstitial edema. Electronically Signed   By: Inez Catalina M.D.   On: 12/14/2020 23:13       Impression / Plan:   85 y/o gentleman with history of HFrEF (EF 40%) with NYHA Class III symptoms at baseline, COPD, and ESRD on hemodialysis here for acute on chronic anemia (7.9, baseline is 8-9) with black diarrhea concerning for recurrent c. Diff infection. He is basically at his baseline hemoglobin. Given significant comorbities and at baseline hemoglobin, no plan for any endoscopic procedures. I would consider palliative care discussions given frailty, recurrent hospital admissions, and medical comorbidities.  - check c. Diff - daily cbc's - transfuse hemoglobin for hemoglobin < 7 - palliative care consult - monitor for hemodynamically significant GI bleeding  [facility] will be covering for Thanksgiving and holiday weekend.  Raylene Miyamoto MD, MPH [place]

## 2020-12-29 NOTE — Progress Notes (Addendum)
[facility] Progress Note  Patient: Brent Glover    KPV:[id number]  DOA: 12/22/2020    Date of Service: the patient was seen and examined on 12/29/2020  Brief hospital course: 11/22 presented with generalized weakness.  Found to have symptomatic anemia and dehydration. 11/23 nephrology consulted for ESRD.  GI consulted for concern for GI bleed.  C. difficile test ordered.  Assessment and Plan: * GI bleeding Hemoglobin was positive.  Likely secondary to recent C. difficile colitis as well as patient's report of diarrhea. No evidence of acute GI bleed for now.  Hypotension Presents with generalized weakness.  Blood pressure was 80/30 in the ER.  Hemoglobin dropped from 9.4-7.7.  Hemoccult stool positive. Holding antihypertensive medication.  Gentle IV hydration initiated.  Monitor for now.  Midodrine.  Anemia in ESRD (end-stage renal disease) ([place]) Management per nephrology.  Acute on chronic anemia Recent hemoglobin around 9. Currently hemoglobin around 7. We will transfuse 1 get was a transient GI was consulted.  Recommended no intervention is necessary.  Patient did not actually have diet.  Monitor recommendation.  CAD (coronary artery disease) Antiplatelet medications are on hold.  Chronic combined systolic and diastolic heart failure ([name]) Volume status appear stable.  Monitor.  Holding all antihypertensive as well as diuretic medication.  ESRD (end stage renal disease) [facility]) Appreciate nephrology assistance. Currently receiving gentle IV hydration. HD per nephrology.  H/O Clostridium difficile infection C. difficile ordered due to reports of diarrhea.  Monitor.  Although suspect that the patient is actually overusing his stool softener which is causing diarrhea.  Thrombocytopenia ([place]) Acute on chronic. Chronically occluded started around 100.  On presentation platelet is 54.  Last platelet check was 67.  Monitor.   Subjective: No acute complaint.  No  nausea no vomiting.  No diarrhea reported right now.  No chest pain.  No abdominal pain.  Objective: Vital signs were reviewed and unremarkable.  Exam: General: Appear in mild distress, no Rash; Oral Mucosa Clear, moist. no Abnormal Neck Mass Or lumps, Conjunctiva normal  Cardiovascular: S1 and S2 Present, aortic systolic  Murmur, Respiratory: good respiratory effort, Bilateral Air entry present and CTA, no Crackles, no wheezes Abdomen: Bowel Sound present, Soft and no tenderness Extremities: no Pedal edema Neurology: alert and oriented to time, place, and person affect appropriate. no new focal deficit Gait not checked due to patient safety concerns     Data Reviewed: My review of labs, imaging, notes and other tests is significant for     hemoglobin trending down to 7.  Disposition:  Status is: Inpatient  Remains inpatient appropriate because: Getting PRBC transfusion.  Further work-up for bleeding.   Family Communication: No family at bedside.  DVT Prophylaxis: SCDs Start: 12/29/20 0045   Time spent: 35 minutes.   Author: Berle Mull  12/29/2020 7:45 PM  To reach On-call, see care teams to locate the attending and reach out via [email or web address]. Between 7PM-7AM, please contact night-coverage If you still have difficulty reaching the attending provider, please page the [facility] (Director on Call) for [facility] on amion for assistance.

## 2020-12-29 NOTE — Assessment & Plan Note (Addendum)
Volume status appear elevated. Bilateral edema as well as vascular congestion. Chest x-ray also shows vascular congestion. Patient is unable to tolerate ultrafiltration with hemodialysis which is likely the cause of volume overload. Completed full session of HD on 11/26 finally.

## 2020-12-29 NOTE — Assessment & Plan Note (Addendum)
Presents with generalized weakness. Blood pressure was 80/30 in the ER.  Hemoglobin dropped from 9.4-7.7.  Hemoccult stool positive. Holding antihypertensive medication. Patient received gentle IV hydration, now on hold. Continue midodrine.  May require higher dose during hemodialysis.

## 2020-12-29 NOTE — Assessment & Plan Note (Signed)
-   Continue sliding scale insulin

## 2020-12-29 NOTE — Progress Notes (Signed)
[facility], [place] 12/29/20  Subjective:   LOS: 0  Patient known to our practice from previous admissions. This time he is admitted for generalized weakness, loose stools which are dark in color.  He denies nausea vomiting or fever.  No cough or shortness of breath. Last HD was yesterday as outpatient Upon initial presentation, patient's blood pressure was noted to be low This morning also has been mouth is dry Hemoglobin was noted to be low Patient also noted to have bradycardia.  Objective:  Vital signs in last 24 hours:  Temp:  [97.3 F (36.3 C)-99.4 F (37.4 C)] 99.4 F (37.4 C) (11/23 0850) Pulse Rate:  [26-88] 55 (11/23 0850) Resp:  [13-20] 18 (11/23 0850) BP: (65-99)/(34-61) 99/53 (11/23 0850) SpO2:  [91 %-100 %] 96 % (11/23 0850) Weight:  [72.1 kg] 72.1 kg (11/22 2300)  Weight change:  Filed Weights   12/17/2020 2300  Weight: 72.1 kg    Intake/Output:    Intake/Output Summary (Last 24 hours) at 12/29/2020 0915 Last data filed at 12/29/2020 0415 Gross per 24 hour  Intake 380 ml  Output --  Net 380 ml     Physical Exam: General: Frail, elderly gentleman, laying in the bed  HEENT Anicteric, mouth is dry  Pulm/lungs Normal breathing effort  CVS/Heart No rub or gallop  Abdomen:   soft, nontender  Extremities: No edema  Neurologic: Alert, oriented  Skin: No acute rashes  Access: Left forearm AV fistula       Basic Metabolic Panel:  Recent Labs  Lab 01/05/2021 2308 12/29/20 0523  NA 135 137  K 3.0* 3.3*  CL 95* 97*  CO2 33* 32  GLUCOSE 68* 83  BUN 22 24*  CREATININE 2.89* 3.37*  CALCIUM 7.5* 7.5*     CBC: Recent Labs  Lab 12/09/2020 2308 12/29/20 0523  WBC 4.2 4.0  NEUTROABS 2.9  --   HGB 7.7* 7.7*  HCT 24.0* 23.5*  MCV 100.8* 95.5  PLT 54* 50*      Lab Results  Component Value Date   HEPBSAG NON REACTIVE 11/13/2020   HEPBSAB Reactive (A) 11/13/2020      Microbiology:  Recent Results (from the  past 240 hour(s))  Resp Panel by RT-PCR (Flu A&B, Covid) Nasopharyngeal Swab     Status: None   Collection Time: 12/12/2020 11:08 PM   Specimen: Nasopharyngeal Swab; Nasopharyngeal(NP) swabs in vial transport medium  Result Value Ref Range Status   SARS Coronavirus 2 by RT PCR NEGATIVE NEGATIVE Final    Comment: (NOTE) SARS-CoV-2 target nucleic acids are NOT DETECTED.  The SARS-CoV-2 RNA is generally detectable in upper respiratory specimens during the acute phase of infection. The lowest concentration of SARS-CoV-2 viral copies this assay can detect is 138 copies/mL. A negative result does not preclude SARS-Cov-2 infection and should not be used as the sole basis for treatment or other patient management decisions. A negative result may occur with  improper specimen collection/handling, submission of specimen other than nasopharyngeal swab, presence of viral mutation(s) within the areas targeted by this assay, and inadequate number of viral copies(<138 copies/mL). A negative result must be combined with clinical observations, patient history, and epidemiological information. The expected result is Negative.  Fact Sheet for Patients:  EntrepreneurPulse.com.au  Fact Sheet for Healthcare Providers:  IncredibleEmployment.be  This test is no t yet approved or cleared by the [place] FDA and  has been authorized for detection and/or diagnosis of SARS-CoV-2 by FDA under an Emergency Use  Authorization (EUA). This EUA will remain  in effect (meaning this test can be used) for the duration of the COVID-19 declaration under Section 564(b)(1) of the Act, 21 U.S.C.section 360bbb-3(b)(1), unless the authorization is terminated  or revoked sooner.       Influenza A by PCR NEGATIVE NEGATIVE Final   Influenza B by PCR NEGATIVE NEGATIVE Final    Comment: (NOTE) The Xpert Xpress SARS-CoV-2/FLU/RSV plus assay is intended as an aid in the diagnosis of  influenza from Nasopharyngeal swab specimens and should not be used as a sole basis for treatment. Nasal washings and aspirates are unacceptable for Xpert Xpress SARS-CoV-2/FLU/RSV testing.  Fact Sheet for Patients: EntrepreneurPulse.com.au  Fact Sheet for Healthcare Providers: IncredibleEmployment.be  This test is not yet approved or cleared by the [place] FDA and has been authorized for detection and/or diagnosis of SARS-CoV-2 by FDA under an Emergency Use Authorization (EUA). This EUA will remain in effect (meaning this test can be used) for the duration of the COVID-19 declaration under Section 564(b)(1) of the Act, 21 U.S.C. section 360bbb-3(b)(1), unless the authorization is terminated or revoked.  Performed at [facility], [address] [place], [place] [address]     Coagulation Studies: Recent Labs    12/20/2020 2308  LABPROT 17.6*  INR 1.5*    Urinalysis: No results for input(s): COLORURINE, LABSPEC, PHURINE, GLUCOSEU, HGBUR, BILIRUBINUR, KETONESUR, PROTEINUR, UROBILINOGEN, NITRITE, LEUKOCYTESUR in the last 72 hours.  Invalid input(s): APPERANCEUR    Imaging: DG Chest Port 1 View  Result Date: 12/08/2020 CLINICAL DATA:  Possible sepsis EXAM: PORTABLE CHEST 1 VIEW COMPARISON:  12/07/2020 FINDINGS: Cardiac shadow is mildly enlarged but stable. Aortic calcifications are noted. Postsurgical changes are seen. Lungs are well aerated bilaterally. Skin fold is noted over the left chest. Vascular congestion is noted without interstitial edema. Filtrate is seen. IMPRESSION: Increasing vascular congestion without interstitial edema. Electronically Signed   By: Inez Catalina M.D.   On: 01/01/2021 23:13     Medications:    sodium chloride Stopped (12/14/2020 2349)   sodium chloride 100 mL/hr at 12/29/20 0522   pantoprazole 8 mg/hr (12/29/20 0230)    atorvastatin  80 mg Oral Daily   calcium acetate  1,334 mg Oral TID WC    escitalopram  20 mg Oral Daily   loratadine  10 mg Oral Daily   multivitamin  1 tablet Oral QHS   [START ON 01/01/2021] pantoprazole  40 mg Intravenous Q12H   rOPINIRole  1.5 mg Oral QHS   tamsulosin  0.8 mg Oral Daily   acetaminophen **OR** acetaminophen, fluticasone, melatonin, ondansetron **OR** ondansetron (ZOFRAN) IV  Assessment/ Plan:  85 y.o. male with   end stage renal disease on hemodialysis, hypotension, coronary artery disease status post CABG, hyperlipidemia, BPH, congestive heart failure, COPD, AAA status post stent, peptic ulcer disease was admitted on 12/13/2020 for  Principal Problem:   GI bleeding  GI bleeding [K92.2]  [facility] TTS [facility] [address] Left AVF 72kg  #. ESRD Plan for hemodialysis tomorrow morning to keep him on schedule Minimal fluid removal as patient appears dry  #. Anemia of CKD and GI blood loss.  Lab Results  Component Value Date   HGB 7.7 (L) 12/29/2020   Low dose EPO with HD Blood transfusion as per hospitalist team  #. Secondary hyperparathyroidism of renal origin N 25.81   No results found for: PTH Lab Results  Component Value Date   PHOS 3.9 10/01/2017   Monitor calcium and phos level  during this admission  # Hypokalemia Likely nutritional Will use 4 K bath Lab Results  Component Value Date   K 3.3 (L) 12/29/2020       LOS: 0 Odilia Damico 11/23/20229:15 AM  [facility] [place], [place]

## 2020-12-29 NOTE — Hospital Course (Signed)
11/22 presented with generalized weakness.  Found to have symptomatic anemia and dehydration. 11/23 nephrology consulted for ESRD.  GI consulted for concern for GI bleed.  C. difficile test ordered.

## 2020-12-29 NOTE — Assessment & Plan Note (Addendum)
C. difficile ordered due to reports of diarrhea on admission. No BM in initial 24 hours.  The test was discontinued. Patient continues to have loose BM and started to have diarrhea again on 24th. ID consult for further assistance. X-ray abdomen negative for any acute abnormality. Less likely The patient is suffering from acute C. difficile colitis although ongoing diarrhea is concerning. CT angiogram shows no evidence of intra-abdominal pathology. May require GI consultation again if continues to have anemia with diarrhea.

## 2020-12-29 NOTE — Assessment & Plan Note (Signed)
Management per nephrology.

## 2020-12-29 NOTE — Assessment & Plan Note (Addendum)
Prior to admission, recent hemoglobin around 9. On admission hemoglobin around 7.   After 1 PRBC hemoglobin improved up to 8.   No further bleeding reported by RN. GI recommend no intervention.  Although given patient's ongoing positive Hemoccult patient will benefit from outpatient GI referral at least. Continue PPI twice daily.  Transition to p.o.

## 2020-12-29 NOTE — Assessment & Plan Note (Deleted)
Recent hemoglobin around 9. Currently hemoglobin around 7.  After 1 PRBC hemoglobin improved up to 8.  No further bleeding reported by RN. GI recommend no intervention.

## 2020-12-29 NOTE — Assessment & Plan Note (Signed)
Antiplatelet medications are on hold.

## 2020-12-30 ENCOUNTER — Inpatient Hospital Stay: Payer: Medicare Other

## 2020-12-30 DIAGNOSIS — K921 Melena: Secondary | ICD-10-CM | POA: Diagnosis not present

## 2020-12-30 LAB — CBC WITH DIFFERENTIAL/PLATELET
Abs Immature Granulocytes: 0.02 10*3/uL (ref 0.00–0.07)
Basophils Absolute: 0.1 10*3/uL (ref 0.0–0.1)
Basophils Relative: 2 %
Eosinophils Absolute: 0.1 10*3/uL (ref 0.0–0.5)
Eosinophils Relative: 2 %
HCT: 25 % — ABNORMAL LOW (ref 39.0–52.0)
Hemoglobin: 8.2 g/dL — ABNORMAL LOW (ref 13.0–17.0)
Immature Granulocytes: 1 %
Lymphocytes Relative: 18 %
Lymphs Abs: 0.6 10*3/uL — ABNORMAL LOW (ref 0.7–4.0)
MCH: 31.4 pg (ref 26.0–34.0)
MCHC: 32.8 g/dL (ref 30.0–36.0)
MCV: 95.8 fL (ref 80.0–100.0)
Monocytes Absolute: 0.4 10*3/uL (ref 0.1–1.0)
Monocytes Relative: 11 %
Neutro Abs: 2.3 10*3/uL (ref 1.7–7.7)
Neutrophils Relative %: 66 %
Platelets: 52 10*3/uL — ABNORMAL LOW (ref 150–400)
RBC: 2.61 MIL/uL — ABNORMAL LOW (ref 4.22–5.81)
RDW: 20.4 % — ABNORMAL HIGH (ref 11.5–15.5)
WBC: 3.4 10*3/uL — ABNORMAL LOW (ref 4.0–10.5)
nRBC: 0 % (ref 0.0–0.2)

## 2020-12-30 LAB — RENAL FUNCTION PANEL
Albumin: 2.1 g/dL — ABNORMAL LOW (ref 3.5–5.0)
Anion gap: 6 (ref 5–15)
BUN: 31 mg/dL — ABNORMAL HIGH (ref 8–23)
CO2: 32 mmol/L (ref 22–32)
Calcium: 7.7 mg/dL — ABNORMAL LOW (ref 8.9–10.3)
Chloride: 100 mmol/L (ref 98–111)
Creatinine, Ser: 4.55 mg/dL — ABNORMAL HIGH (ref 0.61–1.24)
GFR, Estimated: 12 mL/min — ABNORMAL LOW (ref >60)
Glucose, Bld: 77 mg/dL (ref 70–99)
Phosphorus: 2.8 mg/dL (ref 2.5–4.6)
Potassium: 3.5 mmol/L (ref 3.5–5.1)
Sodium: 138 mmol/L (ref 135–145)

## 2020-12-30 MED ORDER — PANTOPRAZOLE SODIUM 40 MG IV SOLR
40.0000 mg | Freq: Two times a day (BID) | INTRAVENOUS | Status: DC
  Administered 2020-12-30 – 2021-01-03 (×9): 40 mg via INTRAVENOUS
  Filled 2020-12-30 (×9): qty 40

## 2020-12-30 MED ORDER — SODIUM CHLORIDE 0.9 % IV BOLUS
250.0000 mL | Freq: Once | INTRAVENOUS | Status: AC
  Administered 2020-12-30: 250 mL via INTRAVENOUS

## 2020-12-30 MED ORDER — SACCHAROMYCES BOULARDII 250 MG PO CAPS
250.0000 mg | ORAL_CAPSULE | Freq: Two times a day (BID) | ORAL | Status: DC
  Administered 2020-12-30 – 2020-12-31 (×3): 250 mg via ORAL
  Filled 2020-12-30 (×5): qty 1

## 2020-12-30 MED ORDER — SODIUM CHLORIDE 0.9 % IV BOLUS: 250.0000 mL | Freq: Once | INTRAVENOUS | Status: DC

## 2020-12-30 NOTE — Progress Notes (Signed)
195mL ns bolus administered for systolic less than 400. Pt denies cramping, dizziness.

## 2020-12-30 NOTE — Progress Notes (Signed)
At 10:46 as I was placing pt on machine, conductivity on machine jumped to 15.6, not able to bring conductivity down.  Pt was rinsed back due to blood in tubing. Will need to restring a different machine and complete safety checks on another machine, will delay placing pt on machine. No adverse effects noted to pt.

## 2020-12-30 NOTE — Progress Notes (Signed)
Turned blood flow down to see if pressure slightly improves.

## 2020-12-30 NOTE — Progress Notes (Signed)
[facility] Progress Note  Patient: Brent Glover    WCH:[id number]  DOA: 12/21/2020    Date of Service: the patient was seen and examined on 12/30/2020  Brief hospital course: 11/22 presented with generalized weakness.  Found to have symptomatic anemia and dehydration. 11/23 nephrology consulted for ESRD.  GI consulted for concern for GI bleed.  C. difficile test ordered.  Assessment and Plan: * GI bleeding Recent hemoglobin around 9. Currently hemoglobin around 7.  After 1 PRBC hemoglobin improved up to 8.  No further bleeding reported by RN. GI recommend no intervention.  Hypotension Presents with generalized weakness.  Blood pressure was 80/30 in the ER.  Hemoglobin dropped from 9.4-7.7.  Hemoccult stool positive. Holding antihypertensive medication.  Gentle IV hydration initiated.  Monitor for now.  Midodrine. Blood pressure now better.  Hydration on hold..  Anemia in ESRD (end-stage renal disease) ([name]) Management per nephrology.  CAD (coronary artery disease) Antiplatelet medications are on hold.  Chronic combined systolic and diastolic heart failure ([name]) Volume status appear stable.  Monitor.  Holding all antihypertensive as well as diuretic medication.  ESRD (end stage renal disease) [name]) Appreciate nephrology assistance. HD per nephrology.  H/O Clostridium difficile infection C. difficile ordered due to reports of diarrhea.  Monitor.  Although suspect that the patient is actually overusing his stool softener which is causing diarrhea. No sample available in 24 hours of ordering the test.  Thrombocytopenia ([name]) Acute on chronic. Chronically low around 100.  On presentation platelet is 54.  Last platelet check was 67.  Monitor.  Type II diabetes mellitus with renal manifestations ([name]) Continue sliding scale insulin.    Body mass index is 25.9 kg/m.        Subjective: No nausea no vomiting.  No fever no chills.  Report 1 episode of loose BM early  in the morning without any blood.  Objective: Vital signs were reviewed and unremarkable.  Exam: General: Appear in mild distress, no Rash; Oral Mucosa Clear, moist. no Abnormal Neck Mass Or lumps, Conjunctiva normal  Cardiovascular: S1 and S2 Present, no Murmur, Respiratory: good respiratory effort, Bilateral Air entry present and CTA, no Crackles, no wheezes Abdomen: Bowel Sound present, Soft and no tenderness Extremities: no Pedal edema Neurology: alert and oriented to time, place, and person affect appropriate. no new focal deficit Gait not checked due to patient safety concerns    Data Reviewed: My review of labs, imaging, notes and other tests is significant for     improvement in hemoglobin.  Disposition:  Status is: Inpatient  Remains inpatient appropriate because: Ongoing work-up for hypotension   Family Communication: No family at bedside.  DVT Prophylaxis: SCDs Start: 12/29/20 0045   Time spent: 35 minutes.   Author: Berle Mull  12/30/2020 7:57 PM  To reach On-call, see care teams to locate the attending and reach out via [email or web address]. Between 7PM-7AM, please contact night-coverage If you still have difficulty reaching the attending provider, please page the [facility] (Director on Call) for [facility] on amion for assistance.

## 2020-12-30 NOTE — Progress Notes (Signed)
[facility] [place], [place] 12/30/20  Subjective:   LOS: 1  Patient known to our practice from previous admissions. This time he is admitted for generalized weakness, loose stools which are dark in color.  He denies nausea vomiting or fever.  No cough or shortness of breath. Currently on room air when seen. Scheduled for hemodialysis today  Objective:  Vital signs in last 24 hours:  Temp:  [97.4 F (36.3 C)-98.3 F (36.8 C)] 98 F (36.7 C) (11/24 1036) Pulse Rate:  [56-62] 58 (11/24 1215) Resp:  [16-26] 18 (11/24 1215) BP: (93-116)/(48-59) 116/53 (11/24 1215) SpO2:  [93 %-98 %] 98 % (11/24 1130) Weight:  [74 kg] 74 kg (11/24 1036)  Weight change:  Filed Weights   12/24/2020 2300 12/30/20 1036  Weight: 72.1 kg 74 kg    Intake/Output:    Intake/Output Summary (Last 24 hours) at 12/30/2020 1221 Last data filed at 12/30/2020 0400 Gross per 24 hour  Intake 544.98 ml  Output --  Net 544.98 ml      Physical Exam: General: Frail, elderly gentleman, laying in the bed  HEENT Anicteric, mouth is dry  Pulm/lungs Normal breathing effort  CVS/Heart No rub or gallop  Abdomen:   soft, nontender  Extremities: No edema  Neurologic: Alert, oriented  Skin: No acute rashes  Access: Left forearm AV fistula       Basic Metabolic Panel:  Recent Labs  Lab 12/08/2020 2308 12/29/20 0523 12/30/20 0611  NA 135 137 138  K 3.0* 3.3* 3.5  CL 95* 97* 100  CO2 33* 32 32  GLUCOSE 68* 83 77  BUN 22 24* 31*  CREATININE 2.89* 3.37* 4.55*  CALCIUM 7.5* 7.5* 7.7*  PHOS  --   --  2.8      CBC: Recent Labs  Lab 12/19/2020 2308 12/29/20 0523 12/29/20 1103 12/29/20 1648 12/29/20 2202 12/30/20 0611  WBC 4.2 4.0  --   --   --  3.4*  NEUTROABS 2.9  --   --   --   --  2.3  HGB 7.7* 7.7* 7.9* 7.0* 8.0* 8.2*  HCT 24.0* 23.5* 23.9* 21.8* 24.2* 25.0*  MCV 100.8* 95.5  --   --   --  95.8  PLT 54* 50*  --   --   --  52*       Lab Results  Component Value Date    HEPBSAG NON REACTIVE 12/29/2020   HEPBSAB Reactive (A) 11/13/2020      Microbiology:  Recent Results (from the past 240 hour(s))  Resp Panel by RT-PCR (Flu A&B, Covid) Nasopharyngeal Swab     Status: None   Collection Time: 12/20/2020 11:08 PM   Specimen: Nasopharyngeal Swab; Nasopharyngeal(NP) swabs in vial transport medium  Result Value Ref Range Status   SARS Coronavirus 2 by RT PCR NEGATIVE NEGATIVE Final    Comment: (NOTE) SARS-CoV-2 target nucleic acids are NOT DETECTED.  The SARS-CoV-2 RNA is generally detectable in upper respiratory specimens during the acute phase of infection. The lowest concentration of SARS-CoV-2 viral copies this assay can detect is 138 copies/mL. A negative result does not preclude SARS-Cov-2 infection and should not be used as the sole basis for treatment or other patient management decisions. A negative result may occur with  improper specimen collection/handling, submission of specimen other than nasopharyngeal swab, presence of viral mutation(s) within the areas targeted by this assay, and inadequate number of viral copies(<138 copies/mL). A negative result must be combined with clinical observations, patient history,  and epidemiological information. The expected result is Negative.  Fact Sheet for Patients:  EntrepreneurPulse.com.au  Fact Sheet for Healthcare Providers:  IncredibleEmployment.be  This test is no t yet approved or cleared by the [place] FDA and  has been authorized for detection and/or diagnosis of SARS-CoV-2 by FDA under an Emergency Use Authorization (EUA). This EUA will remain  in effect (meaning this test can be used) for the duration of the COVID-19 declaration under Section 564(b)(1) of the Act, 21 U.S.C.section 360bbb-3(b)(1), unless the authorization is terminated  or revoked sooner.       Influenza A by PCR NEGATIVE NEGATIVE Final   Influenza B by PCR NEGATIVE NEGATIVE  Final    Comment: (NOTE) The Xpert Xpress SARS-CoV-2/FLU/RSV plus assay is intended as an aid in the diagnosis of influenza from Nasopharyngeal swab specimens and should not be used as a sole basis for treatment. Nasal washings and aspirates are unacceptable for Xpert Xpress SARS-CoV-2/FLU/RSV testing.  Fact Sheet for Patients: EntrepreneurPulse.com.au  Fact Sheet for Healthcare Providers: IncredibleEmployment.be  This test is not yet approved or cleared by the [place] FDA and has been authorized for detection and/or diagnosis of SARS-CoV-2 by FDA under an Emergency Use Authorization (EUA). This EUA will remain in effect (meaning this test can be used) for the duration of the COVID-19 declaration under Section 564(b)(1) of the Act, 21 U.S.C. section 360bbb-3(b)(1), unless the authorization is terminated or revoked.  Performed at [facility], [address] [place], [place] [address]   Blood Culture (routine x 2)     Status: None (Preliminary result)   Collection Time: 12/20/2020 11:09 PM   Specimen: BLOOD LEFT FOREARM  Result Value Ref Range Status   Specimen Description BLOOD LEFT FOREARM  Final   Special Requests   Final    BOTTLES DRAWN AEROBIC AND ANAEROBIC Blood Culture adequate volume   Culture   Final    NO GROWTH 2 DAYS Performed at [facility], [address] [place], [place] [address]    Report Status PENDING  Incomplete  Blood Culture (routine x 2)     Status: None (Preliminary result)   Collection Time: 12/16/2020 11:09 PM   Specimen: BLOOD RIGHT FOREARM  Result Value Ref Range Status   Specimen Description BLOOD RIGHT FOREARM  Final   Special Requests   Final    BOTTLES DRAWN AEROBIC AND ANAEROBIC Blood Culture results may not be optimal due to an excessive volume of blood received in culture bottles   Culture   Final    NO GROWTH 2 DAYS Performed at [facility], [address]  [place], [place] [address]    Report Status PENDING  Incomplete    Coagulation Studies: Recent Labs    12/12/2020 2308  LABPROT 17.6*  INR 1.5*     Urinalysis: No results for input(s): COLORURINE, LABSPEC, PHURINE, GLUCOSEU, HGBUR, BILIRUBINUR, KETONESUR, PROTEINUR, UROBILINOGEN, NITRITE, LEUKOCYTESUR in the last 72 hours.  Invalid input(s): APPERANCEUR    Imaging: DG Chest Port 1 View  Result Date: 12/08/2020 CLINICAL DATA:  Possible sepsis EXAM: PORTABLE CHEST 1 VIEW COMPARISON:  12/07/2020 FINDINGS: Cardiac shadow is mildly enlarged but stable. Aortic calcifications are noted. Postsurgical changes are seen. Lungs are well aerated bilaterally. Skin fold is noted over the left chest. Vascular congestion is noted without interstitial edema. Filtrate is seen. IMPRESSION: Increasing vascular congestion without interstitial edema. Electronically Signed   By: Inez Catalina M.D.   On: 12/29/2020 23:13  Medications:      atorvastatin  80 mg Oral Daily   calcium acetate  1,334 mg Oral TID WC   Chlorhexidine Gluconate Cloth  6 each Topical Q0600   epoetin (EPOGEN/PROCRIT) injection  4,000 Units Intravenous Q T,Th,Sa-HD   escitalopram  20 mg Oral Daily   loratadine  10 mg Oral Daily   midodrine  5 mg Oral TID WC   multivitamin  1 tablet Oral QHS   pantoprazole (PROTONIX) IV  40 mg Intravenous Q12H   rOPINIRole  1.5 mg Oral QHS   tamsulosin  0.8 mg Oral Daily   acetaminophen **OR** acetaminophen, fluticasone, melatonin, ondansetron **OR** ondansetron (ZOFRAN) IV  Assessment/ Plan:  85 y.o. male with   end stage renal disease on hemodialysis, hypotension, coronary artery disease status post CABG, hyperlipidemia, BPH, congestive heart failure, COPD, AAA status post stent, peptic ulcer disease was admitted on 01/02/2021 for  Principal Problem:   GI bleeding Active Problems:   Chronic combined systolic and diastolic heart failure ([name])   Acute on chronic anemia   Type II diabetes  mellitus with renal manifestations ([name])   ESRD (end stage renal disease) ([name])   Anemia in ESRD (end-stage renal disease) ([name])   CAD (coronary artery disease)   Hypotension   Thrombocytopenia ([place])   H/O Clostridium difficile infection  GI bleeding [K92.2]  [facility] TTS [facility] [address] Left AVF 72kg  #. ESRD Plan for hemodialysis today Patient appears volume depleted therefore will require small boluses during hemodialysis to maintain his blood pressure.  #. Anemia of CKD and GI blood loss.  Lab Results  Component Value Date   HGB 8.2 (L) 12/30/2020   Low dose EPO with HD Blood transfusion as per hospitalist team  #. Secondary hyperparathyroidism of renal origin N 25.81   No results found for: PTH Lab Results  Component Value Date   PHOS 2.8 12/30/2020   Monitor calcium and phos level during this admission  # Hypokalemia Likely nutritional Will use 4 K bath Lab Results  Component Value Date   K 3.5 12/30/2020       LOS: 1 Brent Glover 11/24/202212:21 PM  [employer] [place], [place]

## 2020-12-30 NOTE — Progress Notes (Signed)
UF turned off.

## 2020-12-31 ENCOUNTER — Inpatient Hospital Stay: Payer: Medicare Other

## 2020-12-31 ENCOUNTER — Encounter: Payer: Self-pay | Admitting: Family Medicine

## 2020-12-31 DIAGNOSIS — K921 Melena: Secondary | ICD-10-CM | POA: Diagnosis not present

## 2020-12-31 DIAGNOSIS — I779 Disorder of arteries and arterioles, unspecified: Secondary | ICD-10-CM | POA: Diagnosis present

## 2020-12-31 DIAGNOSIS — Z221 Carrier of other intestinal infectious diseases: Secondary | ICD-10-CM

## 2020-12-31 LAB — CBC WITH DIFFERENTIAL/PLATELET
Abs Immature Granulocytes: 0.01 10*3/uL (ref 0.00–0.07)
Basophils Absolute: 0 10*3/uL (ref 0.0–0.1)
Basophils Relative: 1 %
Eosinophils Absolute: 0.1 10*3/uL (ref 0.0–0.5)
Eosinophils Relative: 3 %
HCT: 24.3 % — ABNORMAL LOW (ref 39.0–52.0)
Hemoglobin: 7.9 g/dL — ABNORMAL LOW (ref 13.0–17.0)
Immature Granulocytes: 0 %
Lymphocytes Relative: 11 %
Lymphs Abs: 0.4 10*3/uL — ABNORMAL LOW (ref 0.7–4.0)
MCH: 31.5 pg (ref 26.0–34.0)
MCHC: 32.5 g/dL (ref 30.0–36.0)
MCV: 96.8 fL (ref 80.0–100.0)
Monocytes Absolute: 0.3 10*3/uL (ref 0.1–1.0)
Monocytes Relative: 8 %
Neutro Abs: 2.7 10*3/uL (ref 1.7–7.7)
Neutrophils Relative %: 77 %
Platelets: 55 10*3/uL — ABNORMAL LOW (ref 150–400)
RBC: 2.51 MIL/uL — ABNORMAL LOW (ref 4.22–5.81)
RDW: 20.1 % — ABNORMAL HIGH (ref 11.5–15.5)
WBC: 3.6 10*3/uL — ABNORMAL LOW (ref 4.0–10.5)
nRBC: 0 % (ref 0.0–0.2)

## 2020-12-31 LAB — RENAL FUNCTION PANEL
Albumin: 1.9 g/dL — ABNORMAL LOW (ref 3.5–5.0)
Anion gap: 5 (ref 5–15)
BUN: 17 mg/dL (ref 8–23)
CO2: 32 mmol/L (ref 22–32)
Calcium: 7.7 mg/dL — ABNORMAL LOW (ref 8.9–10.3)
Chloride: 97 mmol/L — ABNORMAL LOW (ref 98–111)
Creatinine, Ser: 3.44 mg/dL — ABNORMAL HIGH (ref 0.61–1.24)
GFR, Estimated: 17 mL/min — ABNORMAL LOW (ref >60)
Glucose, Bld: 105 mg/dL — ABNORMAL HIGH (ref 70–99)
Phosphorus: 1.7 mg/dL — ABNORMAL LOW (ref 2.5–4.6)
Potassium: 3.7 mmol/L (ref 3.5–5.1)
Sodium: 134 mmol/L — ABNORMAL LOW (ref 135–145)

## 2020-12-31 LAB — TYPE AND SCREEN
ABO/RH(D): O POS
Antibody Screen: NEGATIVE
Unit division: 0
Unit division: 0

## 2020-12-31 LAB — BPAM RBC
Blood Product Expiration Date: 202212272359
Blood Product Expiration Date: 202212272359
ISSUE DATE / TIME: 202211230215
Unit Type and Rh: 5100
Unit Type and Rh: 5100

## 2020-12-31 LAB — PREPARE RBC (CROSSMATCH)

## 2020-12-31 MED ORDER — IOHEXOL 350 MG/ML SOLN
100.0000 mL | Freq: Once | INTRAVENOUS | Status: AC | PRN
  Administered 2020-12-31: 100 mL via INTRAVENOUS

## 2020-12-31 NOTE — TOC CM/SW Note (Signed)
Spoke to [place] with [facility] who confirms they are still active with patient for [facility], PT, and OT.  Will need new orders. Notified MD.  Oleh Genin, LCSW [phone]

## 2020-12-31 NOTE — Care Management Important Message (Signed)
Important Message  Patient Details  Name: Brent Glover MRN: [id number] Date of Birth: August 07, 1935   Medicare Important Message Given:  Yes     Dannette Barbara 12/31/2020, 2:03 PM

## 2020-12-31 NOTE — Consult Note (Signed)
NAME: Brent Glover  DOB: 23-Apr-1935  MRN: [id number]  Date/Time: 12/31/2020 4:05 PM  REQUESTING PROVIDER: Dr.PAtel Subjective:  REASON FOR CONSULT: Cdiff  ? Brent Glover is a 85 y.o. male with a history of CABG, Aortic aneursym s/p stent, ESRD on dialysis, HTN, Smoker presents with weakness and frequent falls As per patient he has had black tarry stools intermittently since october- He had dental work done in October for which he was given amoxicillin. He came to the ED initally on 11/11/20 with weakness, BP of 70s and black tarry stool. His Hb was 7.1, he was transfused PRBC- - GI saw him and asked for cdiff test stool test revealed cdiff positive antigen and PCR but negative toxin. He was given vanco PO and no further intervention was deemed necessary.pt was discharged home on 10/10. He continued to have falls and also continued to have black tarry stools. He returned to ED on 11/1 after a fall and CT head showed non displaced nasal bone fracture and rt scalp hematoma.  Marland Kitchen He returned on 11/22 with generalized weakness and black stool > He was at dialysis and could not walk and was brought to the ED Vitals BP 91/38, temp 97.3, pulse 79, labs wbc 4.2/Hb 7.7, PLT 54, cr 2.89 GI saw him on 11/23 and asked for cdiff and to transfuse blood and consult palliative. I am asked to see the patient for cdiff Pt has no abdominal pain, has occasional cramping- last night had 5 stools with black color  Past Medical History:  Diagnosis Date   BPH (benign prostatic hyperplasia)    Chronic diastolic heart failure ([name])    COPD (chronic obstructive pulmonary disease) ([name])    Diabetes mellitus without complication ([name])    ESRD (end stage renal disease) ([place])    stage III   Hyperlipidemia    Hypertension     Past Surgical History:  Procedure Laterality Date   ABDOMINAL AORTIC ENDOVASCULAR STENT GRAFT     CYSTOSCOPY     KNEE ARTHROPLASTY      Social History   Socioeconomic History   Marital  status: Married    Spouse name: Not on file   Number of children: Not on file   Years of education: Not on file   Highest education level: Not on file  Occupational History   Not on file  Tobacco Use   Smoking status: Every Day    Packs/day: 1.00    Years: 60.00    Pack years: 60.00    Types: Cigarettes    Last attempt to quit: 10/11/2014    Years since quitting: 6.2   Smokeless tobacco: Never  Substance and Sexual Activity   Alcohol use: No   Drug use: No   Sexual activity: Not on file  Other Topics Concern   Not on file  Social History Narrative   Not on file   Social Determinants of Health   Financial Resource Strain: Not on file  Food Insecurity: Not on file  Transportation Needs: Not on file  Physical Activity: Not on file  Stress: Not on file  Social Connections: Not on file  Intimate Partner Violence: Not on file    Family History  Problem Relation Age of Onset   Diabetes Mellitus II Sister    Diabetes type II Brother    Allergies  Allergen Reactions   Montelukast Shortness Of Breath   Trazodone Other (See Comments)    "it hypes me up instead of putting me to  sleep"   Hydrocodone Nausea And Vomiting   I? Current Facility-Administered Medications  Medication Dose Route Frequency Provider Last Rate Last Admin   acetaminophen (TYLENOL) tablet 650 mg  650 mg Oral Q6H PRN Mansy, Jan A, MD   650 mg at 12/30/20 2142   Or   acetaminophen (TYLENOL) suppository 650 mg  650 mg Rectal Q6H PRN Mansy, Jan A, MD       atorvastatin (LIPITOR) tablet 80 mg  80 mg Oral Daily Mansy, Jan A, MD   80 mg at 12/31/20 8756   Chlorhexidine Gluconate Cloth 2 % PADS 6 each  6 each Topical Q0600 Murlean Iba, MD   6 each at 12/31/20 0629   epoetin alfa (EPOGEN) injection 4,000 Units  4,000 Units Intravenous Q T,Th,Sa-HD Murlean Iba, MD   4,000 Units at 12/30/20 1408   escitalopram (LEXAPRO) tablet 20 mg  20 mg Oral Daily Mansy, Jan A, MD   20 mg at 12/31/20 0825   fluticasone  (FLONASE) 50 MCG/ACT nasal spray 2 spray  2 spray Each Nare Daily PRN Mansy, Jan A, MD       loratadine (CLARITIN) tablet 10 mg  10 mg Oral Daily Mansy, Jan A, MD   10 mg at 12/31/20 0825   melatonin tablet 10 mg  10 mg Oral QHS PRN Mansy, Jan A, MD       midodrine (PROAMATINE) tablet 5 mg  5 mg Oral TID WC Murlean Iba, MD   5 mg at 12/31/20 1233   multivitamin (RENA-VIT) tablet 1 tablet  1 tablet Oral QHS Mansy, Jan A, MD   1 tablet at 12/30/20 2143   ondansetron (ZOFRAN) tablet 4 mg  4 mg Oral Q6H PRN Mansy, Jan A, MD       Or   ondansetron [facility]) injection 4 mg  4 mg Intravenous Q6H PRN Mansy, Jan A, MD       pantoprazole (PROTONIX) injection 40 mg  40 mg Intravenous Q12H Lavina Hamman, MD   40 mg at 12/31/20 0825   rOPINIRole (REQUIP) tablet 1.5 mg  1.5 mg Oral QHS Mansy, Jan A, MD   1.5 mg at 12/30/20 2142   saccharomyces boulardii (FLORASTOR) capsule 250 mg  250 mg Oral BID Lavina Hamman, MD   250 mg at 12/31/20 4332   tamsulosin (FLOMAX) capsule 0.8 mg  0.8 mg Oral Daily Mansy, Jan A, MD   0.8 mg at 12/31/20 9518     Abtx:  Anti-infectives (From admission, onward)    None       REVIEW OF SYSTEMS:  Const: negative fever, negative chills, weight loss of 50 pounds since going on dialysis Eyes: negative diplopia or visual changes, negative eye pain ENT: negative coryza, negative sore throat Resp: negative cough, hemoptysis, has dyspnea Cards: negative for chest pain, palpitations, lower extremity edema GI: no post prandial pain, has black tarry stool Skin: negative for rash and pruritus Heme: negative for easy bruising and gum/nose bleeding MS: generalized weakness Knee pain Neurolo: dizziness, falls Psych: negative for feelings of anxiety, depression  Endocrine: negative for thyroid, diabetes Allergy/Immunology- as above Objective:  VITALS:  BP (!) 89/45 (BP Location: Right Arm)   Pulse 65   Temp 98 F (36.7 C)   Resp 18   Ht 5\' 7"  (1.702 m)   Wt 75 kg    SpO2 93%   BMI 25.90 kg/m  PHYSICAL EXAM:  General: Alert, cooperative, no distress, weak Head: Normocephalic, without obvious abnormality, atraumatic. Ecchymosis below  rt eye and left  And nostril Eyes: Conjunctivae clear, anicteric sclerae. Pupils are equal ENT Nares normal. No drainage or sinus tenderness. Lips, mucosa, and tongue normal. No Thrush Neck: Supple, symmetrical, no adenopathy, thyroid: non tender no carotid bruit and no JVD. Back: No CVA tenderness. Lungs: b/l air entry- rhonchi Heart: s1s2- sternal scar Abdomen: Soft, non-tender,not distended. Bowel sounds normal. No masses Extremities: atraumatic, no cyanosis. No edema. No clubbing Skin: No rashes or lesions. Or bruising Lymph: Cervical, supraclavicular normal. Neurologic: Grossly non-focal Pertinent Labs Lab Results CBC    Component Value Date/Time   WBC 3.6 (L) 12/31/2020 0927   RBC 2.51 (L) 12/31/2020 0927   HGB 7.9 (L) 12/31/2020 0927   HGB 9.7 (L) 06/17/2012 0352   HCT 24.3 (L) 12/31/2020 0927   HCT 29.8 (L) 06/17/2012 0352   PLT 55 (L) 12/31/2020 0927   PLT 187 06/17/2012 0352   MCV 96.8 12/31/2020 0927   MCV 73 (L) 06/17/2012 0352   MCH 31.5 12/31/2020 0927   MCHC 32.5 12/31/2020 0927   RDW 20.1 (H) 12/31/2020 0927   RDW 17.8 (H) 06/17/2012 0352   LYMPHSABS 0.4 (L) 12/31/2020 0927   LYMPHSABS 1.6 06/17/2012 0352   MONOABS 0.3 12/31/2020 0927   MONOABS 0.8 06/17/2012 0352   EOSABS 0.1 12/31/2020 0927   EOSABS 0.3 06/17/2012 0352   BASOSABS 0.0 12/31/2020 0927   BASOSABS 0.1 06/17/2012 0352    CMP Latest Ref Rng & Units 12/31/2020 12/30/2020 12/29/2020  Glucose 70 - 99 mg/dL 105(H) 77 83  BUN 8 - 23 mg/dL 17 31(H) 24(H)  Creatinine 0.61 - 1.24 mg/dL 3.44(H) 4.55(H) 3.37(H)  Sodium 135 - 145 mmol/L 134(L) 138 137  Potassium 3.5 - 5.1 mmol/L 3.7 3.5 3.3(L)  Chloride 98 - 111 mmol/L 97(L) 100 97(L)  CO2 22 - 32 mmol/L 32 32 32  Calcium 8.9 - 10.3 mg/dL 7.7(L) 7.7(L) 7.5(L)  Total Protein  6.5 - 8.1 g/dL - - -  Total Bilirubin 0.3 - 1.2 mg/dL - - -  Alkaline Phos 38 - 126 U/L - - -  AST 15 - 41 U/L - - -  ALT 0 - 44 U/L - - -      Microbiology: Recent Results (from the past 240 hour(s))  Resp Panel by RT-PCR (Flu A&B, Covid) Nasopharyngeal Swab     Status: None   Collection Time: 12/21/2020 11:08 PM   Specimen: Nasopharyngeal Swab; Nasopharyngeal(NP) swabs in vial transport medium  Result Value Ref Range Status   SARS Coronavirus 2 by RT PCR NEGATIVE NEGATIVE Final    Comment: (NOTE) SARS-CoV-2 target nucleic acids are NOT DETECTED.  The SARS-CoV-2 RNA is generally detectable in upper respiratory specimens during the acute phase of infection. The lowest concentration of SARS-CoV-2 viral copies this assay can detect is 138 copies/mL. A negative result does not preclude SARS-Cov-2 infection and should not be used as the sole basis for treatment or other patient management decisions. A negative result may occur with  improper specimen collection/handling, submission of specimen other than nasopharyngeal swab, presence of viral mutation(s) within the areas targeted by this assay, and inadequate number of viral copies(<138 copies/mL). A negative result must be combined with clinical observations, patient history, and epidemiological information. The expected result is Negative.  Fact Sheet for Patients:  EntrepreneurPulse.com.au  Fact Sheet for Healthcare Providers:  IncredibleEmployment.be  This test is no t yet approved or cleared by the [place] FDA and  has been authorized for detection and/or diagnosis of  SARS-CoV-2 by FDA under an Emergency Use Authorization (EUA). This EUA will remain  in effect (meaning this test can be used) for the duration of the COVID-19 declaration under Section 564(b)(1) of the Act, 21 U.S.C.section 360bbb-3(b)(1), unless the authorization is terminated  or revoked sooner.       Influenza A  by PCR NEGATIVE NEGATIVE Final   Influenza B by PCR NEGATIVE NEGATIVE Final    Comment: (NOTE) The Xpert Xpress SARS-CoV-2/FLU/RSV plus assay is intended as an aid in the diagnosis of influenza from Nasopharyngeal swab specimens and should not be used as a sole basis for treatment. Nasal washings and aspirates are unacceptable for Xpert Xpress SARS-CoV-2/FLU/RSV testing.  Fact Sheet for Patients: EntrepreneurPulse.com.au  Fact Sheet for Healthcare Providers: IncredibleEmployment.be  This test is not yet approved or cleared by the [place] FDA and has been authorized for detection and/or diagnosis of SARS-CoV-2 by FDA under an Emergency Use Authorization (EUA). This EUA will remain in effect (meaning this test can be used) for the duration of the COVID-19 declaration under Section 564(b)(1) of the Act, 21 U.S.C. section 360bbb-3(b)(1), unless the authorization is terminated or revoked.  Performed at [facility], [address] [place], [place] [address]   Blood Culture (routine x 2)     Status: None (Preliminary result)   Collection Time: 12/10/2020 11:09 PM   Specimen: BLOOD LEFT FOREARM  Result Value Ref Range Status   Specimen Description BLOOD LEFT FOREARM  Final   Special Requests   Final    BOTTLES DRAWN AEROBIC AND ANAEROBIC Blood Culture adequate volume   Culture   Final    NO GROWTH 3 DAYS Performed at [facility], [address] [place], [place] [address]    Report Status PENDING  Incomplete  Blood Culture (routine x 2)     Status: None (Preliminary result)   Collection Time: 12/18/2020 11:09 PM   Specimen: BLOOD RIGHT FOREARM  Result Value Ref Range Status   Specimen Description BLOOD RIGHT FOREARM  Final   Special Requests   Final    BOTTLES DRAWN AEROBIC AND ANAEROBIC Blood Culture results may not be optimal due to an excessive volume of blood received in culture bottles   Culture   Final    NO  GROWTH 3 DAYS Performed at [facility], [address] [place], [place] [address]    Report Status PENDING  Incomplete    IMAGING RESULTS: Noncontrast study demonstrates an aorto bi-iliac stent graft which excludes an aortic aneurysm. Maximal aneurysm sac diameter is 5.3 cm. I have personally reviewed the films ? Impression/Recommendation ? Pt presenting with weakness, h/o falls and black tarry stool  GI bleed- melena stool- this is ongoing for 6 weeks- Cdiff does not present with GI bleed- Need to r/o ischemic colitis/ mesenteric ischemia- gastritis There is no need to test for Cdiff as even if present is not the cause for his symptoms- HE had toxin neg/antigen positive/PCR positive cdiff test in oct. 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 ?HTN ___________________________________________________ Discussed with patient,and  requesting provider  ID will follow him peripherally this weekend- call if needed Note:  This document was prepared using Dragon voice recognition software and may include unintentional dictation errors.

## 2020-12-31 NOTE — Progress Notes (Signed)
[facility], [place] 12/31/20  Subjective:   LOS: 2  Patient known to our practice from previous admissions. This time he is admitted for generalized weakness, loose stools which are dark in color.    Patient seen resting quietly, alert Tolerating small meals Denies nausea and vomiting   Objective:  Vital signs in last 24 hours:  Temp:  [97.1 F (36.2 C)-98.2 F (36.8 C)] 98 F (36.7 C) (11/25 1152) Pulse Rate:  [57-80] 65 (11/25 1152) Resp:  [18-23] 18 (11/25 1152) BP: (89-113)/(45-64) 89/45 (11/25 1152) SpO2:  [93 %-98 %] 93 % (11/25 1202) Weight:  [75 kg] 75 kg (11/24 1500)  Weight change:  Filed Weights   12/30/2020 2300 12/30/20 1036 12/30/20 1500  Weight: 72.1 kg 74 kg 75 kg    Intake/Output:    Intake/Output Summary (Last 24 hours) at 12/31/2020 1315 Last data filed at 12/31/2020 0945 Gross per 24 hour  Intake 820 ml  Output -1070 ml  Net 1890 ml      Physical Exam: General: Frail, elderly gentleman, laying in the bed  HEENT Anicteric, mouth is dry  Pulm/lungs Normal breathing effort  CVS/Heart No rub or gallop  Abdomen:   soft, nontender  Extremities: No edema  Neurologic: Alert, oriented  Skin: No acute rashes  Access: Left forearm AV fistula       Basic Metabolic Panel:  Recent Labs  Lab 12/23/2020 2308 12/29/20 0523 12/30/20 0611 12/31/20 0927  NA 135 137 138 134*  K 3.0* 3.3* 3.5 3.7  CL 95* 97* 100 97*  CO2 33* 32 32 32  GLUCOSE 68* 83 77 105*  BUN 22 24* 31* 17  CREATININE 2.89* 3.37* 4.55* 3.44*  CALCIUM 7.5* 7.5* 7.7* 7.7*  PHOS  --   --  2.8 1.7*      CBC: Recent Labs  Lab 12/11/2020 2308 12/29/20 0523 12/29/20 1103 12/29/20 1648 12/29/20 2202 12/30/20 0611 12/31/20 0927  WBC 4.2 4.0  --   --   --  3.4* 3.6*  NEUTROABS 2.9  --   --   --   --  2.3 2.7  HGB 7.7* 7.7* 7.9* 7.0* 8.0* 8.2* 7.9*  HCT 24.0* 23.5* 23.9* 21.8* 24.2* 25.0* 24.3*  MCV 100.8* 95.5  --   --   --  95.8 96.8  PLT  54* 50*  --   --   --  52* 55*       Lab Results  Component Value Date   HEPBSAG NON REACTIVE 12/29/2020   HEPBSAB Reactive (A) 11/13/2020      Microbiology:  Recent Results (from the past 240 hour(s))  Resp Panel by RT-PCR (Flu A&B, Covid) Nasopharyngeal Swab     Status: None   Collection Time: 12/14/2020 11:08 PM   Specimen: Nasopharyngeal Swab; Nasopharyngeal(NP) swabs in vial transport medium  Result Value Ref Range Status   SARS Coronavirus 2 by RT PCR NEGATIVE NEGATIVE Final    Comment: (NOTE) SARS-CoV-2 target nucleic acids are NOT DETECTED.  The SARS-CoV-2 RNA is generally detectable in upper respiratory specimens during the acute phase of infection. The lowest concentration of SARS-CoV-2 viral copies this assay can detect is 138 copies/mL. A negative result does not preclude SARS-Cov-2 infection and should not be used as the sole basis for treatment or other patient management decisions. A negative result may occur with  improper specimen collection/handling, submission of specimen other than nasopharyngeal swab, presence of viral mutation(s) within the areas targeted by this assay, and inadequate  number of viral copies(<138 copies/mL). A negative result must be combined with clinical observations, patient history, and epidemiological information. The expected result is Negative.  Fact Sheet for Patients:  EntrepreneurPulse.com.au  Fact Sheet for Healthcare Providers:  IncredibleEmployment.be  This test is no t yet approved or cleared by the [place] FDA and  has been authorized for detection and/or diagnosis of SARS-CoV-2 by FDA under an Emergency Use Authorization (EUA). This EUA will remain  in effect (meaning this test can be used) for the duration of the COVID-19 declaration under Section 564(b)(1) of the Act, 21 U.S.C.section 360bbb-3(b)(1), unless the authorization is terminated  or revoked sooner.        Influenza A by PCR NEGATIVE NEGATIVE Final   Influenza B by PCR NEGATIVE NEGATIVE Final    Comment: (NOTE) The Xpert Xpress SARS-CoV-2/FLU/RSV plus assay is intended as an aid in the diagnosis of influenza from Nasopharyngeal swab specimens and should not be used as a sole basis for treatment. Nasal washings and aspirates are unacceptable for Xpert Xpress SARS-CoV-2/FLU/RSV testing.  Fact Sheet for Patients: EntrepreneurPulse.com.au  Fact Sheet for Healthcare Providers: IncredibleEmployment.be  This test is not yet approved or cleared by the [place] FDA and has been authorized for detection and/or diagnosis of SARS-CoV-2 by FDA under an Emergency Use Authorization (EUA). This EUA will remain in effect (meaning this test can be used) for the duration of the COVID-19 declaration under Section 564(b)(1) of the Act, 21 U.S.C. section 360bbb-3(b)(1), unless the authorization is terminated or revoked.  Performed at [facility], [address] [place], [place] [address]   Blood Culture (routine x 2)     Status: None (Preliminary result)   Collection Time: 12/20/2020 11:09 PM   Specimen: BLOOD LEFT FOREARM  Result Value Ref Range Status   Specimen Description BLOOD LEFT FOREARM  Final   Special Requests   Final    BOTTLES DRAWN AEROBIC AND ANAEROBIC Blood Culture adequate volume   Culture   Final    NO GROWTH 3 DAYS Performed at [facility], [address] [place], [place] [address]    Report Status PENDING  Incomplete  Blood Culture (routine x 2)     Status: None (Preliminary result)   Collection Time: 01/04/2021 11:09 PM   Specimen: BLOOD RIGHT FOREARM  Result Value Ref Range Status   Specimen Description BLOOD RIGHT FOREARM  Final   Special Requests   Final    BOTTLES DRAWN AEROBIC AND ANAEROBIC Blood Culture results may not be optimal due to an excessive volume of blood received in culture bottles   Culture   Final     NO GROWTH 3 DAYS Performed at [facility], [address] [place], [place] [address]    Report Status PENDING  Incomplete    Coagulation Studies: Recent Labs    12/10/2020 2308  LABPROT 17.6*  INR 1.5*     Urinalysis: No results for input(s): COLORURINE, LABSPEC, PHURINE, GLUCOSEU, HGBUR, BILIRUBINUR, KETONESUR, PROTEINUR, UROBILINOGEN, NITRITE, LEUKOCYTESUR in the last 72 hours.  Invalid input(s): APPERANCEUR    Imaging: DG Chest Port 1 View  Result Date: 12/30/2020 CLINICAL DATA:  Shortness of breath. EXAM: PORTABLE CHEST 1 VIEW COMPARISON:  12/22/2020 FINDINGS: Cardiomegaly and median sternotomy changes again noted. Pulmonary vascular congestion is noted with mild interstitial opacities suggesting mild interstitial edema. No pneumothorax or large pleural effusion identified. No acute bony abnormalities are present. IMPRESSION: Cardiomegaly with pulmonary vascular congestion and probable mild interstitial edema. Electronically Signed  By: Margarette Canada M.D.   On: 12/30/2020 16:40     Medications:      atorvastatin  80 mg Oral Daily   Chlorhexidine Gluconate Cloth  6 each Topical Q0600   epoetin (EPOGEN/PROCRIT) injection  4,000 Units Intravenous Q T,Th,Sa-HD   escitalopram  20 mg Oral Daily   loratadine  10 mg Oral Daily   midodrine  5 mg Oral TID WC   multivitamin  1 tablet Oral QHS   pantoprazole (PROTONIX) IV  40 mg Intravenous Q12H   rOPINIRole  1.5 mg Oral QHS   saccharomyces boulardii  250 mg Oral BID   tamsulosin  0.8 mg Oral Daily   acetaminophen **OR** acetaminophen, fluticasone, melatonin, ondansetron **OR** ondansetron (ZOFRAN) IV  Assessment/ Plan:  85 y.o. male with   end stage renal disease on hemodialysis, hypotension, coronary artery disease status post CABG, hyperlipidemia, BPH, congestive heart failure, COPD, AAA status post stent, peptic ulcer disease was admitted on 12/21/2020 for  Principal Problem:   GI bleeding Active  Problems:   Chronic combined systolic and diastolic heart failure ([name])   Type II diabetes mellitus with renal manifestations ([name])   ESRD (end stage renal disease) ([name])   Anemia in ESRD (end-stage renal disease) ([name])   CAD (coronary artery disease)   Hypotension   Thrombocytopenia ([place])   H/O Clostridium difficile infection  GI bleeding [K92.2]  [facility] [address] Left AVF 72kg  #. ESRD Received dialysis yesterday, total 1L NS bolus given for hypotension. No UF. Next treatment scheduled for Saturday.  #. Anemia of CKD and GI blood loss.  Lab Results  Component Value Date   HGB 7.9 (L) 12/31/2020   Continue low dose EPO with HD Received 1 unit of blood yesterday.  #. Secondary hyperparathyroidism of renal origin N 25.81   No results found for: PTH Lab Results  Component Value Date   PHOS 1.7 (L) 12/31/2020   Monitor calcium and phos level during this admission Phosphorus decreased possibly due to decreased oral intake  # Hypokalemia Likely nutritional Stable Lab Results  Component Value Date   K 3.7 12/31/2020       LOS: [place] 11/25/20221:15 PM  [place], [place]

## 2020-12-31 NOTE — Progress Notes (Signed)
[facility] Progress Note  Patient: Brent Glover    JOI:[id number]  DOA: 12/15/2020    Date of Service: the patient was seen and examined on 12/31/2020  Brief hospital course: 11/22 presented with generalized weakness.  Found to have symptomatic anemia and dehydration. 11/23 nephrology consulted for ESRD.  GI consulted for concern for GI bleed.  C. difficile test ordered.  Assessment and Plan: * GI bleeding Prior to admission, recent hemoglobin around 9. Currently hemoglobin around 7.  After 1 PRBC hemoglobin improved up to 8.  No further bleeding reported by RN. GI recommend no intervention. Continue PPI twice daily.  Transition to p.o. tomorrow if no anemia.  Hypotension Presents with generalized weakness. Blood pressure was 80/30 in the ER.  Hemoglobin dropped from 9.4-7.7.  Hemoccult stool positive. Holding antihypertensive medication. Patient received gentle IV hydration, now on hold. Continue midodrine.  May require higher dose during hemodialysis.  Anemia in ESRD (end-stage renal disease) ([place]) Management per nephrology.  CAD (coronary artery disease) Antiplatelet medications are on hold.  Chronic combined systolic and diastolic heart failure ([name]) Volume status appear elevated. Bilateral edema as well as vascular congestion.  Most likely unable to continue ultrafiltration with hemodialysis is likely the cause.  ESRD (end stage renal disease) [facility]) Appreciate nephrology assistance. Past few weeks patient is having difficulty with blood pressure getting hemodialysis. HD per nephrology.  H/O Clostridium difficile infection C. difficile ordered due to reports of diarrhea on admission. No BM in initial 24 hours.  The test was discontinued. Patient continues to have loose BM and started to have diarrhea again on 24th. ID consult for further assistance. X-ray abdomen negative for any acute abnormality. Less likely The patient is suffering from acute C. difficile  colitis although ongoing diarrhea is concerning. Will get CT abdomen pelvis for further work-up. Will request GI consultation again if the CT abdomen is negative.  Aorto-biiliac stent graft ([place]) Prior history. Will get CT abdomen to rule out any acute abnormality due to ongoing concern with bleeding.  Thrombocytopenia ([place]) Acute on chronic. Chronically low around 100.  On presentation platelet is in 83s.  Will initiate further work-up.  May require hematology input.  Type II diabetes mellitus with renal manifestations ([name]) Continue sliding scale insulin.   Body mass index is 25.9 kg/m.        Subjective: Reports 5 bowel movements last night.  Reports one of them was actually having black tarry stool.  No abdominal pain right now.  And little intake.  No nausea no vomiting.  Continues to have shortness of breath.  Objective: Blood pressure remains on the lower side.  Exam: General: Appear in mild distress, no Rash; Oral Mucosa Clear, moist. no Abnormal Neck Mass Or lumps, Conjunctiva normal  Cardiovascular: S1 and S2 Present, no Murmur, Respiratory: increased respiratory effort, Bilateral Air entry present and bilateral  Crackles, no wheezes Abdomen: Bowel Sound present, Soft and mild distention, no tenderness Extremities: bilateral Pedal edema Neurology: alert and oriented to time, place, and person affect appropriate. no new focal deficit Gait not checked due to patient safety concerns    Data Reviewed: Hemoglobin remaining stable.  Serum creatinine is stable.  Potassium stable.  Disposition:  Status is: Inpatient  Remains inpatient appropriate because: Requiring further work-up for thrombocytopenia, hypotension as well as GI bleed.  Family Communication: Discussed with daughter on the phone.  Left voicemail on spouse phone.  Patient provided permission.  DVT Prophylaxis: SCDs Start: 12/29/20 0045   Time spent: 33  minutes.   Author: Berle Mull  12/31/2020  5:40 PM  To reach On-call, see care teams to locate the attending and reach out via [email or web address]. Between 7PM-7AM, please contact night-coverage If you still have difficulty reaching the attending provider, please page the [facility] (Director on Call) for [facility] on amion for assistance.

## 2020-12-31 NOTE — Evaluation (Addendum)
Physical Therapy Evaluation Patient Details Name: Brent Glover MRN: [id number] DOB: Apr 01, 1935 Today's Date: 12/31/2020  History of Present Illness  Pt admitted to [facility] on 12/09/2020 for c/o weakness and dark stool, found to have symptomatic anemia and dehydration. GI consulted due to concern for GI bleed as pt was recently admitted for GI bleed and anemia. Per GI consult, no intervention needed. PMH significant for: chronic diastolic CHF, COPD, W0JW, CAD s/p CABG, ESRD (HD), HTN, dyslipidemia. Imaging revealed increased vascular congestion without interstitial pulmonary edema.   Clinical Impression  Pt is a 85 year old M admitted to hospital on 12/15/2020 for GI bleed. At baseline, pt is mod I for limited household/community ambulation with QC vs. Rollator (respectively), ADL's, and needs assistance for IADL's and medication management. Pt presents with paresthesia in bil toes, decreased activity tolerance, decreased standing balance, decreased knee ext AROM bil, functional weakness, and impaired skin integrity, resulting in impaired functional mobility. Due to deficits, pt required supervision for bed mobility, CGA-mod assist for transfers, and CGA for limited gait with RW. Further mobility limited secondary to fatigue and RPE of 7-8/10 indicating "vigorous activity" after ambulating 15ft x2. DOE/labored breathing noted during mobility with SpO2 at 90% on RA. Required increased assist for toilet transfer and brief management, however, pt able to be Ind with pericare. Deficits limit the pt's ability to safely and independently perform ADL's, transfer, and ambulate. Pt will benefit from acute skilled PT services to address deficits for return to baseline function. At this time, PT recommends HHPT with intermittent supervision/assist to address deficits and improve overall safety with functional mobility for return to baseline function. Pt agreeable.        Recommendations for follow up therapy are one  component of a multi-disciplinary discharge planning process, led by the attending physician.  Recommendations may be updated based on patient status, additional functional criteria and insurance authorization.  Follow Up Recommendations Home health PT    Assistance Recommended at Discharge Intermittent Supervision/Assistance  Functional Status Assessment Patient has had a recent decline in their functional status and demonstrates the ability to make significant improvements in function in a reasonable and predictable amount of time.  Equipment Recommendations  None recommended by PT    Recommendations for Other Services OT consult     Precautions / Restrictions Precautions Precautions: Fall Precaution Comments: O2 >/= 92% Restrictions Weight Bearing Restrictions: No Other Position/Activity Restrictions: 1256mL fluid restriction      Mobility  Bed Mobility Overal bed mobility: Needs Assistance Bed Mobility: Supine to Sit     Supine to sit: Supervision;HOB elevated     General bed mobility comments: increased time/effort with BUE support on handrails    Transfers Overall transfer level: Needs assistance Equipment used: Rolling walker (2 wheels) Transfers: Sit to/from Stand Sit to Stand: Min guard;Mod assist           General transfer comment: CGA from EOB and mod assist from [facility] with RW; verbal cues for safety, sequencing, and hand placement    Ambulation/Gait Ambulation/Gait assistance: Min guard Gait Distance (Feet): 54 Feet (69ft x2) Assistive device: Rolling walker (2 wheels)         General Gait Details: decreased knee ext bil, decreased RW proximity, decreased step length/foot clearance bil, forward flexed posture, and slowed cadence. Verbal cues for RW proximity     Balance Overall balance assessment: Needs assistance Sitting-balance support: No upper extremity supported;Feet supported Sitting balance-Leahy Scale: Fair Sitting balance - Comments:  posterior lean requiring assist  from PT for correction after donning socks in figure 4 position   Standing balance support: Bilateral upper extremity supported;During functional activity Standing balance-Leahy Scale: Fair Standing balance comment: in RW due to decreased RW proximity and decreased bil knee ext                             Pertinent Vitals/Pain Pain Assessment: No/denies pain    Home Living Family/patient expects to be discharged to:: Private residence Living Arrangements: Spouse/significant other;Children Available Help at Discharge: Family;Available 24 hours/day (spouse able to provide 24/7 supervision, with min assist; grandson and other family able to provide PRN assist) Type of Home: House Home Access: Stairs to enter Entrance Stairs-Rails: Right Entrance Stairs-Number of Steps: 3 (from garage)   Home Layout: One level Home Equipment: Merchant [employer] officer (4 wheels);Rolling Walker (2 wheels);Shower seat - built in;Grab bars - tub/shower;Grab bars - toilet      Prior Function Prior Level of Function : Independent/Modified Independent;Needs assist;History of Falls (last six months)       Physical Assist : ADLs (physical)   ADLs (physical): IADLs Mobility Comments: Pt reports using QC for limited household ambulation, and rollator for limited community ambulation. Per chart review, pt ambulates 50-172ft at a time. Pt states he was active with HHPT prior to hospital admission, with PT coming once a week. Also states an RN was coming to assist at home, but couldn't state what they assisted with. ADLs Comments: Pt reports being Ind with ADL's and requiring assist for IADL's and medication management.     Hand Dominance   Dominant Hand: Right    Extremity/Trunk Assessment   Upper Extremity Assessment Upper Extremity Assessment: Overall WFL for tasks assessed (Grossly 4/5; sensation intact)    Lower Extremity Assessment Lower Extremity Assessment:  Overall WFL for tasks assessed (Grossly 4+/5; limited knee ext bil by ~15deg; sensation intact except c/o N/T in tips of bil toes)       Communication   Communication: HOH  Cognition Arousal/Alertness: Awake/alert Behavior During Therapy: WFL for tasks assessed/performed Overall Cognitive Status: Within Functional Limits for tasks assessed                                 General Comments: A&O x4; able to follow 100% of simple 2-step commands        General Comments General comments (skin integrity, edema, etc.): multiple bruises and scabs on extremities and face/head; pt stating some of them are from falls    Exercises Other Exercises Other Exercises: Participated in bed mobility, transfers, toileting, and gait. Required assist for brief management with toileting, but was Ind with pericare. DOE/labored breathing after session with SpO2 at 90% on RA. Other Exercises: Pt educated re: PT role/POC, DC recommendations, benefits of OOB mobility, sitting in chair, ambulating to bathroom with nursing, safety with mobility in RW.   Assessment/Plan    PT Assessment Patient needs continued PT services  PT Problem List Decreased strength;Decreased range of motion;Decreased activity tolerance;Decreased balance;Decreased mobility;Cardiopulmonary status limiting activity;Impaired sensation;Decreased skin integrity       PT Treatment Interventions Gait training;Stair training;Functional mobility training;Therapeutic activities;Therapeutic exercise;Balance training;Neuromuscular re-education    PT Goals (Current goals can be found in the Care Plan section)  Acute Rehab PT Goals Patient Stated Goal: "go home" PT Goal Formulation: With patient Time For Goal Achievement: 01/14/21 Potential to Achieve Goals: Good  Frequency Min 2X/week   Barriers to discharge Decreased caregiver support spouse available 24/7 supervision, able to provide min assist. Family available for PRN  assist       AM-PAC PT "6 Clicks" Mobility  Outcome Measure Help needed turning from your back to your side while in a flat bed without using bedrails?: A Little Help needed moving from lying on your back to sitting on the side of a flat bed without using bedrails?: A Little Help needed moving to and from a bed to a chair (including a wheelchair)?: A Little Help needed standing up from a chair using your arms (e.g., wheelchair or bedside chair)?: A Lot Help needed to walk in hospital room?: A Little Help needed climbing 3-5 steps with a railing? : A Little 6 Click Score: 17    End of Session Equipment Utilized During Treatment: Gait belt Activity Tolerance: Patient tolerated treatment well Patient left: in chair;with call bell/phone within reach;with chair alarm set Nurse Communication: Mobility status PT Visit Diagnosis: Unsteadiness on feet (R26.81);History of falling (Z91.81);Other abnormalities of gait and mobility (R26.89);Muscle weakness (generalized) (M62.81)    Time: 4356-8616 PT Time Calculation (min) (ACUTE ONLY): 36 min   Charges:   PT Evaluation $PT Eval Low Complexity: 1 Low PT Treatments $Therapeutic Activity: 8-22 mins        Herminio Commons, PT, DPT 12:22 PM,12/31/20

## 2020-12-31 NOTE — Assessment & Plan Note (Addendum)
Prior history. CT abdomen shows no evidence of acute abnormality.

## 2021-01-01 ENCOUNTER — Inpatient Hospital Stay: Payer: Medicare Other

## 2021-01-01 ENCOUNTER — Inpatient Hospital Stay
Admit: 2021-01-01 | Discharge: 2021-01-01 | Disposition: A | Discharge status: Home / Self Care | Payer: Medicare Other | Attending: Internal Medicine | Admitting: Internal Medicine

## 2021-01-01 DIAGNOSIS — I739 Peripheral vascular disease, unspecified: Secondary | ICD-10-CM | POA: Diagnosis present

## 2021-01-01 DIAGNOSIS — K921 Melena: Secondary | ICD-10-CM | POA: Diagnosis not present

## 2021-01-01 DIAGNOSIS — I714 Abdominal aortic aneurysm, without rupture, unspecified: Secondary | ICD-10-CM

## 2021-01-01 LAB — COMPREHENSIVE METABOLIC PANEL
ALT: 13 U/L (ref 0–44)
AST: 34 U/L (ref 15–41)
Albumin: 2.1 g/dL — ABNORMAL LOW (ref 3.5–5.0)
Alkaline Phosphatase: 108 U/L (ref 38–126)
Anion gap: 3 — ABNORMAL LOW (ref 5–15)
BUN: 9 mg/dL (ref 8–23)
CO2: 32 mmol/L (ref 22–32)
Calcium: 7.4 mg/dL — ABNORMAL LOW (ref 8.9–10.3)
Chloride: 96 mmol/L — ABNORMAL LOW (ref 98–111)
Creatinine, Ser: 2.3 mg/dL — ABNORMAL HIGH (ref 0.61–1.24)
GFR, Estimated: 28 mL/min — ABNORMAL LOW (ref >60)
Glucose, Bld: 89 mg/dL (ref 70–99)
Potassium: 3.3 mmol/L — ABNORMAL LOW (ref 3.5–5.1)
Sodium: 131 mmol/L — ABNORMAL LOW (ref 135–145)
Total Bilirubin: 1.5 mg/dL — ABNORMAL HIGH (ref 0.3–1.2)
Total Protein: 5.7 g/dL — ABNORMAL LOW (ref 6.5–8.1)

## 2021-01-01 LAB — ECHOCARDIOGRAM COMPLETE
AR max vel: 1.42 cm2
AV Area VTI: 1.39 cm2
AV Area mean vel: 1.37 cm2
AV Mean grad: 5 mmHg
AV Peak grad: 10.2 mmHg
Ao pk vel: 1.59 m/s
Area-P 1/2: 4.12 cm2
Height: 67 in
MV VTI: 1.34 cm2
S' Lateral: 4.1 cm
Weight: 2645.52 oz

## 2021-01-01 LAB — CBC WITH DIFFERENTIAL/PLATELET
Abs Immature Granulocytes: 0.01 10*3/uL (ref 0.00–0.07)
Basophils Absolute: 0 10*3/uL (ref 0.0–0.1)
Basophils Relative: 1 %
Eosinophils Absolute: 0.1 10*3/uL (ref 0.0–0.5)
Eosinophils Relative: 3 %
HCT: 25.3 % — ABNORMAL LOW (ref 39.0–52.0)
Hemoglobin: 8.3 g/dL — ABNORMAL LOW (ref 13.0–17.0)
Immature Granulocytes: 0 %
Lymphocytes Relative: 13 %
Lymphs Abs: 0.5 10*3/uL — ABNORMAL LOW (ref 0.7–4.0)
MCH: 32.3 pg (ref 26.0–34.0)
MCHC: 32.8 g/dL (ref 30.0–36.0)
MCV: 98.4 fL (ref 80.0–100.0)
Monocytes Absolute: 0.4 10*3/uL (ref 0.1–1.0)
Monocytes Relative: 10 %
Neutro Abs: 2.7 10*3/uL (ref 1.7–7.7)
Neutrophils Relative %: 73 %
Platelets: 56 10*3/uL — ABNORMAL LOW (ref 150–400)
RBC: 2.57 MIL/uL — ABNORMAL LOW (ref 4.22–5.81)
RDW: 20.5 % — ABNORMAL HIGH (ref 11.5–15.5)
WBC: 3.7 10*3/uL — ABNORMAL LOW (ref 4.0–10.5)
nRBC: 0 % (ref 0.0–0.2)

## 2021-01-01 LAB — PROCALCITONIN: Procalcitonin: <0.1 ng/mL

## 2021-01-01 MED ORDER — IPRATROPIUM-ALBUTEROL 0.5-2.5 (3) MG/3ML IN SOLN
3.0000 mL | Freq: Three times a day (TID) | RESPIRATORY_TRACT | Status: DC
  Administered 2021-01-01: 3 mL via RESPIRATORY_TRACT
  Filled 2021-01-01: qty 3

## 2021-01-01 NOTE — Progress Notes (Signed)
[facility], [place] 01/01/21  Subjective:   LOS: 3  Patient known to our practice from previous admissions. This time he is admitted for generalized weakness, loose stools which are dark in color.    Update: Patient seen and evaluated during hemodialysis treatment today.  Blood flow rate 350 with ultrafiltration target of 0.5 kg.  Resting comfortably during treatment today.   Objective:  Vital signs in last 24 hours:  Temp:  [98 F (36.7 C)-98.3 F (36.8 C)] 98.1 F (36.7 C) (11/26 0940) Pulse Rate:  [57-70] 57 (11/26 1115) Resp:  [17-25] 22 (11/26 1115) BP: (89-118)/(45-71) 103/57 (11/26 1115) SpO2:  [93 %-100 %] 97 % (11/26 1115) Weight:  [70.9 kg] 70.9 kg (11/26 0940)  Weight change:  Filed Weights   12/30/20 1036 12/30/20 1500 01/01/21 0940  Weight: 74 kg 75 kg 70.9 kg    Intake/Output:    Intake/Output Summary (Last 24 hours) at 01/01/2021 1134 Last data filed at 01/01/2021 0805 Gross per 24 hour  Intake 120 ml  Output 0 ml  Net 120 ml      Physical Exam: General: Frail, elderly gentleman, laying in the bed  HEENT Anicteric, hearing intact.  Pulm/lungs Normal breathing effort, clear bilateral  CVS/Heart No rub or gallop  Abdomen:  soft, nontender  Extremities: No edema  Neurologic: Alert, oriented  Skin: No acute rashes  Access: Left forearm AV fistula       Basic Metabolic Panel:  Recent Labs  Lab 12/19/2020 2308 12/29/20 0523 12/30/20 0611 12/31/20 0927  NA 135 137 138 134*  K 3.0* 3.3* 3.5 3.7  CL 95* 97* 100 97*  CO2 33* 32 32 32  GLUCOSE 68* 83 77 105*  BUN 22 24* 31* 17  CREATININE 2.89* 3.37* 4.55* 3.44*  CALCIUM 7.5* 7.5* 7.7* 7.7*  PHOS  --   --  2.8 1.7*      CBC: Recent Labs  Lab 12/14/2020 2308 12/29/20 0523 12/29/20 1103 12/29/20 1648 12/29/20 2202 12/30/20 0611 12/31/20 0927  WBC 4.2 4.0  --   --   --  3.4* 3.6*  NEUTROABS 2.9  --   --   --   --  2.3 2.7  HGB 7.7* 7.7* 7.9* 7.0* 8.0*  8.2* 7.9*  HCT 24.0* 23.5* 23.9* 21.8* 24.2* 25.0* 24.3*  MCV 100.8* 95.5  --   --   --  95.8 96.8  PLT 54* 50*  --   --   --  52* 55*       Lab Results  Component Value Date   HEPBSAG NON REACTIVE 12/29/2020   HEPBSAB Reactive (A) 11/13/2020      Microbiology:  Recent Results (from the past 240 hour(s))  Resp Panel by RT-PCR (Flu A&B, Covid) Nasopharyngeal Swab     Status: None   Collection Time: 12/31/2020 11:08 PM   Specimen: Nasopharyngeal Swab; Nasopharyngeal(NP) swabs in vial transport medium  Result Value Ref Range Status   SARS Coronavirus 2 by RT PCR NEGATIVE NEGATIVE Final    Comment: (NOTE) SARS-CoV-2 target nucleic acids are NOT DETECTED.  The SARS-CoV-2 RNA is generally detectable in upper respiratory specimens during the acute phase of infection. The lowest concentration of SARS-CoV-2 viral copies this assay can detect is 138 copies/mL. A negative result does not preclude SARS-Cov-2 infection and should not be used as the sole basis for treatment or other patient management decisions. A negative result may occur with  improper specimen collection/handling, submission of specimen other than nasopharyngeal  swab, presence of viral mutation(s) within the areas targeted by this assay, and inadequate number of viral copies(<138 copies/mL). A negative result must be combined with clinical observations, patient history, and epidemiological information. The expected result is Negative.  Fact Sheet for Patients:  EntrepreneurPulse.com.au  Fact Sheet for Healthcare Providers:  IncredibleEmployment.be  This test is no t yet approved or cleared by the [place] FDA and  has been authorized for detection and/or diagnosis of SARS-CoV-2 by FDA under an Emergency Use Authorization (EUA). This EUA will remain  in effect (meaning this test can be used) for the duration of the COVID-19 declaration under Section 564(b)(1) of the Act,  21 U.S.C.section 360bbb-3(b)(1), unless the authorization is terminated  or revoked sooner.       Influenza A by PCR NEGATIVE NEGATIVE Final   Influenza B by PCR NEGATIVE NEGATIVE Final    Comment: (NOTE) The Xpert Xpress SARS-CoV-2/FLU/RSV plus assay is intended as an aid in the diagnosis of influenza from Nasopharyngeal swab specimens and should not be used as a sole basis for treatment. Nasal washings and aspirates are unacceptable for Xpert Xpress SARS-CoV-2/FLU/RSV testing.  Fact Sheet for Patients: EntrepreneurPulse.com.au  Fact Sheet for Healthcare Providers: IncredibleEmployment.be  This test is not yet approved or cleared by the [place] FDA and has been authorized for detection and/or diagnosis of SARS-CoV-2 by FDA under an Emergency Use Authorization (EUA). This EUA will remain in effect (meaning this test can be used) for the duration of the COVID-19 declaration under Section 564(b)(1) of the Act, 21 U.S.C. section 360bbb-3(b)(1), unless the authorization is terminated or revoked.  Performed at [facility], [address] [place], [place] [address]   Blood Culture (routine x 2)     Status: None (Preliminary result)   Collection Time: 12/17/2020 11:09 PM   Specimen: BLOOD LEFT FOREARM  Result Value Ref Range Status   Specimen Description BLOOD LEFT FOREARM  Final   Special Requests   Final    BOTTLES DRAWN AEROBIC AND ANAEROBIC Blood Culture adequate volume   Culture   Final    NO GROWTH 4 DAYS Performed at [facility], [address] [place], [place] [address]    Report Status PENDING  Incomplete  Blood Culture (routine x 2)     Status: None (Preliminary result)   Collection Time: 12/08/2020 11:09 PM   Specimen: BLOOD RIGHT FOREARM  Result Value Ref Range Status   Specimen Description BLOOD RIGHT FOREARM  Final   Special Requests   Final    BOTTLES DRAWN AEROBIC AND ANAEROBIC Blood Culture  results may not be optimal due to an excessive volume of blood received in culture bottles   Culture   Final    NO GROWTH 4 DAYS Performed at [facility], [address] [place], [place] [address]    Report Status PENDING  Incomplete    Coagulation Studies: No results for input(s): LABPROT, INR in the last 72 hours.   Urinalysis: No results for input(s): COLORURINE, LABSPEC, PHURINE, GLUCOSEU, HGBUR, BILIRUBINUR, KETONESUR, PROTEINUR, UROBILINOGEN, NITRITE, LEUKOCYTESUR in the last 72 hours.  Invalid input(s): APPERANCEUR    Imaging: DG Chest Port 1 View  Result Date: 12/30/2020 CLINICAL DATA:  Shortness of breath. EXAM: PORTABLE CHEST 1 VIEW COMPARISON:  12/10/2020 FINDINGS: Cardiomegaly and median sternotomy changes again noted. Pulmonary vascular congestion is noted with mild interstitial opacities suggesting mild interstitial edema. No pneumothorax or large pleural effusion identified. No acute bony abnormalities are present. IMPRESSION: Cardiomegaly with  pulmonary vascular congestion and probable mild interstitial edema. Electronically Signed   By: Margarette Canada M.D.   On: 12/30/2020 16:40   DG Abd Portable 1V  Result Date: 12/31/2020 CLINICAL DATA:  85 year old male with abdominal distension. EXAM: PORTABLE ABDOMEN - 1 VIEW COMPARISON:  None. FINDINGS: Nonspecific, nonobstructive bowel gas pattern. Calcified gallstones in the right upper quadrant. Aorto bi-iliac endograft in place. Atherosclerotic calcifications are noted. Age-indeterminate posterolateral left ninth and tenth rib fractures with mild displacement. IMPRESSION: 1. Nonspecific, nonobstructive bowel gas pattern. 2. Age-indeterminate but likely chronic posterolateral left ninth and tenth rib fractures with mild displacement. 3. Cholelithiasis. Electronically Signed   By: Ruthann Cancer M.D.   On: 12/31/2020 14:55   CT Angio Abd/Pel w/ and/or w/o  Result Date: 12/31/2020 CLINICAL DATA:  Post abdominal aortic  aneurysm stenting; end-stage renal disease, missed dialysis recently, third-spacing EXAM: CTA ABDOMEN AND PELVIS WITHOUT AND WITH CONTRAST TECHNIQUE: Multidetector CT imaging of the abdomen and pelvis was performed using the standard protocol during bolus administration of intravenous contrast. Multiplanar reconstructed images and MIPs were obtained and reviewed to evaluate the vascular anatomy. CONTRAST:  170mL OMNIPAQUE IOHEXOL 350 MG/ML SOLN IV COMPARISON:  09/29/2017 FINDINGS: VASCULAR Aorta: Scattered atherosclerotic calcifications aorta. Aneurysmal dilatation distal abdominal aorta 5.4 x 5.4 cm series 12 image 32 previously 5.3 x 5.3 cm, extending 9.0 cm length previously 8.6 cm. Post placement of aortic stent with iliac extending from proximal margin of origin of SMA to distal common iliac arteries. Expected enhancement of aortic lumen and the lumens of the stents. No abnormal enhancement within native aneurysm to suggest endoleak. Celiac: Significant atherosclerotic plaque at origin of celiac artery, with mild narrowing. SMA: Significant plaque at proximal SMA extending throughout SMA with moderate narrowing. The endoluminal stent extends across the orifice of the SMA though normal enhancement of the SMA lumen is seen. Significant plaque at the origins of atrophic renal arteries bilaterally. Renals: Significant plaque at the origins of atrophic renal arteries bilaterally. IMA: Occluded at origin.  Reconstitutes by collaterals. Inflow: Scattered atherosclerotic plaques within the common, internal common external iliac arteries bilaterally. LEFT common iliac artery and stent 2.0 cm diameter. RIGHT common iliac artery and stent 2.0 cm diameter. Proximal Outflow: Atherosclerotic plaque formation at common femoral and proximal superficial femoral arteries, suspect significant narrowing at the proximal SFA bilaterally. Veins: Patent Review of the MIP images confirms the above findings. NON-VASCULAR Lower chest:  BILATERAL pleural effusions in compressive atelectasis of the adjacent lower lobes. Interstitial thickening question edema at the lower lungs bilaterally. Enlargement of cardiac chambers. Hepatobiliary: Gallbladder contracted containing multiple calcified stones. Liver unremarkable. Pancreas: Normal appearance Spleen: Normal appearance Adrenals/Urinary Tract: Adrenal glands normal appearance. Markedly atrophic BILATERAL kidneys which contain tiny calculi versus renal vascular calcifications. No mass or hydronephrosis. Ureters and decompressed bladder unremarkable. Stomach/Bowel: Sigmoid diverticulosis without evidence of diverticulitis. Stomach and bowel loops normal appearance. Appendix not identified. Lymphatic: No adenopathy Reproductive: Unremarkable prostate gland and seminal vesicles Other: Scattered ascites, low attenuation. No free air. RIGHT inguinal hernia containing ascites and minimal fat. Musculoskeletal: Unremarkable IMPRESSION: Post stenting of abdominal aortic aneurysm with 5.5 cm diameter infrarenal abdominal aortic aneurysm, previously 5.3 x 5.3 cm, without evidence of endoleak. Stent extends across the SMA orifice though the SMA enhances normally; position of stent is unchanged from prior exam. Significant plaque at the origins of the celiac, superior mesenteric and atrophic renal arteries with moderate SMA stenosis and mild celiac artery stenosis. Cholelithiasis. Marked BILATERAL renal atrophy. Significant ascites likely due  to known end-stage renal disease and missing dialysis. Sigmoid diverticulosis without evidence of diverticulitis. RIGHT inguinal hernia containing ascites and minimal fat. Enlargement of cardiac chambers with small BILATERAL pleural effusions and minimal pulmonary edema at the lower lungs. Aortic Atherosclerosis (ICD10-I70.0). Electronically Signed   By: Lavonia Dana M.D.   On: 12/31/2020 18:55     Medications:      atorvastatin  80 mg Oral Daily   Chlorhexidine  Gluconate Cloth  6 each Topical Q0600   epoetin (EPOGEN/PROCRIT) injection  4,000 Units Intravenous Q T,Th,Sa-HD   escitalopram  20 mg Oral Daily   loratadine  10 mg Oral Daily   midodrine  5 mg Oral TID WC   multivitamin  1 tablet Oral QHS   pantoprazole (PROTONIX) IV  40 mg Intravenous Q12H   rOPINIRole  1.5 mg Oral QHS   tamsulosin  0.8 mg Oral Daily   acetaminophen **OR** acetaminophen, fluticasone, melatonin, ondansetron **OR** ondansetron (ZOFRAN) IV  Assessment/ Plan:  85 y.o. male with   end stage renal disease on hemodialysis, hypotension, coronary artery disease status post CABG, hyperlipidemia, BPH, congestive heart failure, COPD, AAA status post stent, peptic ulcer disease was admitted on 12/09/2020 for  Principal Problem:   GI bleeding Active Problems:   Chronic combined systolic and diastolic heart failure ([name])   H/O coronary artery bypass surgery   Type II diabetes mellitus with renal manifestations ([name])   ESRD (end stage renal disease) ([name])   Anemia in ESRD (end-stage renal disease) ([name])   CAD (coronary artery disease)   Hypotension   Thrombocytopenia ([name])   H/O Clostridium difficile infection   Aorto-biiliac stent graft ([place])  GI bleeding [K92.2]  [facility] TTS [facility] [address] Left AVF 72kg  #. ESRD Patient seen and evaluated during hemodialysis treatment today.  Tolerating well at the moment.  Ultrafiltration target 0.5 kg.  We will plan to complete dialysis treatment today.  #. Anemia of CKD and GI blood loss.  Lab Results  Component Value Date   HGB 7.9 (L) 12/31/2020   Administer Epogen 4000 units IV with dialysis today.  Continue to monitor serum phosphorus as it was low as documented above.   #. Secondary hyperparathyroidism of renal origin N 25.81   No results found for: PTH Lab Results  Component Value Date   PHOS 1.7 (L) 12/31/2020   Monitor calcium and phos level during this admission Phosphorus decreased possibly due to  decreased oral intake  # Hypokalemia  Potassium improved to 3.7 and acceptable at the moment. Lab Results  Component Value Date   K 3.7 12/31/2020       LOS: 3 Juliene Kirsh 11/26/202211:34 AM  [employer] [place], [place]

## 2021-01-01 NOTE — Assessment & Plan Note (Signed)
CTA shows significant plaque at the origins of celiac superior mesenteric and renal arteries moderate SMA stenosis. Placing the patient at high risk of poor outcome.

## 2021-01-01 NOTE — Progress Notes (Signed)
*  PRELIMINARY RESULTS* Echocardiogram 2D Echocardiogram has been performed.  Sherrie Sport 01/01/2021, 9:21 AM

## 2021-01-01 NOTE — Progress Notes (Signed)
[facility] Progress Note  Patient: Brent Glover    KDX:[id number]  DOA: 12/09/2020    Date of Service: the patient was seen and examined on 01/01/2021  Brief hospital course: 11/22 presented with generalized weakness.  Found to have symptomatic anemia and dehydration. 11/23 nephrology consulted for ESRD.  GI consulted for concern for GI bleed.  C. difficile test ordered.   Assessment and Plan: * GI bleeding Prior to admission, recent hemoglobin around 9. On admission hemoglobin around 7.   After 1 PRBC hemoglobin improved up to 8.   No further bleeding reported by RN. GI recommend no intervention.  Although given patient's ongoing positive Hemoccult patient will benefit from outpatient GI referral at least. Continue PPI twice daily.  Transition to p.o.  Hypotension Presents with generalized weakness. Blood pressure was 80/30 in the ER.  Hemoglobin dropped from 9.4-7.7.  Hemoccult stool positive. Holding antihypertensive medication. Patient received gentle IV hydration, now on hold. Continue midodrine.  May require higher dose during hemodialysis.  Anemia in ESRD (end-stage renal disease) ([place]) Management per nephrology.  CAD (coronary artery disease) Antiplatelet medications are on hold.  Chronic combined systolic and diastolic heart failure ([name]) Volume status appear elevated. Bilateral edema as well as vascular congestion. Chest x-ray also shows vascular congestion. Patient is unable to tolerate ultrafiltration with hemodialysis which is likely the cause of volume overload. Completed full session of HD on 11/26 finally.  ESRD (end stage renal disease) [facility]) Appreciate nephrology assistance. Past few weeks patient is having difficulty with blood pressure getting hemodialysis. HD per nephrology.  AAA (abdominal aortic aneurysm) without rupture CTA shows patient is S/P stenting of abdominal aortic aneurysm with 5.5 cm diameter infrarenal abdominal aortic  aneurysm, previously 5.3 x 5.3 cm, without evidence of endoleak. Outpatient follow-up with vascular surgery recommended.  H/O Clostridium difficile infection C. difficile ordered due to reports of diarrhea on admission. No BM in initial 24 hours.  The test was discontinued. Patient continues to have loose BM and started to have diarrhea again on 24th. ID consult for further assistance. X-ray abdomen negative for any acute abnormality. Less likely The patient is suffering from acute C. difficile colitis although ongoing diarrhea is concerning. CT angiogram shows no evidence of intra-abdominal pathology. May require GI consultation again if continues to have anemia with diarrhea.  PVD (peripheral vascular disease) ([name]) CTA shows significant plaque at the origins of celiac superior mesenteric and renal arteries moderate SMA stenosis. Placing the patient at high risk of poor outcome.  Aorto-biiliac stent graft ([place]) Prior history. CT abdomen shows no evidence of acute abnormality.  Thrombocytopenia ([place]) Acute on chronic. Chronically low around 100.  On presentation platelet is in 60s.  Z76 and folic acid checked recently were unremarkable.  May require hematology input.  Type II diabetes mellitus with renal manifestations ([name]) Continue sliding scale insulin.    Body mass index is 24.48 kg/m.        Subjective: Reports diarrhea.  No abdominal pain.  No nausea no vomiting but no fever no chills.  Tolerated HD today.  Objective: Vital signs were reviewed and unremarkable.  Exam: General: Appear in mild distress, no Rash; Oral Mucosa Clear, moist. no Abnormal Neck Mass Or lumps, Conjunctiva normal  Cardiovascular: S1 and S2 Present, no Murmur, Respiratory: increased respiratory effort, Bilateral Air entry present and bilateral  Crackles, severe bilateral wheezes Abdomen: Bowel Sound present, Soft and no tenderness Extremities: Bilateral pedal edema Neurology: alert and  oriented to time, place, and person  affect appropriate. no new focal deficit Gait not checked due to patient safety concerns     Data Reviewed: My review of labs, imaging, notes and other tests is significant for Stable hemoglobin, stable platelets    Disposition:  Status is: Inpatient  Remains inpatient appropriate because: Ongoing work-up for thrombocytopenia and anemia   Family Communication: Discussed with daughter on 11/25.  DVT Prophylaxis: SCDs Start: 12/29/20 0045   Time spent: 35 minutes.   Author: Berle Mull  01/01/2021 7:09 PM  To reach On-call, see care teams to locate the attending and reach out via [email or web address]. Between 7PM-7AM, please contact night-coverage If you still have difficulty reaching the attending provider, please page the [facility] (Director on Call) for [facility] on amion for assistance.

## 2021-01-01 NOTE — Progress Notes (Signed)
Tolerated 3.5hours of dialysis without complications.No fluid removal.Hemastasis achieved,sites dressed with gauze/taped.Patient with right elbow skin tear, noted post dialysis,gauze applied to site.

## 2021-01-01 NOTE — Assessment & Plan Note (Signed)
CTA shows patient is S/P stenting of abdominal aortic aneurysm with 5.5 cm diameter infrarenal abdominal aortic aneurysm, previously 5.3 x 5.3 cm, without evidence of endoleak. Outpatient follow-up with vascular surgery recommended.

## 2021-01-02 DIAGNOSIS — N186 End stage renal disease: Secondary | ICD-10-CM | POA: Diagnosis not present

## 2021-01-02 DIAGNOSIS — R197 Diarrhea, unspecified: Secondary | ICD-10-CM

## 2021-01-02 DIAGNOSIS — D61818 Other pancytopenia: Secondary | ICD-10-CM

## 2021-01-02 DIAGNOSIS — I959 Hypotension, unspecified: Secondary | ICD-10-CM | POA: Diagnosis not present

## 2021-01-02 DIAGNOSIS — K921 Melena: Secondary | ICD-10-CM | POA: Diagnosis not present

## 2021-01-02 DIAGNOSIS — D631 Anemia in chronic kidney disease: Secondary | ICD-10-CM

## 2021-01-02 DIAGNOSIS — E785 Hyperlipidemia, unspecified: Secondary | ICD-10-CM

## 2021-01-02 LAB — CBC WITH DIFFERENTIAL/PLATELET
Abs Immature Granulocytes: 0.01 10*3/uL (ref 0.00–0.07)
Basophils Absolute: 0 10*3/uL (ref 0.0–0.1)
Basophils Relative: 0 %
Eosinophils Absolute: 0.1 10*3/uL (ref 0.0–0.5)
Eosinophils Relative: 2 %
HCT: 23.7 % — ABNORMAL LOW (ref 39.0–52.0)
Hemoglobin: 7.7 g/dL — ABNORMAL LOW (ref 13.0–17.0)
Immature Granulocytes: 0 %
Lymphocytes Relative: 11 %
Lymphs Abs: 0.5 10*3/uL — ABNORMAL LOW (ref 0.7–4.0)
MCH: 31.6 pg (ref 26.0–34.0)
MCHC: 32.5 g/dL (ref 30.0–36.0)
MCV: 97.1 fL (ref 80.0–100.0)
Monocytes Absolute: 0.5 10*3/uL (ref 0.1–1.0)
Monocytes Relative: 10 %
Neutro Abs: 3.6 10*3/uL (ref 1.7–7.7)
Neutrophils Relative %: 77 %
Platelets: 59 10*3/uL — ABNORMAL LOW (ref 150–400)
RBC: 2.44 MIL/uL — ABNORMAL LOW (ref 4.22–5.81)
RDW: 20.2 % — ABNORMAL HIGH (ref 11.5–15.5)
WBC: 4.8 10*3/uL (ref 4.0–10.5)
nRBC: 0 % (ref 0.0–0.2)

## 2021-01-02 LAB — RENAL FUNCTION PANEL
Albumin: 1.8 g/dL — ABNORMAL LOW (ref 3.5–5.0)
Anion gap: 4 — ABNORMAL LOW (ref 5–15)
BUN: 11 mg/dL (ref 8–23)
CO2: 31 mmol/L (ref 22–32)
Calcium: 7.6 mg/dL — ABNORMAL LOW (ref 8.9–10.3)
Chloride: 98 mmol/L (ref 98–111)
Creatinine, Ser: 3.06 mg/dL — ABNORMAL HIGH (ref 0.61–1.24)
GFR, Estimated: 20 mL/min — ABNORMAL LOW (ref >60)
Glucose, Bld: 114 mg/dL — ABNORMAL HIGH (ref 70–99)
Phosphorus: 1.2 mg/dL — ABNORMAL LOW (ref 2.5–4.6)
Potassium: 3.5 mmol/L (ref 3.5–5.1)
Sodium: 133 mmol/L — ABNORMAL LOW (ref 135–145)

## 2021-01-02 LAB — PROCALCITONIN: Procalcitonin: 0.1 ng/mL

## 2021-01-02 LAB — CULTURE, BLOOD (ROUTINE X 2)
Culture: NO GROWTH
Culture: NO GROWTH
Special Requests: ADEQUATE

## 2021-01-02 MED ORDER — IPRATROPIUM-ALBUTEROL 0.5-2.5 (3) MG/3ML IN SOLN: 3.0000 mL | Freq: Four times a day (QID) | RESPIRATORY_TRACT | Status: DC | PRN

## 2021-01-02 MED ORDER — LOPERAMIDE HCL 2 MG PO CAPS
2.0000 mg | ORAL_CAPSULE | Freq: Once | ORAL | Status: AC
  Administered 2021-01-02: 22:00:00 2 mg via ORAL
  Filled 2021-01-02: qty 1

## 2021-01-02 MED ORDER — DICYCLOMINE HCL 10 MG PO CAPS
10.0000 mg | ORAL_CAPSULE | Freq: Three times a day (TID) | ORAL | Status: DC
  Administered 2021-01-02 – 2021-01-05 (×12): 10 mg via ORAL
  Filled 2021-01-02 (×20): qty 1

## 2021-01-02 MED ORDER — FUROSEMIDE 10 MG/ML IJ SOLN
20.0000 mg | Freq: Once | INTRAMUSCULAR | Status: AC
  Administered 2021-01-02: 11:00:00 20 mg via INTRAVENOUS
  Filled 2021-01-02: qty 2

## 2021-01-02 MED ORDER — IPRATROPIUM-ALBUTEROL 0.5-2.5 (3) MG/3ML IN SOLN
3.0000 mL | Freq: Four times a day (QID) | RESPIRATORY_TRACT | Status: DC
  Administered 2021-01-02 – 2021-01-06 (×15): 3 mL via RESPIRATORY_TRACT
  Filled 2021-01-02 (×15): qty 3

## 2021-01-02 NOTE — Plan of Care (Signed)
  Problem: Activity: Goal: Risk for activity intolerance will decrease Outcome: Progressing   Problem: Safety: Goal: Ability to remain free from injury will improve Outcome: Progressing   

## 2021-01-02 NOTE — Progress Notes (Signed)
Patient ID: Brent Glover, male   DOB: May 16, 1935, 85 y.o.   MRN: [id number] [facility] PROGRESS NOTE  Brent Glover WIO:[id number] DOB: 07/22/1935 DOA: 12/08/2020 PCP: Christen Bame, DO  HPI/Subjective: Patient stated that he had 2 bowel movements today.  We see 1 documented.  He states that they were looking.  But the last bowel movement was marked as a stage II stool.  No abdominal pain.  Objective: Vitals:   01/02/21 1128 01/02/21 1207  BP: 98/62 113/61  Pulse: 66 64  Resp: (!) 21 19  Temp: 98.1 F (36.7 C) 98.1 F (36.7 C)  SpO2: 95% 98%    Intake/Output Summary (Last 24 hours) at 01/02/2021 1542 Last data filed at 01/02/2021 1206 Gross per 24 hour  Intake 240 ml  Output 0 ml  Net 240 ml   Filed Weights   12/30/20 1036 12/30/20 1500 01/01/21 0940  Weight: 74 kg 75 kg 70.9 kg    ROS: Review of Systems  Respiratory:  Negative for shortness of breath.   Cardiovascular:  Negative for chest pain.  Gastrointestinal:  Positive for diarrhea. Negative for abdominal pain, nausea and vomiting.  Exam: Physical Exam HENT:     Head: Normocephalic.     Mouth/Throat:     Pharynx: No oropharyngeal exudate.  Eyes:     General: Lids are normal.     Conjunctiva/sclera: Conjunctivae normal.  Cardiovascular:     Rate and Rhythm: Normal rate and regular rhythm.     Heart sounds: Normal heart sounds, S1 normal and S2 normal.  Pulmonary:     Breath sounds: Normal breath sounds. No decreased breath sounds, wheezing, rhonchi or rales.  Abdominal:     Palpations: Abdomen is soft.     Tenderness: There is no abdominal tenderness.  Musculoskeletal:     Right lower leg: Swelling present.     Left lower leg: Swelling present.  Skin:    General: Skin is warm.     Findings: No rash.  Neurological:     Mental Status: He is alert and oriented to person, place, and time.      Scheduled Meds:  atorvastatin  80 mg Oral Daily   Chlorhexidine Gluconate Cloth  6  each Topical Q0600   dicyclomine  10 mg Oral TID AC & HS   epoetin (EPOGEN/PROCRIT) injection  4,000 Units Intravenous Q T,Th,Sa-HD   escitalopram  20 mg Oral Daily   ipratropium-albuterol  3 mL Nebulization Q6H   loperamide  2 mg Oral Once   loratadine  10 mg Oral Daily   midodrine  5 mg Oral TID WC   multivitamin  1 tablet Oral QHS   pantoprazole (PROTONIX) IV  40 mg Intravenous Q12H   rOPINIRole  1.5 mg Oral QHS   tamsulosin  0.8 mg Oral Daily    Assessment/Plan:  Diarrhea.  Last bowel movement documented as a stage II stool.  We will give a trial of Bentyl.  Had recent C. difficile infection. GI bleed.  Patient received a unit of packed red blood cells during the hospital course.  Seen by gastroenterology and does not want to do any further procedures. Hypotension on midodrine. End-stage renal disease on dialysis Anemia of end-stage renal disease.  Last hemoglobin 7.7.  Continue to monitor closely History of CAD.  Antiplatelet medications on hold Pancytopenia.  We will get pathologist peripheral smear tomorrow, immature platelet fraction also.  On Epogen for anemia.  Check hepatitis C.  May end up needing hematology  as outpatient. Hyperlipidemia unspecified on atorvastatin COPD with some congestion.  May nebulizer standing dose        Code Status:     Code Status Orders  (From admission, onward)           Start     Ordered   12/29/20 0045  Full code  Continuous        12/29/20 0049           Code Status History     Date Active Date Inactive Code Status Order ID Comments User Context   11/11/2020 2026 11/15/2020 1924 Full Code [id number]  Ivor Costa, MD ED   09/29/2017 1525 10/03/2017 1927 Full Code [id number]  Dustin Flock, MD Inpatient      Family Communication: Tried to call patient's wife but he answered the phone. Disposition Plan: Status is: Inpatient  Time spent: 28 minutes  [place]

## 2021-01-02 NOTE — Plan of Care (Signed)
  Problem: Education: Goal: Knowledge of General Education information will improve Description: Including pain rating scale, medication(s)/side effects and non-pharmacologic comfort measures Outcome: Progressing   Problem: Health Behavior/Discharge Planning: Goal: Ability to manage health-related needs will improve Outcome: Progressing   Problem: Clinical Measurements: Goal: Cardiovascular complication will be avoided Outcome: Progressing  Pt had taken monitor off. Reminded pt that monitor needs to stay on so that we can monitor heart rate.

## 2021-01-02 NOTE — Progress Notes (Signed)
[facility], [place] 01/02/21  Subjective:   LOS: 4  Patient known to our practice from previous admissions. This time he is admitted for generalized weakness, loose stools which are dark in color.    Patient currently seen resting in bed, currently on enteric precautions States his appetite is improving No other complaints at this time   Objective:  Vital signs in last 24 hours:  Temp:  [97.7 F (36.5 C)-99.1 F (37.3 C)] 98.1 F (36.7 C) (11/27 1207) Pulse Rate:  [63-68] 64 (11/27 1207) Resp:  [17-21] 19 (11/27 1207) BP: (93-113)/(44-62) 113/61 (11/27 1207) SpO2:  [91 %-100 %] 98 % (11/27 1207)  Weight change:  Filed Weights   12/30/20 1036 12/30/20 1500 01/01/21 0940  Weight: 74 kg 75 kg 70.9 kg    Intake/Output:    Intake/Output Summary (Last 24 hours) at 01/02/2021 1528 Last data filed at 01/02/2021 1206 Gross per 24 hour  Intake 240 ml  Output 0 ml  Net 240 ml      Physical Exam: General: Frail, elderly gentleman, laying in the bed  HEENT Anicteric, hearing intact.  Pulm/lungs Normal breathing effort, clear bilateral  CVS/Heart No rub or gallop  Abdomen:  soft, nontender  Extremities: No edema  Neurologic: Alert, oriented  Skin: No acute rashes  Access: Left forearm AV fistula       Basic Metabolic Panel:  Recent Labs  Lab 12/29/20 0523 12/30/20 0611 12/31/20 0927 01/01/21 1523 01/02/21 0344  NA 137 138 134* 131* 133*  K 3.3* 3.5 3.7 3.3* 3.5  CL 97* 100 97* 96* 98  CO2 32 32 32 32 31  GLUCOSE 83 77 105* 89 114*  BUN 24* 31* 17 9 11   CREATININE 3.37* 4.55* 3.44* 2.30* 3.06*  CALCIUM 7.5* 7.7* 7.7* 7.4* 7.6*  PHOS  --  2.8 1.7*  --  1.2*      CBC: Recent Labs  Lab 12/10/2020 2308 12/29/20 0523 12/29/20 1103 12/29/20 2202 12/30/20 0611 12/31/20 0927 01/01/21 1523 01/02/21 0344  WBC 4.2 4.0  --   --  3.4* 3.6* 3.7* 4.8  NEUTROABS 2.9  --   --   --  2.3 2.7 2.7 3.6  HGB 7.7* 7.7*   < > 8.0* 8.2*  7.9* 8.3* 7.7*  HCT 24.0* 23.5*   < > 24.2* 25.0* 24.3* 25.3* 23.7*  MCV 100.8* 95.5  --   --  95.8 96.8 98.4 97.1  PLT 54* 50*  --   --  52* 55* 56* 59*   < > = values in this interval not displayed.       Lab Results  Component Value Date   HEPBSAG NON REACTIVE 12/29/2020   HEPBSAB Reactive (A) 11/13/2020      Microbiology:  Recent Results (from the past 240 hour(s))  Resp Panel by RT-PCR (Flu A&B, Covid) Nasopharyngeal Swab     Status: None   Collection Time: 12/07/2020 11:08 PM   Specimen: Nasopharyngeal Swab; Nasopharyngeal(NP) swabs in vial transport medium  Result Value Ref Range Status   SARS Coronavirus 2 by RT PCR NEGATIVE NEGATIVE Final    Comment: (NOTE) SARS-CoV-2 target nucleic acids are NOT DETECTED.  The SARS-CoV-2 RNA is generally detectable in upper respiratory specimens during the acute phase of infection. The lowest concentration of SARS-CoV-2 viral copies this assay can detect is 138 copies/mL. A negative result does not preclude SARS-Cov-2 infection and should not be used as the sole basis for treatment or other patient management decisions. A  negative result may occur with  improper specimen collection/handling, submission of specimen other than nasopharyngeal swab, presence of viral mutation(s) within the areas targeted by this assay, and inadequate number of viral copies(<138 copies/mL). A negative result must be combined with clinical observations, patient history, and epidemiological information. The expected result is Negative.  Fact Sheet for Patients:  EntrepreneurPulse.com.au  Fact Sheet for Healthcare Providers:  IncredibleEmployment.be  This test is no t yet approved or cleared by the [place] FDA and  has been authorized for detection and/or diagnosis of SARS-CoV-2 by FDA under an Emergency Use Authorization (EUA). This EUA will remain  in effect (meaning this test can be used) for the duration  of the COVID-19 declaration under Section 564(b)(1) of the Act, 21 U.S.C.section 360bbb-3(b)(1), unless the authorization is terminated  or revoked sooner.       Influenza A by PCR NEGATIVE NEGATIVE Final   Influenza B by PCR NEGATIVE NEGATIVE Final    Comment: (NOTE) The Xpert Xpress SARS-CoV-2/FLU/RSV plus assay is intended as an aid in the diagnosis of influenza from Nasopharyngeal swab specimens and should not be used as a sole basis for treatment. Nasal washings and aspirates are unacceptable for Xpert Xpress SARS-CoV-2/FLU/RSV testing.  Fact Sheet for Patients: EntrepreneurPulse.com.au  Fact Sheet for Healthcare Providers: IncredibleEmployment.be  This test is not yet approved or cleared by the [place] FDA and has been authorized for detection and/or diagnosis of SARS-CoV-2 by FDA under an Emergency Use Authorization (EUA). This EUA will remain in effect (meaning this test can be used) for the duration of the COVID-19 declaration under Section 564(b)(1) of the Act, 21 U.S.C. section 360bbb-3(b)(1), unless the authorization is terminated or revoked.  Performed at [facility], [address] [place], [place] [address]   Blood Culture (routine x 2)     Status: None   Collection Time: 12/12/2020 11:09 PM   Specimen: BLOOD LEFT FOREARM  Result Value Ref Range Status   Specimen Description BLOOD LEFT FOREARM  Final   Special Requests   Final    BOTTLES DRAWN AEROBIC AND ANAEROBIC Blood Culture adequate volume   Culture   Final    NO GROWTH 5 DAYS Performed at [facility], [address] [place], [place] [address]    Report Status 01/02/2021 FINAL  Final  Blood Culture (routine x 2)     Status: None   Collection Time: 01/02/2021 11:09 PM   Specimen: BLOOD RIGHT FOREARM  Result Value Ref Range Status   Specimen Description BLOOD RIGHT FOREARM  Final   Special Requests   Final    BOTTLES DRAWN AEROBIC AND  ANAEROBIC Blood Culture results may not be optimal due to an excessive volume of blood received in culture bottles   Culture   Final    NO GROWTH 5 DAYS Performed at [facility], [address] [place], [place] [address]    Report Status 01/02/2021 FINAL  Final    Coagulation Studies: No results for input(s): LABPROT, INR in the last 72 hours.   Urinalysis: No results for input(s): COLORURINE, LABSPEC, PHURINE, GLUCOSEU, HGBUR, BILIRUBINUR, KETONESUR, PROTEINUR, UROBILINOGEN, NITRITE, LEUKOCYTESUR in the last 72 hours.  Invalid input(s): APPERANCEUR    Imaging: DG Chest 2 View  Result Date: 01/01/2021 CLINICAL DATA:  Generalized weakness, anemia, shortness of breath, CHF EXAM: CHEST - 2 VIEW COMPARISON:  12/30/2020 FINDINGS: Enlargement of cardiac silhouette post CABG. Pulmonary vascular congestion. Scattered interstitial infiltrates most likely representing pulmonary edema and failure. Small  bibasilar pleural effusions and basilar atelectasis. Bones demineralized. No pneumothorax. IMPRESSION: CHF with small bibasilar pleural effusions and basilar atelectasis. Electronically Signed   By: Lavonia Dana M.D.   On: 01/01/2021 15:56   ECHOCARDIOGRAM COMPLETE  Result Date: 01/01/2021    ECHOCARDIOGRAM REPORT   Patient Name:   LITTLE WINTON Underhill Date of Exam: 01/01/2021 Medical Rec #:  [id number]        Height:       67.0 in Accession #:    [id number]       Weight:       165.3 lb Date of Birth:  January 25, 1936       BSA:          1.865 m Patient Age:    4 years         BP:           114/47 mmHg Patient Gender: M                HR:           64 bpm. Exam Location:  [facility] Procedure: 2D Echo, Cardiac Doppler and Color Doppler Indications:     CHF I50.9  History:         Patient has prior history of Echocardiogram examinations, most                  recent 09/30/2017. COPD; Risk Factors:Diabetes and Hypertension.  Sonographer:     Sherrie Sport Referring Phys:  [id number] [place] Diagnosing  Phys: Neoma Laming  Sonographer Comments: Suboptimal apical window. IMPRESSIONS  1. Left ventricular ejection fraction, by estimation, is 35 to 40%. The left ventricle has moderate to severely decreased function. The left ventricle demonstrates global hypokinesis. The left ventricular internal cavity size was severely dilated. There  is mild left ventricular hypertrophy. Left ventricular diastolic parameters are consistent with Grade II diastolic dysfunction (pseudonormalization).  2. Right ventricular systolic function is moderately reduced. The right ventricular size is moderately enlarged. Moderately increased right ventricular wall thickness.  3. Left atrial size was severely dilated.  4. Right atrial size was severely dilated.  5. The pericardial effusion is circumferential. Moderate pleural effusion in the left lateral region.  6. The mitral valve is grossly normal. Mild to moderate mitral valve regurgitation.  7. Tricuspid valve regurgitation is severe.  8. The aortic valve is calcified. Aortic valve regurgitation is mild. Aortic valve sclerosis is present, with no evidence of aortic valve stenosis. FINDINGS  Left Ventricle: Left ventricular ejection fraction, by estimation, is 35 to 40%. The left ventricle has moderate to severely decreased function. The left ventricle demonstrates global hypokinesis. The left ventricular internal cavity size was severely dilated. There is mild left ventricular hypertrophy. Left ventricular diastolic parameters are consistent with Grade II diastolic dysfunction (pseudonormalization). Right Ventricle: The right ventricular size is moderately enlarged. Moderately increased right ventricular wall thickness. Right ventricular systolic function is moderately reduced. Left Atrium: Left atrial size was severely dilated. Right Atrium: Right atrial size was severely dilated. Pericardium: Trivial pericardial effusion is present. The pericardial effusion is circumferential. Mitral  Valve: The mitral valve is grossly normal. Mild to moderate mitral valve regurgitation. MV peak gradient, 9.1 mmHg. The mean mitral valve gradient is 3.0 mmHg. Tricuspid Valve: The tricuspid valve is grossly normal. Tricuspid valve regurgitation is severe. Aortic Valve: The aortic valve is calcified. Aortic valve regurgitation is mild. Aortic valve sclerosis is present, with no evidence of aortic valve stenosis. Aortic valve mean gradient measures  5.0 mmHg. Aortic valve peak gradient measures 10.2 mmHg. Aortic valve area, by VTI measures 1.39 cm. Pulmonic Valve: The pulmonic valve was grossly normal. Pulmonic valve regurgitation is mild. Aorta: The aortic root, ascending aorta and aortic arch are all structurally normal, with no evidence of dilitation or obstruction. IAS/Shunts: The interatrial septum was not well visualized. Additional Comments: There is a moderate pleural effusion in the left lateral region.  LEFT VENTRICLE PLAX 2D LVIDd:         5.70 cm   Diastology LVIDs:         4.10 cm   LV e' medial:    5.00 cm/s LV PW:         1.20 cm   LV E/e' medial:  22.8 LV IVS:        0.90 cm   LV e' lateral:   5.77 cm/s LVOT diam:     2.00 cm   LV E/e' lateral: 19.8 LV SV:         53 LV SV Index:   29 LVOT Area:     3.14 cm  RIGHT VENTRICLE            IVC RV Basal diam:  5.40 cm    IVC diam: 2.60 cm RV S prime:     7.51 cm/s TAPSE (M-mode): 3.8 cm LEFT ATRIUM              Index        RIGHT ATRIUM           Index LA diam:        5.10 cm  2.73 cm/m   RA Area:     27.00 cm LA Vol (A2C):   129.0 ml 69.16 ml/m  RA Volume:   92.30 ml  49.49 ml/m LA Vol (A4C):   109.0 ml 58.44 ml/m LA Biplane Vol: 121.0 ml 64.87 ml/m  AORTIC VALVE                     PULMONIC VALVE AV Area (Vmax):    1.42 cm      PV Vmax:        0.78 m/s AV Area (Vmean):   1.37 cm      PV Vmean:       51.300 cm/s AV Area (VTI):     1.39 cm      PV VTI:         0.194 m AV Vmax:           159.33 cm/s   PV Peak grad:   2.4 mmHg AV Vmean:           105.667 cm/s  PV Mean grad:   1.0 mmHg AV VTI:            0.385 m       RVOT Peak grad: 5 mmHg AV Peak Grad:      10.2 mmHg AV Mean Grad:      5.0 mmHg LVOT Vmax:         71.80 cm/s LVOT Vmean:        46.100 cm/s LVOT VTI:          0.170 m LVOT/AV VTI ratio: 0.44  AORTA Ao Root diam: 3.60 cm MITRAL VALVE                TRICUSPID VALVE MV Area (PHT): 4.12 cm     TR Peak grad:   65.0 mmHg MV Area VTI:  1.34 cm     TR Vmax:        403.00 cm/s MV Peak grad:  9.1 mmHg MV Mean grad:  3.0 mmHg     SHUNTS MV Vmax:       1.51 m/s     Systemic VTI:  0.17 m MV Vmean:      79.8 cm/s    Systemic Diam: 2.00 cm MV Decel Time: 184 msec     Pulmonic VTI:  0.288 m MV E velocity: 114.00 cm/s MV A velocity: 42.40 cm/s MV E/A ratio:  2.69 Shaukat Khan Electronically signed by Neoma Laming Signature Date/Time: 01/01/2021/12:20:15 PM    Final    CT Angio Abd/Pel w/ and/or w/o  Result Date: 12/31/2020 CLINICAL DATA:  Post abdominal aortic aneurysm stenting; end-stage renal disease, missed dialysis recently, third-spacing EXAM: CTA ABDOMEN AND PELVIS WITHOUT AND WITH CONTRAST TECHNIQUE: Multidetector CT imaging of the abdomen and pelvis was performed using the standard protocol during bolus administration of intravenous contrast. Multiplanar reconstructed images and MIPs were obtained and reviewed to evaluate the vascular anatomy. CONTRAST:  122mL OMNIPAQUE IOHEXOL 350 MG/ML SOLN IV COMPARISON:  09/29/2017 FINDINGS: VASCULAR Aorta: Scattered atherosclerotic calcifications aorta. Aneurysmal dilatation distal abdominal aorta 5.4 x 5.4 cm series 12 image 32 previously 5.3 x 5.3 cm, extending 9.0 cm length previously 8.6 cm. Post placement of aortic stent with iliac extending from proximal margin of origin of SMA to distal common iliac arteries. Expected enhancement of aortic lumen and the lumens of the stents. No abnormal enhancement within native aneurysm to suggest endoleak. Celiac: Significant atherosclerotic plaque at origin of  celiac artery, with mild narrowing. SMA: Significant plaque at proximal SMA extending throughout SMA with moderate narrowing. The endoluminal stent extends across the orifice of the SMA though normal enhancement of the SMA lumen is seen. Significant plaque at the origins of atrophic renal arteries bilaterally. Renals: Significant plaque at the origins of atrophic renal arteries bilaterally. IMA: Occluded at origin.  Reconstitutes by collaterals. Inflow: Scattered atherosclerotic plaques within the common, internal common external iliac arteries bilaterally. LEFT common iliac artery and stent 2.0 cm diameter. RIGHT common iliac artery and stent 2.0 cm diameter. Proximal Outflow: Atherosclerotic plaque formation at common femoral and proximal superficial femoral arteries, suspect significant narrowing at the proximal SFA bilaterally. Veins: Patent Review of the MIP images confirms the above findings. NON-VASCULAR Lower chest: BILATERAL pleural effusions in compressive atelectasis of the adjacent lower lobes. Interstitial thickening question edema at the lower lungs bilaterally. Enlargement of cardiac chambers. Hepatobiliary: Gallbladder contracted containing multiple calcified stones. Liver unremarkable. Pancreas: Normal appearance Spleen: Normal appearance Adrenals/Urinary Tract: Adrenal glands normal appearance. Markedly atrophic BILATERAL kidneys which contain tiny calculi versus renal vascular calcifications. No mass or hydronephrosis. Ureters and decompressed bladder unremarkable. Stomach/Bowel: Sigmoid diverticulosis without evidence of diverticulitis. Stomach and bowel loops normal appearance. Appendix not identified. Lymphatic: No adenopathy Reproductive: Unremarkable prostate gland and seminal vesicles Other: Scattered ascites, low attenuation. No free air. RIGHT inguinal hernia containing ascites and minimal fat. Musculoskeletal: Unremarkable IMPRESSION: Post stenting of abdominal aortic aneurysm with 5.5 cm  diameter infrarenal abdominal aortic aneurysm, previously 5.3 x 5.3 cm, without evidence of endoleak. Stent extends across the SMA orifice though the SMA enhances normally; position of stent is unchanged from prior exam. Significant plaque at the origins of the celiac, superior mesenteric and atrophic renal arteries with moderate SMA stenosis and mild celiac artery stenosis. Cholelithiasis. Marked BILATERAL renal atrophy. Significant ascites likely due to known end-stage renal disease and  missing dialysis. Sigmoid diverticulosis without evidence of diverticulitis. RIGHT inguinal hernia containing ascites and minimal fat. Enlargement of cardiac chambers with small BILATERAL pleural effusions and minimal pulmonary edema at the lower lungs. Aortic Atherosclerosis (ICD10-I70.0). Electronically Signed   By: Lavonia Dana M.D.   On: 12/31/2020 18:55     Medications:      atorvastatin  80 mg Oral Daily   Chlorhexidine Gluconate Cloth  6 each Topical Q0600   dicyclomine  10 mg Oral TID AC & HS   epoetin (EPOGEN/PROCRIT) injection  4,000 Units Intravenous Q T,Th,Sa-HD   escitalopram  20 mg Oral Daily   ipratropium-albuterol  3 mL Nebulization Q6H   loperamide  2 mg Oral Once   loratadine  10 mg Oral Daily   midodrine  5 mg Oral TID WC   multivitamin  1 tablet Oral QHS   pantoprazole (PROTONIX) IV  40 mg Intravenous Q12H   rOPINIRole  1.5 mg Oral QHS   tamsulosin  0.8 mg Oral Daily   acetaminophen **OR** acetaminophen, fluticasone, melatonin, ondansetron **OR** ondansetron (ZOFRAN) IV  Assessment/ Plan:  85 y.o. male with   end stage renal disease on hemodialysis, hypotension, coronary artery disease status post CABG, hyperlipidemia, BPH, congestive heart failure, COPD, AAA status post stent, peptic ulcer disease was admitted on 12/10/2020 for  Principal Problem:   GI bleeding Active Problems:   Chronic combined systolic and diastolic heart failure ([name])   H/O coronary artery bypass surgery   Type  II diabetes mellitus with renal manifestations ([name])   ESRD (end stage renal disease) ([name])   Anemia in ESRD (end-stage renal disease) ([name])   CAD (coronary artery disease)   Hypotension   Thrombocytopenia ([name])   H/O Clostridium difficile infection   Aorto-biiliac stent graft ([place])   PVD (peripheral vascular disease) ([place])   AAA (abdominal aortic aneurysm) without rupture  GI bleeding [K92.2]  [facility] TTS [facility] [address] Left AVF 72kg  #. ESRD Received dialysis yesterday, tolerated well with UF of 0.  Next treatment scheduled for Tuesday.  #. Anemia of CKD and GI blood loss.  Lab Results  Component Value Date   HGB 7.7 (L) 01/02/2021   Continue low-dose EPO with dialysis treatments.  #. Secondary hyperparathyroidism of renal origin N 25.81   No results found for: PTH Lab Results  Component Value Date   PHOS 1.2 (L) 01/02/2021   Monitor calcium and phos level during this admission Patient and nursing report improved appetite.  Phosphorus should improve with appetite  # Hypokalemia  Lab Results  Component Value Date   K 3.5 01/02/2021    Potassium within range, will continue to monitor   LOS: [place] 11/27/20223:28 PM  [place], [place]

## 2021-01-03 DIAGNOSIS — D638 Anemia in other chronic diseases classified elsewhere: Secondary | ICD-10-CM

## 2021-01-03 DIAGNOSIS — R531 Weakness: Secondary | ICD-10-CM

## 2021-01-03 DIAGNOSIS — J449 Chronic obstructive pulmonary disease, unspecified: Secondary | ICD-10-CM

## 2021-01-03 DIAGNOSIS — K921 Melena: Secondary | ICD-10-CM | POA: Diagnosis not present

## 2021-01-03 DIAGNOSIS — R197 Diarrhea, unspecified: Secondary | ICD-10-CM | POA: Diagnosis not present

## 2021-01-03 DIAGNOSIS — I959 Hypotension, unspecified: Secondary | ICD-10-CM | POA: Diagnosis not present

## 2021-01-03 LAB — RENAL FUNCTION PANEL
Albumin: 2 g/dL — ABNORMAL LOW (ref 3.5–5.0)
Anion gap: 5 (ref 5–15)
BUN: 18 mg/dL (ref 8–23)
CO2: 29 mmol/L (ref 22–32)
Calcium: 7.5 mg/dL — ABNORMAL LOW (ref 8.9–10.3)
Chloride: 98 mmol/L (ref 98–111)
Creatinine, Ser: 4.32 mg/dL — ABNORMAL HIGH (ref 0.61–1.24)
GFR, Estimated: 13 mL/min — ABNORMAL LOW (ref >60)
Glucose, Bld: 88 mg/dL (ref 70–99)
Phosphorus: 1.4 mg/dL — ABNORMAL LOW (ref 2.5–4.6)
Potassium: 3.6 mmol/L (ref 3.5–5.1)
Sodium: 132 mmol/L — ABNORMAL LOW (ref 135–145)

## 2021-01-03 LAB — CBC WITH DIFFERENTIAL/PLATELET
Abs Immature Granulocytes: 0.01 10*3/uL (ref 0.00–0.07)
Basophils Absolute: 0 10*3/uL (ref 0.0–0.1)
Basophils Relative: 1 %
Eosinophils Absolute: 0.1 10*3/uL (ref 0.0–0.5)
Eosinophils Relative: 2 %
HCT: 23.5 % — ABNORMAL LOW (ref 39.0–52.0)
Hemoglobin: 7.7 g/dL — ABNORMAL LOW (ref 13.0–17.0)
Immature Granulocytes: 0 %
Lymphocytes Relative: 11 %
Lymphs Abs: 0.5 10*3/uL — ABNORMAL LOW (ref 0.7–4.0)
MCH: 31.7 pg (ref 26.0–34.0)
MCHC: 32.8 g/dL (ref 30.0–36.0)
MCV: 96.7 fL (ref 80.0–100.0)
Monocytes Absolute: 0.5 10*3/uL (ref 0.1–1.0)
Monocytes Relative: 11 %
Neutro Abs: 3.5 10*3/uL (ref 1.7–7.7)
Neutrophils Relative %: 75 %
Platelets: 60 10*3/uL — ABNORMAL LOW (ref 150–400)
RBC: 2.43 MIL/uL — ABNORMAL LOW (ref 4.22–5.81)
RDW: 19.9 % — ABNORMAL HIGH (ref 11.5–15.5)
WBC: 4.7 10*3/uL (ref 4.0–10.5)
nRBC: 0 % (ref 0.0–0.2)

## 2021-01-03 LAB — HEPATITIS C ANTIBODY: HCV Ab: NONREACTIVE

## 2021-01-03 LAB — IMMATURE PLATELET FRACTION: Immature Platelet Fraction: 5.5 % (ref 1.2–8.6)

## 2021-01-03 LAB — PATHOLOGIST SMEAR REVIEW

## 2021-01-03 MED ORDER — HALOPERIDOL LACTATE 5 MG/ML IJ SOLN
1.0000 mg | Freq: Four times a day (QID) | INTRAMUSCULAR | Status: DC | PRN
  Administered 2021-01-03 – 2021-01-05 (×2): 1 mg via INTRAMUSCULAR
  Filled 2021-01-03 (×3): qty 1

## 2021-01-03 MED ORDER — PANTOPRAZOLE SODIUM 40 MG PO TBEC
40.0000 mg | DELAYED_RELEASE_TABLET | Freq: Two times a day (BID) | ORAL | Status: DC
  Administered 2021-01-03 – 2021-01-05 (×3): 40 mg via ORAL
  Filled 2021-01-03 (×5): qty 1

## 2021-01-03 MED ORDER — K PHOS MONO-SOD PHOS DI & MONO 155-852-130 MG PO TABS
500.0000 mg | ORAL_TABLET | Freq: Once | ORAL | Status: AC
  Administered 2021-01-03: 13:00:00 500 mg via ORAL
  Filled 2021-01-03: qty 2

## 2021-01-03 NOTE — Progress Notes (Signed)
Date of Admission:  12/12/2020    ID: Janine Ores [place] is a 85 y.o. male with   Principal Problem:   GI bleeding Active Problems:   Chronic combined systolic and diastolic heart failure ([name])   Hyperlipidemia   H/O coronary artery bypass surgery   Type II diabetes mellitus with renal manifestations ([name])   ESRD (end stage renal disease) ([name])   Pancytopenia ([name])   CAD (coronary artery disease)   Hypotension   Thrombocytopenia ([name])   H/O Clostridium difficile infection   Aorto-biiliac stent graft ([name])   PVD (peripheral vascular disease) ([name])   AAA (abdominal aortic aneurysm) without rupture   Diarrhea   Anemia of chronic disease   Chronic obstructive pulmonary disease ([name])   Weakness    Subjective: Doing better No diarrhea No black tarry stool No pain abdomen   Medications:   Chlorhexidine Gluconate Cloth  6 each Topical Q0600   dicyclomine  10 mg Oral TID AC & HS   epoetin (EPOGEN/PROCRIT) injection  4,000 Units Intravenous Q T,Th,Sa-HD   escitalopram  20 mg Oral Daily   ipratropium-albuterol  3 mL Nebulization Q6H   loratadine  10 mg Oral Daily   midodrine  5 mg Oral TID WC   multivitamin  1 tablet Oral QHS   pantoprazole  40 mg Oral BID   rOPINIRole  1.5 mg Oral QHS   tamsulosin  0.8 mg Oral Daily    Objective: Vital signs in last 24 hours: Temp:  [97.2 F (36.2 C)-98.7 F (37.1 C)] 97.2 F (36.2 C) (11/28 1940) Pulse Rate:  [68-76] 74 (11/28 1940) Resp:  [15-18] 18 (11/28 1940) BP: (91-121)/(43-59) 121/59 (11/28 1940) SpO2:  [93 %-100 %] 100 % (11/28 2035) Weight:  [72.9 kg] 72.9 kg (11/28 0719)  PHYSICAL EXAM:  General: Alert, cooperative, no distress,  Head: Normocephalic, without obvious abnormality, atraumatic. Bruising over face Eyes: Conjunctivae clear, anicteric sclerae. Pupils are equal ENT Nares normal. No drainage or sinus tenderness. Lips, mucosa, and tongue normal. No Thrush Neck: Supple, symmetrical, no adenopathy, thyroid: non  tender no carotid bruit and no JVD. Back: No CVA tenderness. Lungs: Clear to auscultation bilaterally. No Wheezing or Rhonchi. No rales. Heart: Regular rate and rhythm, no murmur, rub or gallop. Abdomen: Soft, non-tender,not distended. Bowel sounds normal. No masses Extremities: atraumatic, no cyanosis. No edema. No clubbing Skin: No rashes or lesions. Or bruising Lymph: Cervical, supraclavicular normal. Neurologic: Grossly non-focal  Lab Results Recent Labs    01/02/21 0344 01/03/21 0505  WBC 4.8 4.7  HGB 7.7* 7.7*  HCT 23.7* 23.5*  NA 133* 132*  K 3.5 3.6  CL 98 98  CO2 31 29  BUN 11 18  CREATININE 3.06* 4.32*   Liver Panel Recent Labs    01/01/21 1523 01/02/21 0344 01/03/21 0505  PROT 5.7*  --   --   ALBUMIN 2.1* 1.8* 2.0*  AST 34  --   --   ALT 13  --   --   ALKPHOS 108  --   --   BILITOT 1.5*  --   --      Assessment/Plan: Pt presenting with weakness, h/o falls and black tarry stool   GI bleed- melena stool- this is ongoing for 6 weeks- Cdiff does not present with GI bleed- Need to r/o ischemic colitis/ mesenteric ischemia- gastritis There is no need to test for Cdiff as even if present is not the cause for his symptoms- HE had toxin neg/antigen positive/PCR positive cdiff test  in oct. 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   Significant plaque at the origins of the celiac, superior mesenteric and atrophic renal arteries with moderate SMA stenosis and mild celiac artery stenosis.   Anemia- has received PRBC Thrombocytopenia R/o MDS   Frequent falls-  orthostasis   Arterial disease   ESRD due to b/l renal artery  atherosclerosis   CAD-S/P CABG ? HTN ___________________________________________________ Discussed with patient,and  hospitlalist ID will sign off- call if needed

## 2021-01-03 NOTE — Progress Notes (Signed)
Patient ID: Brent Glover, male   DOB: September 17, 1935, 85 y.o.   MRN: [id number] [facility] PROGRESS NOTE  Brent Glover KDX:[id number] DOB: 1936/01/31 DOA: 12/31/2020 PCP: Christen Bame, DO  HPI/Subjective: Patient has a little bit of a cough.  No abdominal pain.  Had 2 bowel movements this morning.  Patient states that diarrhea but they were listed as stage II stools.  Came in with weakness and falls.  Objective: Vitals:   01/03/21 1313 01/03/21 1434  BP:  (!) 102/59  Pulse:  68  Resp:  17  Temp:  97.9 F (36.6 C)  SpO2: 93% 93%    Intake/Output Summary (Last 24 hours) at 01/03/2021 1548 Last data filed at 01/03/2021 1330 Gross per 24 hour  Intake 840 ml  Output 5 ml  Net 835 ml   Filed Weights   12/30/20 1500 01/01/21 0940 01/03/21 0719  Weight: 75 kg 70.9 kg 72.9 kg    ROS: Review of Systems  Respiratory:  Negative for shortness of breath.   Cardiovascular:  Negative for chest pain.  Gastrointestinal:  Negative for abdominal pain, nausea and vomiting.  Exam: Physical Exam HENT:     Head: Normocephalic.     Mouth/Throat:     Pharynx: No oropharyngeal exudate.  Eyes:     General: Lids are normal.     Conjunctiva/sclera: Conjunctivae normal.  Cardiovascular:     Rate and Rhythm: Normal rate and regular rhythm.     Heart sounds: Normal heart sounds, S1 normal and S2 normal.  Pulmonary:     Breath sounds: Examination of the right-lower field reveals decreased breath sounds. Examination of the left-lower field reveals decreased breath sounds. Decreased breath sounds present. No wheezing, rhonchi or rales.  Abdominal:     Palpations: Abdomen is soft.     Tenderness: There is no abdominal tenderness.  Musculoskeletal:     Right lower leg: No swelling.     Left lower leg: No swelling.  Skin:    General: Skin is warm.     Findings: No rash.  Neurological:     Mental Status: He is alert.      Scheduled Meds:  atorvastatin  80 mg Oral Daily    Chlorhexidine Gluconate Cloth  6 each Topical Q0600   dicyclomine  10 mg Oral TID AC & HS   epoetin (EPOGEN/PROCRIT) injection  4,000 Units Intravenous Q T,Th,Sa-HD   escitalopram  20 mg Oral Daily   ipratropium-albuterol  3 mL Nebulization Q6H   loratadine  10 mg Oral Daily   midodrine  5 mg Oral TID WC   multivitamin  1 tablet Oral QHS   pantoprazole  40 mg Oral BID   rOPINIRole  1.5 mg Oral QHS   tamsulosin  0.8 mg Oral Daily     Assessment/Plan:  Diarrhea.  2 bowel movements stage II stools today.  Trial of Bentyl.  Had recent C. difficile infection. GI bleed.  Patient received a unit of packed red blood cells during hospital course.  I will give another unit of blood with dialysis tomorrow. Hypotension on midodrine End-stage renal disease on dialysis Anemia of chronic disease.  Also has some blood loss.  Last hemoglobin 7.7.  We will transfuse 1 unit of packed red blood cells with dialysis tomorrow. Pancytopenia.  Peripheral smear looks okay.  On Epogen for anemia.  Hepatitis C pending.  Will refer to hematology as outpatient. Hyperlipidemia unspecified.  With weakness I will get rid of atorvastatin. COPD with some  congestion.  Continue nebulizers. Weakness.  Physical therapy recommending home health.        Code Status:     Code Status Orders  (From admission, onward)           Start     Ordered   12/29/20 0045  Full code  Continuous        12/29/20 0049           Code Status History     Date Active Date Inactive Code Status Order ID Comments User Context   11/11/2020 2026 11/15/2020 1924 Full Code [id number]  Ivor Costa, MD ED   09/29/2017 1525 10/03/2017 1927 Full Code [id number]  Dustin Flock, MD Inpatient      Family Communication: Spoke to patient's wife on the phone at [phone] Disposition Plan: Status is: Inpatient  Time spent: 27 minutes  [place]

## 2021-01-03 NOTE — Progress Notes (Signed)
Physical Therapy Treatment Patient Details Name: Brent Glover MRN: [id number] DOB: 03/15/1935 Today's Date: 01/03/2021   History of Present Illness Pt admitted to [facility] on 12/25/2020 for c/o weakness and dark stool, found to have symptomatic anemia and dehydration. GI consulted due to concern for GI bleed as pt was recently admitted for GI bleed and anemia. Per GI consult, no intervention needed. PMH significant for: chronic diastolic CHF, COPD, I7OM, CAD s/p CABG, ESRD (HD), HTN, dyslipidemia. Imaging revealed increased vascular congestion without interstitial pulmonary edema.   PT Comments    Pt was pleasant and motivated to participate during the session and put forth good effort throughout. Pt was able to complete all ther ex in bed with simple commands. Pt was able to complete bed mobility with supervision but required increased time and effort and min cuing for hand placement. Pt attempted to STS from lowest bed setting but required mod assist to complete. The bed was raised a few inches and min cuing for hand placement was given by PT and pt was able to STS with min assist. Pt was able to ambulate in the room with min guard for safety and a rest break between walks to check vitals. SpO2 remained in the low to mid 90s and HR remained in the mid to upper 70s the entire session. PT will attempt stair training next session. Pt will benefit from HHPT upon discharge to safely address deficits listed in patient problem list for decreased caregiver assistance and eventual return to PLOF.   Recommendations for follow up therapy are one component of a multi-disciplinary discharge planning process, led by the attending physician.  Recommendations may be updated based on patient status, additional functional criteria and insurance authorization.  Follow Up Recommendations  Home health PT     Assistance Recommended at Discharge Intermittent Supervision/Assistance  Equipment Recommendations  None  recommended by PT    Recommendations for Other Services       Precautions / Restrictions Precautions Precautions: Fall Precaution Comments: O2 >/= 92% Restrictions Weight Bearing Restrictions: No     Mobility  Bed Mobility Overal bed mobility: Modified Independent Bed Mobility: Supine to Sit     Supine to sit: Supervision;HOB elevated     General bed mobility comments: increased time/effort with BUE support on handrails    Transfers Overall transfer level: Needs assistance Equipment used: Rolling walker (2 wheels) Transfers: Sit to/from Stand Sit to Stand: Min assist;Mod assist           General transfer comment: Increased time and effort, mod assit at EOB from lowest setting, min assist at EOB from elevated setting, min cuing for hand placement    Ambulation/Gait Ambulation/Gait assistance: Min guard Gait Distance (Feet): 20 Feet x2 Assistive device: Rolling walker (2 wheels) Gait Pattern/deviations: Step-through pattern;Decreased step length - right;Decreased step length - left;Decreased stride length Gait velocity: decreased     General Gait Details: decreased knee ext BLE, decreased RW proximity, decreased step length/foot clearance BLE, forward flexed posture, and slowed cadence. Verbal cues for RW proximity, rest break in between walking to check vitals   Stairs             Wheelchair Mobility    Modified Rankin (Stroke Patients Only)       Balance Overall balance assessment: Needs assistance Sitting-balance support: No upper extremity supported;Feet supported Sitting balance-Leahy Scale: Fair Sitting balance - Comments: Posterior lean noted when performing LAQs at EOB Postural control: Posterior lean Standing balance support: Bilateral upper extremity  supported;During functional activity Standing balance-Leahy Scale: Fair Standing balance comment: in RW due to decreased RW proximity and decreased bil knee ext                             Cognition Arousal/Alertness: Awake/alert Behavior During Therapy: WFL for tasks assessed/performed Overall Cognitive Status: Within Functional Limits for tasks assessed                                          Exercises Total Joint Exercises Ankle Circles/Pumps: AROM;Strengthening;Both;10 reps;Supine Quad Sets: AROM;Strengthening;Both;10 reps;Supine Long Arc Quad: AROM;Strengthening;Both;10 reps;Seated    General Comments        Pertinent Vitals/Pain Pain Assessment: No/denies pain    Home Living                          Prior Function            PT Goals (current goals can now be found in the care plan section) Acute Rehab PT Goals Patient Stated Goal: "go home" PT Goal Formulation: With patient Time For Goal Achievement: 01/14/21 Potential to Achieve Goals: Good Progress towards PT goals: Progressing toward goals    Frequency    Min 2X/week      PT Plan Current plan remains appropriate    Co-evaluation              AM-PAC PT "6 Clicks" Mobility   Outcome Measure  Help needed turning from your back to your side while in a flat bed without using bedrails?: A Little Help needed moving from lying on your back to sitting on the side of a flat bed without using bedrails?: A Little Help needed moving to and from a bed to a chair (including a wheelchair)?: A Little Help needed standing up from a chair using your arms (e.g., wheelchair or bedside chair)?: A Lot Help needed to walk in hospital room?: A Little Help needed climbing 3-5 steps with a railing? : A Little 6 Click Score: 17    End of Session Equipment Utilized During Treatment: Gait belt Activity Tolerance: Patient tolerated treatment well Patient left: in chair;with call bell/phone within reach;with chair alarm set Nurse Communication: Mobility status PT Visit Diagnosis: Unsteadiness on feet (R26.81);History of falling (Z91.81);Other abnormalities of gait  and mobility (R26.89);Muscle weakness (generalized) (M62.81)     Time: 0932-6712 PT Time Calculation (min) (ACUTE ONLY): 32 min  Charges:                        Sheldon Silvan SPT 01/03/21, 4:24 PM

## 2021-01-03 NOTE — Progress Notes (Addendum)
[facility], [place] 01/03/21  Subjective:   LOS: 5  Patient known to our practice from previous admissions. This time he is admitted for generalized weakness, loose stools which are dark in color.    Patient resting in bed, alert and oriented Tolerating meals without nausea and vomiting Denies shortness of breath Diarrhea improving   Objective:  Vital signs in last 24 hours:  Temp:  [97.7 F (36.5 C)-98.7 F (37.1 C)] 98 F (36.7 C) (11/28 1113) Pulse Rate:  [64-76] 69 (11/28 1113) Resp:  [15-20] 17 (11/28 1113) BP: (91-121)/(43-63) 91/43 (11/28 1113) SpO2:  [93 %-100 %] 93 % (11/28 1313) Weight:  [72.9 kg] 72.9 kg (11/28 0719)  Weight change:  Filed Weights   12/30/20 1500 01/01/21 0940 01/03/21 0719  Weight: 75 kg 70.9 kg 72.9 kg    Intake/Output:    Intake/Output Summary (Last 24 hours) at 01/03/2021 1322 Last data filed at 01/03/2021 0720 Gross per 24 hour  Intake 240 ml  Output 4 ml  Net 236 ml      Physical Exam: General: Frail, elderly gentleman, laying in the bed  HEENT Anicteric, hearing intact.  Pulm/lungs Normal breathing effort, clear bilateral  CVS/Heart No rub or gallop  Abdomen:  soft, nontender  Extremities: No edema  Neurologic: Alert, oriented  Skin: No acute rashes  Access: Left forearm AV fistula       Basic Metabolic Panel:  Recent Labs  Lab 12/30/20 0611 12/31/20 0927 01/01/21 1523 01/02/21 0344 01/03/21 0505  NA 138 134* 131* 133* 132*  K 3.5 3.7 3.3* 3.5 3.6  CL 100 97* 96* 98 98  CO2 32 32 32 31 29  GLUCOSE 77 105* 89 114* 88  BUN 31* 17 9 11 18   CREATININE 4.55* 3.44* 2.30* 3.06* 4.32*  CALCIUM 7.7* 7.7* 7.4* 7.6* 7.5*  PHOS 2.8 1.7*  --  1.2* 1.4*      CBC: Recent Labs  Lab 12/30/20 0611 12/31/20 0927 01/01/21 1523 01/02/21 0344 01/03/21 0505  WBC 3.4* 3.6* 3.7* 4.8 4.7  NEUTROABS 2.3 2.7 2.7 3.6 3.5  HGB 8.2* 7.9* 8.3* 7.7* 7.7*  HCT 25.0* 24.3* 25.3* 23.7* 23.5*  MCV  95.8 96.8 98.4 97.1 96.7  PLT 52* 55* 56* 59* 60*       Lab Results  Component Value Date   HEPBSAG NON REACTIVE 12/29/2020   HEPBSAB Reactive (A) 11/13/2020      Microbiology:  Recent Results (from the past 240 hour(s))  Resp Panel by RT-PCR (Flu A&B, Covid) Nasopharyngeal Swab     Status: None   Collection Time: 12/23/2020 11:08 PM   Specimen: Nasopharyngeal Swab; Nasopharyngeal(NP) swabs in vial transport medium  Result Value Ref Range Status   SARS Coronavirus 2 by RT PCR NEGATIVE NEGATIVE Final    Comment: (NOTE) SARS-CoV-2 target nucleic acids are NOT DETECTED.  The SARS-CoV-2 RNA is generally detectable in upper respiratory specimens during the acute phase of infection. The lowest concentration of SARS-CoV-2 viral copies this assay can detect is 138 copies/mL. A negative result does not preclude SARS-Cov-2 infection and should not be used as the sole basis for treatment or other patient management decisions. A negative result may occur with  improper specimen collection/handling, submission of specimen other than nasopharyngeal swab, presence of viral mutation(s) within the areas targeted by this assay, and inadequate number of viral copies(<138 copies/mL). A negative result must be combined with clinical observations, patient history, and epidemiological information. The expected result is Negative.  Fact  Sheet for Patients:  EntrepreneurPulse.com.au  Fact Sheet for Healthcare Providers:  IncredibleEmployment.be  This test is no t yet approved or cleared by the [place] FDA and  has been authorized for detection and/or diagnosis of SARS-CoV-2 by FDA under an Emergency Use Authorization (EUA). This EUA will remain  in effect (meaning this test can be used) for the duration of the COVID-19 declaration under Section 564(b)(1) of the Act, 21 U.S.C.section 360bbb-3(b)(1), unless the authorization is terminated  or revoked  sooner.       Influenza A by PCR NEGATIVE NEGATIVE Final   Influenza B by PCR NEGATIVE NEGATIVE Final    Comment: (NOTE) The Xpert Xpress SARS-CoV-2/FLU/RSV plus assay is intended as an aid in the diagnosis of influenza from Nasopharyngeal swab specimens and should not be used as a sole basis for treatment. Nasal washings and aspirates are unacceptable for Xpert Xpress SARS-CoV-2/FLU/RSV testing.  Fact Sheet for Patients: EntrepreneurPulse.com.au  Fact Sheet for Healthcare Providers: IncredibleEmployment.be  This test is not yet approved or cleared by the [place] FDA and has been authorized for detection and/or diagnosis of SARS-CoV-2 by FDA under an Emergency Use Authorization (EUA). This EUA will remain in effect (meaning this test can be used) for the duration of the COVID-19 declaration under Section 564(b)(1) of the Act, 21 U.S.C. section 360bbb-3(b)(1), unless the authorization is terminated or revoked.  Performed at [facility], [address] [place], [place] [address]   Blood Culture (routine x 2)     Status: None   Collection Time: 12/25/2020 11:09 PM   Specimen: BLOOD LEFT FOREARM  Result Value Ref Range Status   Specimen Description BLOOD LEFT FOREARM  Final   Special Requests   Final    BOTTLES DRAWN AEROBIC AND ANAEROBIC Blood Culture adequate volume   Culture   Final    NO GROWTH 5 DAYS Performed at [facility], [address], [place], [place] [address]    Report Status 01/02/2021 FINAL  Final  Blood Culture (routine x 2)     Status: None   Collection Time: 12/07/2020 11:09 PM   Specimen: BLOOD RIGHT FOREARM  Result Value Ref Range Status   Specimen Description BLOOD RIGHT FOREARM  Final   Special Requests   Final    BOTTLES DRAWN AEROBIC AND ANAEROBIC Blood Culture results may not be optimal due to an excessive volume of blood received in culture bottles   Culture   Final    NO GROWTH 5  DAYS Performed at [facility], [address] [place], [place] [address]    Report Status 01/02/2021 FINAL  Final    Coagulation Studies: No results for input(s): LABPROT, INR in the last 72 hours.   Urinalysis: No results for input(s): COLORURINE, LABSPEC, PHURINE, GLUCOSEU, HGBUR, BILIRUBINUR, KETONESUR, PROTEINUR, UROBILINOGEN, NITRITE, LEUKOCYTESUR in the last 72 hours.  Invalid input(s): APPERANCEUR    Imaging: DG Chest 2 View  Result Date: 01/01/2021 CLINICAL DATA:  Generalized weakness, anemia, shortness of breath, CHF EXAM: CHEST - 2 VIEW COMPARISON:  12/30/2020 FINDINGS: Enlargement of cardiac silhouette post CABG. Pulmonary vascular congestion. Scattered interstitial infiltrates most likely representing pulmonary edema and failure. Small bibasilar pleural effusions and basilar atelectasis. Bones demineralized. No pneumothorax. IMPRESSION: CHF with small bibasilar pleural effusions and basilar atelectasis. Electronically Signed   By: Lavonia Dana M.D.   On: 01/01/2021 15:56     Medications:      atorvastatin  80 mg Oral Daily   Chlorhexidine Gluconate Cloth  6 each Topical Q0600   dicyclomine  10 mg Oral TID AC & HS   epoetin (EPOGEN/PROCRIT) injection  4,000 Units Intravenous Q T,Th,Sa-HD   escitalopram  20 mg Oral Daily   ipratropium-albuterol  3 mL Nebulization Q6H   loratadine  10 mg Oral Daily   midodrine  5 mg Oral TID WC   multivitamin  1 tablet Oral QHS   pantoprazole  40 mg Oral BID   rOPINIRole  1.5 mg Oral QHS   tamsulosin  0.8 mg Oral Daily   acetaminophen **OR** acetaminophen, fluticasone, haloperidol lactate, melatonin, ondansetron **OR** ondansetron (ZOFRAN) IV  Assessment/ Plan:  85 y.o. male with   end stage renal disease on hemodialysis, hypotension, coronary artery disease status post CABG, hyperlipidemia, BPH, congestive heart failure, COPD, AAA status post stent, peptic ulcer disease was admitted on 12/26/2020 for  Principal  Problem:   GI bleeding Active Problems:   Chronic combined systolic and diastolic heart failure ([name])   Hyperlipidemia   H/O coronary artery bypass surgery   Type II diabetes mellitus with renal manifestations ([name])   ESRD (end stage renal disease) ([name])   Pancytopenia ([name])   CAD (coronary artery disease)   Hypotension   Thrombocytopenia ([name])   H/O Clostridium difficile infection   Aorto-biiliac stent graft ([place])   PVD (peripheral vascular disease) ([place])   AAA (abdominal aortic aneurysm) without rupture   Diarrhea  GI bleeding [K92.2]  [facility] Nephrology TTS [facility] [address] Left AVF 72kg  #. ESRD Next treatment scheduled for Tuesday.  #. Anemia of CKD and GI blood loss.  Lab Results  Component Value Date   HGB 7.7 (L) 01/03/2021  Hemoglobin remains below target.  Primary team plans to transfuse 1 unit with dialysis tomorrow Low-dose EPO with dialysis treatments.  #. Secondary hyperparathyroidism of renal origin N 25.81   No results found for: PTH Lab Results  Component Value Date   PHOS 1.4 (L) 01/03/2021   Phosphorus should improve as appetite improves.  We will continue to monitor  # Hypokalemia  Lab Results  Component Value Date   K 3.6 01/03/2021     stable   LOS: 5 [employer] 11/28/20221:22 PM  [facility] [place], [place]

## 2021-01-04 DIAGNOSIS — R410 Disorientation, unspecified: Secondary | ICD-10-CM

## 2021-01-04 DIAGNOSIS — K922 Gastrointestinal hemorrhage, unspecified: Secondary | ICD-10-CM | POA: Diagnosis not present

## 2021-01-04 DIAGNOSIS — R531 Weakness: Secondary | ICD-10-CM | POA: Diagnosis not present

## 2021-01-04 DIAGNOSIS — R197 Diarrhea, unspecified: Secondary | ICD-10-CM | POA: Diagnosis not present

## 2021-01-04 LAB — CBC WITH DIFFERENTIAL/PLATELET
Abs Immature Granulocytes: 0.03 10*3/uL (ref 0.00–0.07)
Basophils Absolute: 0 10*3/uL (ref 0.0–0.1)
Basophils Relative: 1 %
Eosinophils Absolute: 0.1 10*3/uL (ref 0.0–0.5)
Eosinophils Relative: 2 %
HCT: 23.4 % — ABNORMAL LOW (ref 39.0–52.0)
Hemoglobin: 7.6 g/dL — ABNORMAL LOW (ref 13.0–17.0)
Immature Granulocytes: 1 %
Lymphocytes Relative: 10 %
Lymphs Abs: 0.5 10*3/uL — ABNORMAL LOW (ref 0.7–4.0)
MCH: 31.4 pg (ref 26.0–34.0)
MCHC: 32.5 g/dL (ref 30.0–36.0)
MCV: 96.7 fL (ref 80.0–100.0)
Monocytes Absolute: 0.5 10*3/uL (ref 0.1–1.0)
Monocytes Relative: 9 %
Neutro Abs: 3.9 10*3/uL (ref 1.7–7.7)
Neutrophils Relative %: 77 %
Platelets: 70 10*3/uL — ABNORMAL LOW (ref 150–400)
RBC: 2.42 MIL/uL — ABNORMAL LOW (ref 4.22–5.81)
RDW: 20 % — ABNORMAL HIGH (ref 11.5–15.5)
WBC: 5.1 10*3/uL (ref 4.0–10.5)
nRBC: 0 % (ref 0.0–0.2)

## 2021-01-04 LAB — RENAL FUNCTION PANEL
Albumin: 2 g/dL — ABNORMAL LOW (ref 3.5–5.0)
Anion gap: 7 (ref 5–15)
BUN: 24 mg/dL — ABNORMAL HIGH (ref 8–23)
CO2: 29 mmol/L (ref 22–32)
Calcium: 7.6 mg/dL — ABNORMAL LOW (ref 8.9–10.3)
Chloride: 96 mmol/L — ABNORMAL LOW (ref 98–111)
Creatinine, Ser: 5.49 mg/dL — ABNORMAL HIGH (ref 0.61–1.24)
GFR, Estimated: 10 mL/min — ABNORMAL LOW (ref >60)
Glucose, Bld: 100 mg/dL — ABNORMAL HIGH (ref 70–99)
Phosphorus: 1.7 mg/dL — ABNORMAL LOW (ref 2.5–4.6)
Potassium: 3.8 mmol/L (ref 3.5–5.1)
Sodium: 132 mmol/L — ABNORMAL LOW (ref 135–145)

## 2021-01-04 LAB — TYPE AND SCREEN
ABO/RH(D): O POS
Antibody Screen: NEGATIVE
Unit division: 0

## 2021-01-04 LAB — T4, FREE: Free T4: 1.2 ng/dL — ABNORMAL HIGH (ref 0.61–1.12)

## 2021-01-04 LAB — BPAM RBC
Blood Product Expiration Date: 202301022359
Unit Type and Rh: 5100

## 2021-01-04 LAB — PREPARE RBC (CROSSMATCH)

## 2021-01-04 MED ORDER — ALBUMIN HUMAN 25 % IV SOLN
25.0000 g | Freq: Once | INTRAVENOUS | Status: AC
  Administered 2021-01-04: 25 g via INTRAVENOUS

## 2021-01-04 MED ORDER — LEVOTHYROXINE SODIUM 25 MCG PO TABS: 12.5000 ug | ORAL_TABLET | Freq: Every day | ORAL | Status: DC

## 2021-01-04 MED ORDER — MIDODRINE HCL 5 MG PO TABS
10.0000 mg | ORAL_TABLET | Freq: Three times a day (TID) | ORAL | Status: DC
  Administered 2021-01-04 – 2021-01-05 (×4): 10 mg via ORAL
  Filled 2021-01-04 (×3): qty 2

## 2021-01-04 MED ORDER — MIDODRINE HCL 5 MG PO TABS
5.0000 mg | ORAL_TABLET | Freq: Once | ORAL | Status: AC
  Administered 2021-01-04: 5 mg via ORAL

## 2021-01-04 MED ORDER — MIRTAZAPINE 15 MG PO TABS
7.5000 mg | ORAL_TABLET | Freq: Every day | ORAL | Status: DC
Start: 2021-01-04 — End: 2021-01-04

## 2021-01-04 MED ORDER — ACETAMINOPHEN 325 MG PO TABS: 650.0000 mg | ORAL_TABLET | Freq: Once | ORAL | Status: DC

## 2021-01-04 MED ORDER — MIDODRINE HCL 5 MG PO TABS: 10.0000 mg | ORAL_TABLET | Freq: Three times a day (TID) | ORAL | Status: DC

## 2021-01-04 MED ORDER — OLANZAPINE 5 MG PO TABS
5.0000 mg | ORAL_TABLET | Freq: Every day | ORAL | Status: DC
  Filled 2021-01-04 (×3): qty 1

## 2021-01-04 MED ORDER — SODIUM CHLORIDE 0.9% IV SOLUTION: Freq: Once | INTRAVENOUS | Status: DC

## 2021-01-04 MED ORDER — EPOETIN ALFA 4000 UNIT/ML IJ SOLN
INTRAMUSCULAR | Status: AC
  Filled 2021-01-04: qty 1

## 2021-01-04 MED ORDER — ALBUMIN HUMAN 25 % IV SOLN
INTRAVENOUS | Status: AC
  Filled 2021-01-04: qty 100

## 2021-01-04 NOTE — Progress Notes (Signed)
Patient's BP dropped to 80/44.  Patient is very confused, impulsive and jumping out of bed.  Unable to determine if patient is having any worsening SOB, dizziness/lightheadedness and/or chest pain.  Patient has not vomited since Zofran given, so oral dose of midodrine given at this time.   Paged MD  MD increased midodrine to 10 mg  Tele-sitter ordered to monitor patient

## 2021-01-04 NOTE — Progress Notes (Signed)
Albumin 25gms given at start of dialysis treatment for blood pressure support.

## 2021-01-04 NOTE — Progress Notes (Signed)
Patient completed dialysis treatment without complications after Albumin dose for blood pressure support.Patient restless throughout treatment, pulling at dialysis lines and trying to get out of bed. Patient had to be redirected frequently.No uf, blood and priming volume removed with dialysis. 1unit of packed red blood cells and epogen dose given. Hemastasis achieved,sites dressed with gauze/taped.

## 2021-01-04 NOTE — NC FL2 (Signed)
[place] LEVEL OF CARE SCREENING TOOL     IDENTIFICATION  Patient Name: Brent Glover Birthdate: Mar 04, 1935 Sex: male Admission Date (Current Location): 12/08/2020  [facility] and [place] Number:  Engineering geologist and Address:  [facility], [address], [place], [place] [address]      Provider Number: [id number]  Attending Physician Name and Address:  Loletha Grayer, MD  Relative Name and Phone Number:       Current Level of Care: Hospital Recommended Level of Care: [place] Prior Approval Number:    Date Approved/Denied:   PASRR Number: [id number]  Discharge Plan: SNF    Current Diagnoses: Patient Active Problem List   Diagnosis Date Noted   Anemia of chronic disease    Chronic obstructive pulmonary disease ([place])    Weakness    Diarrhea    PVD (peripheral vascular disease) ([place]) 01/01/2021   AAA (abdominal aortic aneurysm) without rupture 01/01/2021   Aorto-biiliac stent graft ([place]) 12/31/2020   H/O Clostridium difficile infection 12/29/2020   GI bleeding 11/11/2020   Type II diabetes mellitus with renal manifestations ([place]) 11/11/2020   Depression 11/11/2020   ESRD (end stage renal disease) ([place])    Chronic systolic CHF (congestive heart failure) ([name])    Pancytopenia ([name])    CAD (coronary artery disease)    Elevated troponin    Hypotension    Bradycardia    Thrombocytopenia ([name])    Anemia due to blood loss    Syncope and collapse 09/29/2017   Chronic kidney disease    CAD in native artery 11/23/2014   H/O coronary artery bypass surgery 10/28/2014   Acute non-ST elevation myocardial infarction (NSTEMI) ([place]) 10/15/2014   Chronic kidney disease (CKD), stage III (moderate) ([name]) 03/17/2014   Creatinine elevation 03/11/2014   Chronic combined systolic and diastolic heart failure ([place]) 11/29/2012   Claudication ([place]) 06/25/2012   Chronic diastolic heart failure ([place])    Hypertension     Type 2 diabetes mellitus ([place]) 09/21/2011   Aneurysm ([place]) 10/07/2010   Arthritis, degenerative 09/09/2010   Hyperlipidemia 04/04/2009   Benign hypertension 09/04/2008    Orientation RESPIRATION BLADDER Height & Weight     Self, Time, Situation  Normal Continent Weight: 160 lb 11.5 oz (72.9 kg) Height:  5\' 7"  (170.2 cm)  BEHAVIORAL SYMPTOMS/MOOD NEUROLOGICAL BOWEL NUTRITION STATUS      Incontinent Diet (enal with fluid restriction Fluid restriction: 1200 mL Fluid)  AMBULATORY STATUS COMMUNICATION OF NEEDS Skin   Extensive Assist Verbally Normal                       Personal Care Assistance Level of Assistance  Bathing, Feeding, Dressing Bathing Assistance: Maximum assistance Feeding assistance: Independent Dressing Assistance: Maximum assistance     Functional Limitations Info  Sight, Hearing, Speech Sight Info: Adequate Hearing Info: Adequate Speech Info: Adequate    SPECIAL CARE FACTORS FREQUENCY  PT (By licensed PT), OT (By licensed OT)     PT Frequency: 5x OT Frequency: 5x            Contractures Contractures Info: Not present    Additional Factors Info  Code Status, Allergies Code Status Info: full code Allergies Info: Montelukast, Trazodone, Hydrocodone           Current Medications (01/04/2021):  This is the current hospital active medication list Current Facility-Administered Medications  Medication Dose Route Frequency Provider Last Rate Last Admin   0.9 %  sodium  chloride infusion (Manually program via Guardrails IV Fluids)   Intravenous Once Loletha Grayer, MD       acetaminophen (TYLENOL) tablet 650 mg  650 mg Oral Q6H PRN Mansy, Jan A, MD   650 mg at 01/03/21 0131   Or   acetaminophen (TYLENOL) suppository 650 mg  650 mg Rectal Q6H PRN Mansy, Jan A, MD       acetaminophen (TYLENOL) tablet 650 mg  650 mg Oral Once Loletha Grayer, MD       Chlorhexidine Gluconate Cloth 2 % PADS 6 each  6 each Topical Q0600 Murlean Iba, MD   6 each  at 01/04/21 0346   dicyclomine (BENTYL) capsule 10 mg  10 mg Oral TID AC & HS Loletha Grayer, MD   10 mg at 01/04/21 1138   epoetin alfa (EPOGEN) injection 4,000 Units  4,000 Units Intravenous Q T,Th,Sa-HD Murlean Iba, MD   4,000 Units at 01/01/21 1200   escitalopram (LEXAPRO) tablet 20 mg  20 mg Oral Daily Mansy, Jan A, MD   20 mg at 01/04/21 1130   fluticasone (FLONASE) 50 MCG/ACT nasal spray 2 spray  2 spray Each Nare Daily PRN Mansy, Jan A, MD       haloperidol lactate (HALDOL) injection 1 mg  1 mg Intramuscular Q6H PRN Mansy, Jan A, MD   1 mg at 01/03/21 0124   ipratropium-albuterol (DUONEB) 0.5-2.5 (3) MG/3ML nebulizer solution 3 mL  3 mL Nebulization Q6H Wieting, [place], MD   3 mL at 01/04/21 0226   loratadine (CLARITIN) tablet 10 mg  10 mg Oral Daily Mansy, Jan A, MD   10 mg at 01/04/21 1130   melatonin tablet 10 mg  10 mg Oral QHS PRN Mansy, Jan A, MD   10 mg at 01/03/21 2214   midodrine (PROAMATINE) tablet 10 mg  10 mg Oral TID WC Wieting, Jocelyn, MD       multivitamin (RENA-VIT) tablet 1 tablet  1 tablet Oral QHS Mansy, Jan A, MD   1 tablet at 01/03/21 2218   ondansetron (ZOFRAN) tablet 4 mg  4 mg Oral Q6H PRN Mansy, Jan A, MD       Or   ondansetron [facility]) injection 4 mg  4 mg Intravenous Q6H PRN Mansy, Jan A, MD   4 mg at 01/04/21 4128   pantoprazole (PROTONIX) EC tablet 40 mg  40 mg Oral BID Loletha Grayer, MD   40 mg at 01/04/21 1130   rOPINIRole (REQUIP) tablet 1.5 mg  1.5 mg Oral QHS Mansy, Jan A, MD   1.5 mg at 01/03/21 2214   tamsulosin (FLOMAX) capsule 0.8 mg  0.8 mg Oral Daily Mansy, Jan A, MD   0.8 mg at 01/04/21 1130     Discharge Medications: Please see discharge summary for a list of discharge medications.  Relevant Imaging Results:  Relevant Lab Results:   Additional Information [phone]  Eileen Stanford, LCSW

## 2021-01-04 NOTE — Plan of Care (Signed)

## 2021-01-04 NOTE — Progress Notes (Signed)
Physical Therapy Treatment Patient Details Name: Brent Glover MRN: [id number] DOB: 1935/09/16 Today's Date: 01/04/2021   History of Present Illness Pt admitted to [facility] on 12/15/2020 for c/o weakness and dark stool, found to have symptomatic anemia and dehydration. GI consulted due to concern for GI bleed as pt was recently admitted for GI bleed and anemia. Per GI consult, no intervention needed. PMH significant for: chronic diastolic CHF, COPD, N2DP, CAD s/p CABG, ESRD ([place]), HTN, dyslipidemia. Imaging revealed increased vascular congestion without interstitial pulmonary edema.   PT Comments    Pt was pleasant and motivated to participate during the session and put forth good effort throughout. Pt had difficulty providing his correct name but was able to tell PT his birthday. Pt had great difficulty following simple commands for bed exercises and could only complete at max 5 reps per exercise due to ability to pay attention and comprehend. Pt required min assist with bed mobility for proper rolling sequence. Pt required mod assist with STS from lowest bed setting, but with bed surface raised a few inches, pt required min assist for STS. Once in standing, pt demonstrated a posterior lean that required PT to hold front of RW down in order to prevent fall backwards onto bed due to lack of righting responses. Pt was able to take a few steps away from the bed with min assist for RW sequencing, but required mod assist when stepping backward to prevent fall backward onto bed due to lack of posterior lean control and righting responses. SpO2 and HR remained WNL throughout session. BP taken at end of session by nursing and BP was 80/44, so medical team was notified via nursing. PT noted significant physical and mental decline from previous session where pt was able to ambulate 40ft x2 with RW and had been oriented appropriately. Due to session today, pt will benefit from PT services in a SNF setting upon  discharge to safely address deficits listed in patient problem list for decreased caregiver assistance and eventual return to PLOF.    Recommendations for follow up therapy are one component of a multi-disciplinary discharge planning process, led by the attending physician.  Recommendations may be updated based on patient status, additional functional criteria and insurance authorization.  Follow Up Recommendations  Skilled nursing-short term rehab (<3 hours/day)     Assistance Recommended at Discharge Frequent or constant Supervision/Assistance  Equipment Recommendations  None recommended by PT    Recommendations for Other Services       Precautions / Restrictions Precautions Precautions: Fall Precaution Comments: O2 >/= 92% Restrictions Weight Bearing Restrictions: No     Mobility  Bed Mobility Overal bed mobility: Needs Assistance Bed Mobility: Rolling;Sidelying to Sit Rolling: Min guard Sidelying to sit: Min assist       General bed mobility comments: increased time/effort with BUE support on handrails, min guard to min assist for rolling with min cuing for hand placement and proper sequencing    Transfers Overall transfer level: Needs assistance Equipment used: Rolling walker (2 wheels) Transfers: Sit to/from Stand Sit to Stand: Min assist;Mod assist           General transfer comment: Increased time and effort, mod assit at EOB from lowest setting, min assist at EOB from elevated setting, min cuing for hand placement    Ambulation/Gait Ambulation/Gait assistance: Mod assist Gait Distance (Feet): 5 Feet Assistive device: Rolling walker (2 wheels) Gait Pattern/deviations: Step-to pattern;Decreased step length - right;Decreased step length - left;Decreased stride length Gait velocity:  decreased     General Gait Details: decreased knee ext BLE, decreased RW proximity, decreased step length/foot clearance BLE, forward flexed posture, and slowed cadence. Verbal  cues for RW proximity, demonstrated posterior lean without righting reactions when stepping backward, steps forward, backaward and lateral   Stairs             Wheelchair Mobility    Modified Rankin (Stroke Patients Only)       Balance Overall balance assessment: Needs assistance Sitting-balance support: No upper extremity supported;Feet supported Sitting balance-Leahy Scale: Fair Sitting balance - Comments: Posterior lean noted when performing LAQs at EOB Postural control: Posterior lean Standing balance support: Bilateral upper extremity supported;During functional activity Standing balance-Leahy Scale: Poor Standing balance comment: in RW due to decreased RW proximity and decreased bil knee ext, demonstrated posterior lean and PT had to hold RW down to prevent backwards fall onto bed                            Cognition Arousal/Alertness: Awake/alert Behavior During Therapy: WFL for tasks assessed/performed Overall Cognitive Status: Within Functional Limits for tasks assessed                                          Exercises Total Joint Exercises Ankle Circles/Pumps: AROM;Strengthening;Both;10 reps;Supine Quad Sets: AROM;Both;5 reps;Supine Towel Squeeze: AROM;Both;5 reps;Supine Short Arc Quad: AROM;Both;5 reps;Supine General Exercises - Lower Extremity Hip Flexion/Marching: AROM;Both;5 reps;Supine Other Exercises Other Exercises: Pt education on rolling for bed mobility    General Comments        Pertinent Vitals/Pain Pain Assessment: No/denies pain    Home Living                          Prior Function            PT Goals (current goals can now be found in the care plan section) Acute Rehab PT Goals Patient Stated Goal: "go home" PT Goal Formulation: With patient Time For Goal Achievement: 01/14/21 Potential to Achieve Goals: Good Progress towards PT goals: Progressing toward goals    Frequency    Min  2X/week      PT Plan Discharge plan needs to be updated    Co-evaluation              AM-PAC PT "6 Clicks" Mobility   Outcome Measure  Help needed turning from your back to your side while in a flat bed without using bedrails?: A Little Help needed moving from lying on your back to sitting on the side of a flat bed without using bedrails?: A Little Help needed moving to and from a bed to a chair (including a wheelchair)?: A Little Help needed standing up from a chair using your arms (e.g., wheelchair or bedside chair)?: A Lot Help needed to walk in hospital room?: A Lot Help needed climbing 3-5 steps with a railing? : Total 6 Click Score: 14    End of Session Equipment Utilized During Treatment: Gait belt Activity Tolerance: Patient tolerated treatment well Patient left: in bed;with call bell/phone within reach;with bed alarm set;Other (comment) (Floor fall mat reapplied) Nurse Communication: Mobility status PT Visit Diagnosis: Unsteadiness on feet (R26.81);History of falling (Z91.81);Muscle weakness (generalized) (M62.81);Difficulty in walking, not elsewhere classified (R26.2)     Time: 1962-2297 PT Time Calculation (  min) (ACUTE ONLY): 25 min  Charges:                        Sheldon Silvan SPT 01/04/21, 1:03 PM

## 2021-01-04 NOTE — Progress Notes (Addendum)
Patient ID: Brent Glover, male   DOB: [birth year], 85 y.o.   MRN: [id number] [facility] PROGRESS NOTE  Brent Glover OAC:[id number] DOB: 03-23-1935 DOA: 12/31/2020 PCP: Christen Bame, DO  HPI/Subjective: Patient seen this morning.  Had an episode of vomiting.  Had 2 bowel movements.  No abdominal pain.  Patient became hypotensive late morning and more confusion.  Patient admitted 6 days ago with acute blood loss anemia and GI bleed.  Patient had recent C. difficile infection.  Objective: Vitals:   01/04/21 0715 01/04/21 1125  BP: 97/77 (!) 80/44  Pulse: 75 73  Resp:    Temp: 98.3 F (36.8 C)   SpO2: 95% 93%    Intake/Output Summary (Last 24 hours) at 01/04/2021 1244 Last data filed at 01/03/2021 1450 Gross per 24 hour  Intake 240 ml  Output 1 ml  Net 239 ml   Filed Weights   12/30/20 1500 01/01/21 0940 01/03/21 0719  Weight: 75 kg 70.9 kg 72.9 kg    ROS: Review of Systems  Respiratory:  Positive for cough. Negative for shortness of breath.   Cardiovascular:  Negative for chest pain.  Gastrointestinal:  Positive for diarrhea, nausea and vomiting. Negative for abdominal pain.  Exam: Physical Exam HENT:     Head: Normocephalic.     Mouth/Throat:     Pharynx: No oropharyngeal exudate.  Eyes:     General: Lids are normal.     Conjunctiva/sclera: Conjunctivae normal.  Cardiovascular:     Rate and Rhythm: Normal rate and regular rhythm.     Heart sounds: Normal heart sounds, S1 normal and S2 normal.  Pulmonary:     Breath sounds: No decreased breath sounds, wheezing, rhonchi or rales.  Abdominal:     Palpations: Abdomen is soft.     Tenderness: There is no abdominal tenderness.  Musculoskeletal:     Right lower leg: Swelling present.     Left lower leg: Swelling present.  Skin:    General: Skin is warm.     Findings: No rash.  Neurological:     Mental Status: He is alert.     Comments: This morning answered some simple yes/no questions.       Scheduled Meds:  sodium chloride   Intravenous Once   acetaminophen  650 mg Oral Once   Chlorhexidine Gluconate Cloth  6 each Topical Q0600   dicyclomine  10 mg Oral TID AC & HS   epoetin (EPOGEN/PROCRIT) injection  4,000 Units Intravenous Q T,Th,Sa-HD   escitalopram  20 mg Oral Daily   ipratropium-albuterol  3 mL Nebulization Q6H   loratadine  10 mg Oral Daily   midodrine  10 mg Oral TID WC   midodrine  5 mg Oral Once   mirtazapine  7.5 mg Oral QHS   multivitamin  1 tablet Oral QHS   pantoprazole  40 mg Oral BID   rOPINIRole  1.5 mg Oral QHS   tamsulosin  0.8 mg Oral Daily   Brief history 85 year old man with past medical history of end-stage renal disease, hyperlipidemia, hypertension, COPD, chronic diastolic heart failure and BPH was sent to the hospital with weakness.  He was found to have a GI bleed with acute blood loss anemia and underlying anemia of chronic disease.  Seen by GI and they did not want to do any procedures.  Patient has recent C. difficile infection.  Patient did not have a bowel movement initially when he came in.  Seen by infectious disease and monitoring  off antibiotics at this point.  The patient had acute delirium today and an episode of vomiting.  Assessment/Plan:  Acute delirium.  Sitter.  As needed Haldol.  Start zyprexa at night. Diarrhea.  Recent C. difficile infection.  Did not have a bowel movement initially when he came in.  Infectious disease specialist saw the patient and did not recommend antibiotics at this point.  Continue to monitor.  Trial of Bentyl. GI bleed.  Seen by gastroenterology and they did not want to do any procedures at this time.  Patient received 1 unit of packed red blood cells during the hospital course.  I will give another unit of blood today for hemoglobin of 7.6. Hypotension.  Increase midodrine to 10 mg 3 times daily End-stage renal disease on dialysis.  Next dialysis today. Anemia of chronic disease with acute blood  loss.  Hemoglobin today 7.6.  Transfuse 1 unit of packed red cells today. Pancytopenia.  Peripheral smear looks okay.  On Epogen for anemia.  Hepatitis C negative.  Can consider outpatient hematology consultation. Hyperlipidemia unspecified.  I discontinued atorvastatin. Weakness.  As per nursing staff patient did not do well with physical therapy today Will get palliative care consultation TSH elevated at 22 last month.  We will add on a T4 to the labs.  Start low-dose Synthroid tomorrow.        Code Status:     Code Status Orders  (From admission, onward)           Start     Ordered   12/29/20 0045  Full code  Continuous        12/29/20 0049           Code Status History     Date Active Date Inactive Code Status Order ID Comments User Context   11/11/2020 2026 11/15/2020 1924 Full Code [id number]  Ivor Costa, MD ED   09/29/2017 1525 10/03/2017 1927 Full Code [id number]  Dustin Flock, MD Inpatient      Family Communication: Spoke with patient's wife on the phone. Disposition Plan: Status is: Inpatient.  With acute delirium today hospital course will likely be lengthened.  Time spent: 29 minutes  [place]

## 2021-01-04 NOTE — Progress Notes (Signed)
OT Cancellation Note  Patient Details Name: Brent Glover MRN: [id number] DOB: Dec 05, 1935   Cancelled Treatment:    Reason Eval/Treat Not Completed: Patient at procedure or test/ unavailable. OT order received and chart reviewed. Pt currently off the unit for dialysis . OT to re-attempt at next available time.   Darleen Crocker, [place], OTR/L , CBIS ascom [phone]  01/04/21, 2:41 PM

## 2021-01-04 NOTE — Progress Notes (Signed)
[facility], [place] 01/04/21  Subjective:   LOS: 6  Patient known to our practice from previous admissions. This time he is admitted for generalized weakness, loose stools which are dark in color.    Patient resting in bed, waiting on breakfast.  States diarrhea has decreased some Scheduled for dialysis later today   Objective:  Vital signs in last 24 hours:  Temp:  [97.2 F (36.2 C)-98.3 F (36.8 C)] 98.3 F (36.8 C) (11/29 0715) Pulse Rate:  [68-83] 73 (11/29 1125) Resp:  [17-20] 20 (11/29 0110) BP: (80-124)/(44-77) 80/44 (11/29 1125) SpO2:  [93 %-100 %] 93 % (11/29 1125)  Weight change:  Filed Weights   12/30/20 1500 01/01/21 0940 01/03/21 0719  Weight: 75 kg 70.9 kg 72.9 kg    Intake/Output:    Intake/Output Summary (Last 24 hours) at 01/04/2021 1154 Last data filed at 01/03/2021 1450 Gross per 24 hour  Intake 240 ml  Output 2 ml  Net 238 ml      Physical Exam: General: Frail, elderly gentleman, laying in the bed  HEENT Anicteric, hearing intact.  Pulm/lungs Normal breathing effort, clear bilateral  CVS/Heart No rub or gallop  Abdomen:  soft, nontender  Extremities: No edema  Neurologic: Alert, oriented  Skin: No acute rashes  Access: Left forearm AV fistula       Basic Metabolic Panel:  Recent Labs  Lab 12/30/20 0611 12/31/20 0927 01/01/21 1523 01/02/21 0344 01/03/21 0505 01/04/21 0348  NA 138 134* 131* 133* 132* 132*  K 3.5 3.7 3.3* 3.5 3.6 3.8  CL 100 97* 96* 98 98 96*  CO2 32 32 32 31 29 29   GLUCOSE 77 105* 89 114* 88 100*  BUN 31* 17 9 11 18  24*  CREATININE 4.55* 3.44* 2.30* 3.06* 4.32* 5.49*  CALCIUM 7.7* 7.7* 7.4* 7.6* 7.5* 7.6*  PHOS 2.8 1.7*  --  1.2* 1.4* 1.7*      CBC: Recent Labs  Lab 12/31/20 0927 01/01/21 1523 01/02/21 0344 01/03/21 0505 01/04/21 0348  WBC 3.6* 3.7* 4.8 4.7 5.1  NEUTROABS 2.7 2.7 3.6 3.5 3.9  HGB 7.9* 8.3* 7.7* 7.7* 7.6*  HCT 24.3* 25.3* 23.7* 23.5* 23.4*  MCV  96.8 98.4 97.1 96.7 96.7  PLT 55* 56* 59* 60* 70*       Lab Results  Component Value Date   HEPBSAG NON REACTIVE 12/29/2020   HEPBSAB Reactive (A) 11/13/2020      Microbiology:  Recent Results (from the past 240 hour(s))  Resp Panel by RT-PCR (Flu A&B, Covid) Nasopharyngeal Swab     Status: None   Collection Time: 01/03/2021 11:08 PM   Specimen: Nasopharyngeal Swab; Nasopharyngeal(NP) swabs in vial transport medium  Result Value Ref Range Status   SARS Coronavirus 2 by RT PCR NEGATIVE NEGATIVE Final    Comment: (NOTE) SARS-CoV-2 target nucleic acids are NOT DETECTED.  The SARS-CoV-2 RNA is generally detectable in upper respiratory specimens during the acute phase of infection. The lowest concentration of SARS-CoV-2 viral copies this assay can detect is 138 copies/mL. A negative result does not preclude SARS-Cov-2 infection and should not be used as the sole basis for treatment or other patient management decisions. A negative result may occur with  improper specimen collection/handling, submission of specimen other than nasopharyngeal swab, presence of viral mutation(s) within the areas targeted by this assay, and inadequate number of viral copies(<138 copies/mL). A negative result must be combined with clinical observations, patient history, and epidemiological information. The expected result is Negative.  Fact Sheet for Patients:  EntrepreneurPulse.com.au  Fact Sheet for Healthcare Providers:  IncredibleEmployment.be  This test is no t yet approved or cleared by the [place] FDA and  has been authorized for detection and/or diagnosis of SARS-CoV-2 by FDA under an Emergency Use Authorization (EUA). This EUA will remain  in effect (meaning this test can be used) for the duration of the COVID-19 declaration under Section 564(b)(1) of the Act, 21 U.S.C.section 360bbb-3(b)(1), unless the authorization is terminated  or revoked  sooner.       Influenza A by PCR NEGATIVE NEGATIVE Final   Influenza B by PCR NEGATIVE NEGATIVE Final    Comment: (NOTE) The Xpert Xpress SARS-CoV-2/FLU/RSV plus assay is intended as an aid in the diagnosis of influenza from Nasopharyngeal swab specimens and should not be used as a sole basis for treatment. Nasal washings and aspirates are unacceptable for Xpert Xpress SARS-CoV-2/FLU/RSV testing.  Fact Sheet for Patients: EntrepreneurPulse.com.au  Fact Sheet for Healthcare Providers: IncredibleEmployment.be  This test is not yet approved or cleared by the [place] FDA and has been authorized for detection and/or diagnosis of SARS-CoV-2 by FDA under an Emergency Use Authorization (EUA). This EUA will remain in effect (meaning this test can be used) for the duration of the COVID-19 declaration under Section 564(b)(1) of the Act, 21 U.S.C. section 360bbb-3(b)(1), unless the authorization is terminated or revoked.  Performed at [facility], [address] [place], [place] [address]   Blood Culture (routine x 2)     Status: None   Collection Time: 01/04/2021 11:09 PM   Specimen: BLOOD LEFT FOREARM  Result Value Ref Range Status   Specimen Description BLOOD LEFT FOREARM  Final   Special Requests   Final    BOTTLES DRAWN AEROBIC AND ANAEROBIC Blood Culture adequate volume   Culture   Final    NO GROWTH 5 DAYS Performed at [facility], [address] [place], [place] [address]    Report Status 01/02/2021 FINAL  Final  Blood Culture (routine x 2)     Status: None   Collection Time: 12/10/2020 11:09 PM   Specimen: BLOOD RIGHT FOREARM  Result Value Ref Range Status   Specimen Description BLOOD RIGHT FOREARM  Final   Special Requests   Final    BOTTLES DRAWN AEROBIC AND ANAEROBIC Blood Culture results may not be optimal due to an excessive volume of blood received in culture bottles   Culture   Final    NO GROWTH 5  DAYS Performed at [facility], [address], [place], [place] [address]    Report Status 01/02/2021 FINAL  Final    Coagulation Studies: No results for input(s): LABPROT, INR in the last 72 hours.   Urinalysis: No results for input(s): COLORURINE, LABSPEC, PHURINE, GLUCOSEU, HGBUR, BILIRUBINUR, KETONESUR, PROTEINUR, UROBILINOGEN, NITRITE, LEUKOCYTESUR in the last 72 hours.  Invalid input(s): APPERANCEUR    Imaging: No results found.   Medications:      sodium chloride   Intravenous Once   acetaminophen  650 mg Oral Once   Chlorhexidine Gluconate Cloth  6 each Topical Q0600   dicyclomine  10 mg Oral TID AC & HS   epoetin (EPOGEN/PROCRIT) injection  4,000 Units Intravenous Q T,Th,Sa-HD   escitalopram  20 mg Oral Daily   ipratropium-albuterol  3 mL Nebulization Q6H   loratadine  10 mg Oral Daily   midodrine  10 mg Oral TID WC   multivitamin  1 tablet Oral QHS   pantoprazole  40 mg Oral BID   rOPINIRole  1.5 mg Oral QHS   tamsulosin  0.8 mg Oral Daily   acetaminophen **OR** acetaminophen, fluticasone, haloperidol lactate, melatonin, ondansetron **OR** ondansetron (ZOFRAN) IV  Assessment/ Plan:  85 y.o. male with   end stage renal disease on hemodialysis, hypotension, coronary artery disease status post CABG, hyperlipidemia, BPH, congestive heart failure, COPD, AAA status post stent, peptic ulcer disease was admitted on 12/10/2020 for  Principal Problem:   GI bleeding Active Problems:   Chronic combined systolic and diastolic heart failure ([name])   Hyperlipidemia   H/O coronary artery bypass surgery   Type II diabetes mellitus with renal manifestations ([name])   ESRD (end stage renal disease) ([name])   Pancytopenia ([name])   CAD (coronary artery disease)   Hypotension   Thrombocytopenia ([name])   H/O Clostridium difficile infection   Aorto-biiliac stent graft ([name])   PVD (peripheral vascular disease) ([place])   AAA (abdominal aortic aneurysm) without rupture    Diarrhea   Anemia of chronic disease   Chronic obstructive pulmonary disease ([place])   Weakness  GI bleeding [K92.2]  [facility] TTS [facility] [address] Left AVF 72kg  #. ESRD Will receive dialysis later today.  No UF  #. Anemia of CKD and GI blood loss.  Lab Results  Component Value Date   HGB 7.6 (L) 01/04/2021  Hemoglobin below goal.  Patient will receive 1 unit blood transfusion during dialysis today Low-dose EPO with dialysis treatments.  #. Secondary hyperparathyroidism of renal origin N 25.81   No results found for: PTH Lab Results  Component Value Date   PHOS 1.7 (L) 01/04/2021   Phosphorus improving with nutrition.  # Hypokalemia  Lab Results  Component Value Date   K 3.8 01/04/2021     stable   LOS: [place] 11/29/202211:54 AM  [facility] [place], [place]

## 2021-01-04 NOTE — Progress Notes (Signed)
Morning medications held due to patient vomiting (including midodrine).  PRN IV Zofran administered.  Will continue to monitor.

## 2021-01-05 ENCOUNTER — Inpatient Hospital Stay: Payer: Medicare Other

## 2021-01-05 DIAGNOSIS — R410 Disorientation, unspecified: Secondary | ICD-10-CM | POA: Diagnosis not present

## 2021-01-05 DIAGNOSIS — R531 Weakness: Secondary | ICD-10-CM | POA: Diagnosis not present

## 2021-01-05 DIAGNOSIS — K921 Melena: Secondary | ICD-10-CM | POA: Diagnosis not present

## 2021-01-05 DIAGNOSIS — D638 Anemia in other chronic diseases classified elsewhere: Secondary | ICD-10-CM | POA: Diagnosis not present

## 2021-01-05 DIAGNOSIS — Z8619 Personal history of other infectious and parasitic diseases: Secondary | ICD-10-CM

## 2021-01-05 DIAGNOSIS — Z789 Other specified health status: Secondary | ICD-10-CM

## 2021-01-05 DIAGNOSIS — Z7189 Other specified counseling: Secondary | ICD-10-CM

## 2021-01-05 DIAGNOSIS — Z515 Encounter for palliative care: Secondary | ICD-10-CM

## 2021-01-05 DIAGNOSIS — I25810 Atherosclerosis of coronary artery bypass graft(s) without angina pectoris: Secondary | ICD-10-CM

## 2021-01-05 DIAGNOSIS — I5042 Chronic combined systolic (congestive) and diastolic (congestive) heart failure: Secondary | ICD-10-CM | POA: Diagnosis not present

## 2021-01-05 DIAGNOSIS — I779 Disorder of arteries and arterioles, unspecified: Secondary | ICD-10-CM | POA: Diagnosis not present

## 2021-01-05 LAB — CBC
HCT: 22.2 % — ABNORMAL LOW (ref 39.0–52.0)
Hemoglobin: 7.1 g/dL — ABNORMAL LOW (ref 13.0–17.0)
MCH: 31.1 pg (ref 26.0–34.0)
MCHC: 32 g/dL (ref 30.0–36.0)
MCV: 97.4 fL (ref 80.0–100.0)
Platelets: 62 10*3/uL — ABNORMAL LOW (ref 150–400)
RBC: 2.28 MIL/uL — ABNORMAL LOW (ref 4.22–5.81)
RDW: 19.7 % — ABNORMAL HIGH (ref 11.5–15.5)
WBC: 6.3 10*3/uL (ref 4.0–10.5)
nRBC: 0 % (ref 0.0–0.2)

## 2021-01-05 LAB — RENAL FUNCTION PANEL
Albumin: 2.1 g/dL — ABNORMAL LOW (ref 3.5–5.0)
Anion gap: 6 (ref 5–15)
BUN: 25 mg/dL — ABNORMAL HIGH (ref 8–23)
CO2: 29 mmol/L (ref 22–32)
Calcium: 7.7 mg/dL — ABNORMAL LOW (ref 8.9–10.3)
Chloride: 102 mmol/L (ref 98–111)
Creatinine, Ser: 4.05 mg/dL — ABNORMAL HIGH (ref 0.61–1.24)
GFR, Estimated: 14 mL/min — ABNORMAL LOW (ref >60)
Glucose, Bld: 39 mg/dL — CL (ref 70–99)
Phosphorus: 1.9 mg/dL — ABNORMAL LOW (ref 2.5–4.6)
Potassium: 4.2 mmol/L (ref 3.5–5.1)
Sodium: 137 mmol/L (ref 135–145)

## 2021-01-05 LAB — BLOOD GAS, VENOUS
Bicarbonate: 35.1 mmol/L — ABNORMAL HIGH (ref 20.0–28.0)
Patient temperature: 37
pCO2, Ven: 43 mmHg — ABNORMAL LOW (ref 44.0–60.0)
pH, Ven: 7.52 — ABNORMAL HIGH (ref 7.250–7.430)

## 2021-01-05 LAB — GLUCOSE, CAPILLARY
Glucose-Capillary: 27 mg/dL — CL (ref 70–99)
Glucose-Capillary: 48 mg/dL — ABNORMAL LOW (ref 70–99)
Glucose-Capillary: 57 mg/dL — ABNORMAL LOW (ref 70–99)
Glucose-Capillary: 91 mg/dL (ref 70–99)
Glucose-Capillary: 93 mg/dL (ref 70–99)
Glucose-Capillary: 96 mg/dL (ref 70–99)

## 2021-01-05 LAB — HEMOGLOBIN AND HEMATOCRIT, BLOOD
HCT: 24.2 % — ABNORMAL LOW (ref 39.0–52.0)
HCT: 24.5 % — ABNORMAL LOW (ref 39.0–52.0)
Hemoglobin: 8 g/dL — ABNORMAL LOW (ref 13.0–17.0)
Hemoglobin: 7.9 g/dL — ABNORMAL LOW (ref 13.0–17.0)

## 2021-01-05 LAB — PREPARE RBC (CROSSMATCH)

## 2021-01-05 LAB — AMMONIA: Ammonia: 27 umol/L (ref 9–35)

## 2021-01-05 LAB — TSH: TSH: 36.162 u[IU]/mL — ABNORMAL HIGH (ref 0.350–4.500)

## 2021-01-05 MED ORDER — DEXTROSE 50 % IV SOLN: 1.0000 | Freq: Once | INTRAVENOUS | Status: AC

## 2021-01-05 MED ORDER — DEXTROSE 5 % IV SOLN: INTRAVENOUS | Status: DC

## 2021-01-05 MED ORDER — SODIUM CHLORIDE 0.9% IV SOLUTION: Freq: Once | INTRAVENOUS | Status: AC

## 2021-01-05 MED ORDER — DEXTROSE 50 % IV SOLN
INTRAVENOUS | Status: AC
  Filled 2021-01-05: qty 50

## 2021-01-05 MED ORDER — HALOPERIDOL LACTATE 5 MG/ML IJ SOLN
1.0000 mg | Freq: Four times a day (QID) | INTRAMUSCULAR | Status: DC | PRN
  Administered 2021-01-05: 1 mg via INTRAVENOUS

## 2021-01-05 MED ORDER — DEXTROSE 50 % IV SOLN
INTRAVENOUS | Status: AC
  Administered 2021-01-05: 50 mL
  Filled 2021-01-05: qty 50

## 2021-01-05 MED ORDER — DEXTROSE 50 % IV SOLN
25.0000 g | INTRAVENOUS | Status: AC
  Administered 2021-01-05: 25 g via INTRAVENOUS
  Filled 2021-01-05: qty 50

## 2021-01-05 MED ORDER — DEXTROSE 10 % IV SOLN: INTRAVENOUS | Status: DC

## 2021-01-05 MED ORDER — DEXTROSE 50 % IV SOLN
25.0000 g | INTRAVENOUS | Status: AC
  Administered 2021-01-05: 25 g via INTRAVENOUS

## 2021-01-05 NOTE — Consult Note (Addendum)
Consultation Note Date: 01/05/2021   Patient Name: Brent Glover  DOB: 06/28/1935  MRN: [id number]  Age / Sex: 85 y.o., male  PCP: Christen Bame, DO Referring Physician: Ezekiel Slocumb, DO  Reason for Consultation: Establishing goals of care  HPI/Patient Profile: 85 y.o. male  with past medical history of ESRD (on HD T/TH/S), HLD, HTN, COPD, chronic diastolic HF, BPH, anemia of chronic disease, and recent C. difficile infection admitted on 12/15/2020 with weakness.  Patient also experienced a GI bleed.  GI was consulted. As per GI note, no plan for any endoscopic procedures. Given frailty, recurrent hospital admissions, and medical co-morbidities GI recommends palliative care.  Patient has become increasingly confused.  CT of head does not reveal etiology of encephalopathy.  Ammonia levels are normal.  Palliative medicine was consulted to discuss goals of care.  Clinical Assessment and Goals of Care: I have reviewed medical records including EPIC notes, labs and imaging, assessed the patient and then met with patient at bedside this morning and then with patient's wife this afternoon to discuss diagnosis prognosis, GOC, EOL wishes, disposition and options.   Morning meeting with pt: Patient was able to verbalize his name and the year.  When I shared that today was November 30 he recognized that this was his birthday.  He had complaints of feeling cold around his toes and asked if I could put in a warm blanket on him.  I asked if he understood what was happening and why he was in the hospital.  He said he was in [place].  He denied pain and was super focused on asking how we can make his toes feel warmer.  He was fidgety and agitated throughout our discussion.  Given that his mentation has been waxing and waning I asked and he gave permission for me to speak with his family regarding his care.  Noon  meeting with wife: I met with the patient's wife Brent Glover.  I introduced Palliative Medicine as specialized medical care for people living with serious illness. It focuses on providing relief from the symptoms and stress of a serious illness. The goal is to improve quality of life for both the patient and the family.  We discussed a brief life review of the patient.  Brent Glover has a strained relationship with her husband.  I allowed time and space for her to share these personal details.   She reports he worked in [employer] his entire life.  She also endorses he does not take care of himself physically.  She also reports he experiences frequent falls with head injuries at home for which she refuses to seek medical treatment.  She also reports he stopped smoking in 2016 but started smoking again about 6 months ago.  As far as functional and nutritional status prior to admission patient was independent.  He would cook his own meals, drive himself wherever he wanted to go, and was able to perform all ADLs by himself.  Brent Glover reports he was becoming increasingly forgetful and started  to have much more frequent falls at home prior to this admission.  We discussed patient's current illness and what it means in the larger context of patient's on-going co-morbidities.  I outlined that not only is he had end-stage kidney failure but also acute anemia and encephalopathy.  I shared my concerns that his comorbidities and his age are factors that are working against him having a significant recovery back to his baseline.  Natural disease trajectory and expectations at EOL were discussed.  I attempted to elicit values and goals of care important to the patient.  Brent Glover shares that when the patient was "in his right mind" he would say do what ever he can to keep me alive.  Brent Glover shares the patient is scared of dying.  She shares that he thinks there is a "magic pill "that we will take care of his physical problems.  I shared my concerns  that "keeping him alive" would not allow him to have a meaningful interaction with his world and may not provide him any avenue for any significant or meaningful recovery.  She was in agreement that this would not be in the patient's best interest.  She endorses he has no full understanding of what "keeping him alive" what actually look like should he need a cardiopulmonary resuscitation.  She verbalized understanding that should he need CPR would likely cause him pain, perhaps break ribs, and has a great potential to be unsuccessful given the patient's age and current physical state.  The difference between aggressive medical intervention and comfort care was considered and discussed.  Brent Glover believes the patient will not recover from this encephalopathy.  She does not think he will be able to physically rehabilitate.   Human mortality was discussed.  Therapeutic silence and space given for patient's wife to share her thoughts and feelings.  Hospice services was discussed.  She said that someone had suggested hospice to her husband more than 3 weeks ago at which time he declined saying he was not ready to die.  She believes that if he does not turn around within the next 24 hours that she would like to enact his hospice benefits.  I shared that we would await the results of the CT scan, see if there is any improvement over the next 24 hours, and reevaluate if he is a candidate for hospice.  She shares she is in full agreement with considering hospice tomorrow.  She also shared she is not able to manage him at home and would need to have him evaluated for inpatient hospice facility.   Discussed with patient/family the importance of continued conversation with family and the medical providers regarding overall plan of care and treatment options, ensuring decisions are within the context of the patient's values and GOCs.  Brent Glover shared that all of her children trust her decisions.  I offered to speak with any other  family members should that need arise.  Questions and concerns were addressed. The family was encouraged to call with questions or concerns.   Primary Decision Maker NEXT OF KIN  Code Status/Advance Care Planning: Full code If not improvement in 24H, patient's wife would like to proceed with comfort care and Hospice evaluation  Prognosis:   Unable to determine  Discharge Planning: To Be Determined  Primary Diagnoses: Present on Admission:  GI bleeding  CAD (coronary artery disease)  Chronic combined systolic and diastolic heart failure ([name])  ESRD (end stage renal disease) ([name])  Hypotension  Thrombocytopenia ([place])  Type II diabetes mellitus with renal manifestations ([name])  H/O Clostridium difficile infection  Aorto-biiliac stent graft ([name])  PVD (peripheral vascular disease) ([place])   Physical Exam Constitutional:      Appearance: He is ill-appearing.  HENT:     Head: Normocephalic.     Mouth/Throat:     Mouth: Mucous membranes are moist.  Cardiovascular:     Rate and Rhythm: Normal rate.     Pulses: Normal pulses.  Pulmonary:     Comments: Congested, non-productive cough Abdominal:     Palpations: Abdomen is soft.  Musculoskeletal:     Comments: Generalized weakness  Skin:    General: Skin is warm and dry.  Neurological:     Mental Status: He is disoriented.    Vital Signs: BP (!) 103/52   Pulse 87   Temp 99.1 F (37.3 C) (Oral)   Resp 20   Ht 5' 7"  (1.702 m)   Wt 71.5 kg Comment: bedscale  SpO2 96%   BMI 24.69 kg/m  Pain Scale: PAINAD   Pain Score: Asleep SpO2: SpO2: 96 % O2 Device:SpO2: 96 % O2 Flow Rate: .O2 Flow Rate (L/min): 5 L/min  Palliative Assessment/Data: 30%     I discussed this patient's plan of care with patient, patient's wife, nursing, Dr. Arbutus Ped.  Thank you for this consult. Palliative medicine will continue to follow and assist holistically.   Time Total: 90 minutes Greater than 50%  of this time was spent counseling  and coordinating care related to the above assessment and plan.  Signed by: Jordan Hawks, DNP, FNP-BC Palliative Medicine    Please contact Palliative Medicine Team phone at [phone] for questions and concerns.  For individual provider: See Shea Evans

## 2021-01-05 NOTE — Progress Notes (Signed)
PROGRESS NOTE    Brent Glover   EHM:[id number]  DOB: 11/15/1935  PCP: Christen Bame, DO    DOA: 12/26/2020 LOS: 7    Brief Narrative / Hospital Course to Date:   85 year old male with past medical history of ESRD on hemodialysis, hyperlipidemia, hypertension, COPD, chronic diastolic heart failure and BPH was sent to the hospital with generalized weakness.  He was found to have a GI bleed with acute blood loss anemia and underlying anemia of chronic disease.  GI was consulted, did not want to do any endoscopic procedures.  Of note, patient had a recent C. difficile infection, but did not have a bowel movement initially when he came in.    Infectious disease was consulted and recommended monitoring off antibiotics at this point.   Patient now with acute delirium requiring as needed Haldol and started on Zyprexa at night.  11/30, dark red stools reported by bedside RN, low-grade temp 100.1, patient still delirious  Assessment & Plan   Principal Problem:   GI bleeding Active Problems:   Chronic combined systolic and diastolic heart failure ([name])   Hyperlipidemia   H/O coronary artery bypass surgery   Type II diabetes mellitus with renal manifestations ([name])   ESRD (end stage renal disease) ([name])   Pancytopenia ([name])   CAD (coronary artery disease)   Hypotension   Thrombocytopenia ([name])   H/O Clostridium difficile infection   Aorto-biiliac stent graft ([place])   PVD (peripheral vascular disease) ([place])   AAA (abdominal aortic aneurysm) without rupture   Diarrhea   Anemia of chronic disease   Chronic obstructive pulmonary disease ([place])   Weakness   Acute delirium   Acute delirium -likely related to anemia, possibly infection and hospital environment and elderly patient.  No focal neurologic deficits have been seen. --Rule out underlying causes: CT head, venous blood gas and ammonia levels --Monitor blood glucose --Haldol as needed --Zyprexa at night --Sitter for  safety --Palliative care consulted for [place] discussions  GI bleeding with acute blood loss anemia superimposed on anemia of chronic disease - GI was consulted on admission, recommended against endoscopic procedures. Status post 2 units packed red cells since admission, most recently 11/29. 11/30: Hemoglobin again 7.1 and ongoing melena Per bedside RN. --Transfuse additional unit PRBCs --Monitor hemoglobin, transfuse if less than 7 or with ongoing bleeding --Monitor CBC  Hypoglycemia -likely due to poor p.o. intake.   CBG this afternoon 27, likely contributes to his current mental status. --Start D5W at 50 cc/h for now --Hypoglycemia protocol  -- Every 4 hour CBGs  Hypotension -BP stable on midodrine. --Continue midodrine 10 mg 3 times daily -- Albumin with dialysis if needed, per nephrology  Pancytopenia -peripheral smear unremarkable.  On Epogen for anemia.  Hep C negative.  Consider hematology consult as outpatient., suspect thrombocytopenia due to infection.  End-stage renal disease -on hemodialysis T/Th/S. -- Nephrology following  Chronic systolic CHF: Currently appears euvolemic, compensated.  Echo on 09/30/2017 showed EF of 35-40% with grade 1 diastolic dysfunction.  --Volume management w/ HD --Daily weights --Caution if using IV fluids  History of CAD stable -   no active chest pain.  Monitor.yperlipidemia -atorvastatin discontinued earlier this admission  Generalized weakness -PT evaluation.  Fall precautions.  Elevated TSH -TSH was 22 when checked early October.  T4 here mildly elevated. --Recheck TSH and likely start Synthroid tomorrow.  Depression -chronic.  Continue home Lexapro  BPH -continue Flomax   Patient BMI: Body mass index is 24.69 kg/m.  DVT prophylaxis: SCDs Start: 12/29/20 0045   Diet:  Diet Orders (From admission, onward)     Start     Ordered   12/31/20 0907  Diet renal with fluid restriction Fluid restriction: 1200 mL Fluid; Room service  appropriate? Yes; Fluid consistency: Thin  Diet effective now       Question Answer Comment  Fluid restriction: 1200 mL Fluid   Room service appropriate? Yes   Fluid consistency: Thin      12/31/20 0906              Code Status: Full Code   Subjective 01/05/21    Patient awake but with eyes closed, laying in bed when seen this morning.  Bedside RN reports an episode of dark red stool earlier this morning.  Temp of 100.1 this morning.  Patient does follow commands and answer some questions.  Denies having any chest or abdominal pain, states his feet are very cold.  Unable to elicit any other complaints.  Patient continually groaning throughout the encounter.   Disposition Plan & Communication   Status is: Inpatient  Remains inpatient appropriate because: Severity of illness, requiring blood transfusion, worsening delirium and new fevers.   Consults, Procedures, Significant Events   Consultants:  Infectious disease Gastroenterology Nephrology  Procedures:   None  Antimicrobials:  Anti-infectives (From admission, onward)    None         Micro    Objective   Vitals:   01/04/21 1815 01/04/21 1844 01/04/21 1935 01/05/21 0250  BP:  (!) 99/59 (!) 138/114   Pulse:  80 77   Resp:  18 18   Temp:  98.7 F (37.1 C)    TempSrc:  Oral    SpO2:  96% 97% 92%  Weight: 71.5 kg     Height:        Intake/Output Summary (Last 24 hours) at 01/05/2021 0800 Last data filed at 01/04/2021 1809 Gross per 24 hour  Intake 350 ml  Output 0 ml  Net 350 ml   Filed Weights   01/03/21 0719 01/04/21 1430 01/04/21 1815  Weight: 72.9 kg 71.1 kg 71.5 kg    Physical Exam: Caveat-exam limited by patient's delirium, only follows some commands, continually groaning  General exam: awake, alert, groaning continuously HEENT: Eyes closed, hearing grossly normal  Respiratory system: Lungs overall clear on exam limited by patient groaning continuously, no wheezes or rhonchi were heard,  normal respiratory effort, on 5 L/min nasal cannula oxygen Cardiovascular system: normal S1/S2, RRR, no pedal edema.   Gastrointestinal system: soft, NT, ND, no HSM felt, +bowel sounds. Central nervous system: Oriented to self, grip strength 5/5 and symmetric, no other gross focal neurologic deficits, minimally verbal but speech seems normal Extremities: Distal extremities are warm to touch, no edema, moves all extremities   Labs   Data Reviewed: I have personally reviewed following labs and imaging studies  CBC: Recent Labs  Lab 12/31/20 0927 01/01/21 1523 01/02/21 0344 01/03/21 0505 01/04/21 0348 01/05/21 0619  WBC 3.6* 3.7* 4.8 4.7 5.1 6.3  NEUTROABS 2.7 2.7 3.6 3.5 3.9  --   HGB 7.9* 8.3* 7.7* 7.7* 7.6* 7.1*  HCT 24.3* 25.3* 23.7* 23.5* 23.4* 22.2*  MCV 96.8 98.4 97.1 96.7 96.7 97.4  PLT 55* 56* 59* 60* 70* 62*   Basic Metabolic Panel: Recent Labs  Lab 12/31/20 0927 01/01/21 1523 01/02/21 0344 01/03/21 0505 01/04/21 0348 01/05/21 0619  NA 134* 131* 133* 132* 132* 137  K 3.7 3.3* 3.5 3.6  3.8 4.2  CL 97* 96* 98 98 96* 102  CO2 32 32 31 29 29 29   GLUCOSE 105* 89 114* 88 100* 39*  BUN 17 9 11 18  24* 25*  CREATININE 3.44* 2.30* 3.06* 4.32* 5.49* 4.05*  CALCIUM 7.7* 7.4* 7.6* 7.5* 7.6* 7.7*  PHOS 1.7*  --  1.2* 1.4* 1.7* 1.9*   GFR: Estimated Creatinine Clearance: 12.9 mL/min (A) (by C-G formula based on SCr of 4.05 mg/dL (H)). Liver Function Tests: Recent Labs  Lab 01/01/21 1523 01/02/21 0344 01/03/21 0505 01/04/21 0348 01/05/21 0619  AST 34  --   --   --   --   ALT 13  --   --   --   --   ALKPHOS 108  --   --   --   --   BILITOT 1.5*  --   --   --   --   PROT 5.7*  --   --   --   --   ALBUMIN 2.1* 1.8* 2.0* 2.0* 2.1*   No results for input(s): LIPASE, AMYLASE in the last 168 hours. No results for input(s): AMMONIA in the last 168 hours. Coagulation Profile: No results for input(s): INR, PROTIME in the last 168 hours. Cardiac Enzymes: No results for  input(s): CKTOTAL, CKMB, CKMBINDEX, TROPONINI in the last 168 hours. BNP (last 3 results) No results for input(s): PROBNP in the last 8760 hours. HbA1C: No results for input(s): HGBA1C in the last 72 hours. CBG: Recent Labs  Lab 01/05/21 0737  GLUCAP 93   Lipid Profile: No results for input(s): CHOL, HDL, LDLCALC, TRIG, CHOLHDL, LDLDIRECT in the last 72 hours. Thyroid Function Tests: Recent Labs    01/04/21 0348  FREET4 1.20*   Anemia Panel: No results for input(s): VITAMINB12, FOLATE, FERRITIN, TIBC, IRON, RETICCTPCT in the last 72 hours. Sepsis Labs: Recent Labs  Lab 01/01/21 1523 01/02/21 0344  PROCALCITON <0.10 0.10    Recent Results (from the past 240 hour(s))  Resp Panel by RT-PCR (Flu A&B, Covid) Nasopharyngeal Swab     Status: None   Collection Time: 12/26/2020 11:08 PM   Specimen: Nasopharyngeal Swab; Nasopharyngeal(NP) swabs in vial transport medium  Result Value Ref Range Status   SARS Coronavirus 2 by RT PCR NEGATIVE NEGATIVE Final    Comment: (NOTE) SARS-CoV-2 target nucleic acids are NOT DETECTED.  The SARS-CoV-2 RNA is generally detectable in upper respiratory specimens during the acute phase of infection. The lowest concentration of SARS-CoV-2 viral copies this assay can detect is 138 copies/mL. A negative result does not preclude SARS-Cov-2 infection and should not be used as the sole basis for treatment or other patient management decisions. A negative result may occur with  improper specimen collection/handling, submission of specimen other than nasopharyngeal swab, presence of viral mutation(s) within the areas targeted by this assay, and inadequate number of viral copies(<138 copies/mL). A negative result must be combined with clinical observations, patient history, and epidemiological information. The expected result is Negative.  Fact Sheet for Patients:  EntrepreneurPulse.com.au  Fact Sheet for Healthcare Providers:   IncredibleEmployment.be  This test is no t yet approved or cleared by the [place] FDA and  has been authorized for detection and/or diagnosis of SARS-CoV-2 by FDA under an Emergency Use Authorization (EUA). This EUA will remain  in effect (meaning this test can be used) for the duration of the COVID-19 declaration under Section 564(b)(1) of the Act, 21 U.S.C.section 360bbb-3(b)(1), unless the authorization is terminated  or revoked sooner.  Influenza A by PCR NEGATIVE NEGATIVE Final   Influenza B by PCR NEGATIVE NEGATIVE Final    Comment: (NOTE) The Xpert Xpress SARS-CoV-2/FLU/RSV plus assay is intended as an aid in the diagnosis of influenza from Nasopharyngeal swab specimens and should not be used as a sole basis for treatment. Nasal washings and aspirates are unacceptable for Xpert Xpress SARS-CoV-2/FLU/RSV testing.  Fact Sheet for Patients: EntrepreneurPulse.com.au  Fact Sheet for Healthcare Providers: IncredibleEmployment.be  This test is not yet approved or cleared by the [place] FDA and has been authorized for detection and/or diagnosis of SARS-CoV-2 by FDA under an Emergency Use Authorization (EUA). This EUA will remain in effect (meaning this test can be used) for the duration of the COVID-19 declaration under Section 564(b)(1) of the Act, 21 U.S.C. section 360bbb-3(b)(1), unless the authorization is terminated or revoked.  Performed at [facility], [address] [place], [place] [address]   Blood Culture (routine x 2)     Status: None   Collection Time: 01/04/2021 11:09 PM   Specimen: BLOOD LEFT FOREARM  Result Value Ref Range Status   Specimen Description BLOOD LEFT FOREARM  Final   Special Requests   Final    BOTTLES DRAWN AEROBIC AND ANAEROBIC Blood Culture adequate volume   Culture   Final    NO GROWTH 5 DAYS Performed at [facility], [address]  [place], [place] [address]    Report Status 01/02/2021 FINAL  Final  Blood Culture (routine x 2)     Status: None   Collection Time: 12/12/2020 11:09 PM   Specimen: BLOOD RIGHT FOREARM  Result Value Ref Range Status   Specimen Description BLOOD RIGHT FOREARM  Final   Special Requests   Final    BOTTLES DRAWN AEROBIC AND ANAEROBIC Blood Culture results may not be optimal due to an excessive volume of blood received in culture bottles   Culture   Final    NO GROWTH 5 DAYS Performed at [facility], [address] [place], [place] [address]    Report Status 01/02/2021 FINAL  Final      Imaging Studies   No results found.   Medications   Scheduled Meds:  sodium chloride   Intravenous Once   Chlorhexidine Gluconate Cloth  6 each Topical Q0600   dicyclomine  10 mg Oral TID AC & HS   epoetin (EPOGEN/PROCRIT) injection  4,000 Units Intravenous Q T,Th,Sa-HD   escitalopram  20 mg Oral Daily   ipratropium-albuterol  3 mL Nebulization Q6H   levothyroxine  12.5 mcg Oral Q0600   loratadine  10 mg Oral Daily   midodrine  10 mg Oral TID WC   multivitamin  1 tablet Oral QHS   OLANZapine  5 mg Oral QHS   pantoprazole  40 mg Oral BID   rOPINIRole  1.5 mg Oral QHS   tamsulosin  0.8 mg Oral Daily   Continuous Infusions:     LOS: 7 days    Time spent: 30 minutes    Ezekiel Slocumb, DO [facility]  01/05/2021, 8:00 AM      If 7PM-7AM, please contact night-coverage. How to contact the [facility] Attending or Consulting provider [place] or covering provider during after hours [place], for this patient?    Check the care team in [facility] and look for a) attending/consulting [facility] provider listed and b) the [facility] team listed Log into [email or web address] and use [place]'s universal password to access. If you do not  have the password, please contact the hospital operator. Locate the [facility] provider you are looking for under [facility] and page to a number that you can be directly  reached. If you still have difficulty reaching the provider, please page the [facility] (Director on Call) for the Hospitalists listed on amion for assistance.

## 2021-01-05 NOTE — Progress Notes (Addendum)
[facility], [place] 01/05/21  Subjective:   LOS: 7  Patient known to our practice from previous admissions. This time he is admitted for generalized weakness, loose stools which are dark in color.    Update:  Patient confused and disoriented this morning States he is cold along with incoherent mumbling.  Spouse at bedside She states "I cannot deal with this "   Objective:  Vital signs in last 24 hours:  Temp:  [97.6 F (36.4 C)-100.1 F (37.8 C)] 99.1 F (37.3 C) (11/30 1107) Pulse Rate:  [73-147] 87 (11/30 1107) Resp:  [17-28] 20 (11/30 1107) BP: (78-138)/(45-114) 103/52 (11/30 1107) SpO2:  [92 %-100 %] 98 % (11/30 1339) Weight:  [71.1 kg-71.5 kg] 71.5 kg (11/29 1815)  Weight change: -1.8 kg Filed Weights   01/03/21 0719 01/04/21 1430 01/04/21 1815  Weight: 72.9 kg 71.1 kg 71.5 kg    Intake/Output:    Intake/Output Summary (Last 24 hours) at 01/05/2021 1402 Last data filed at 01/05/2021 1052 Gross per 24 hour  Intake 350 ml  Output 0 ml  Net 350 ml      Physical Exam: General: Frail, elderly gentleman, laying in the bed  HEENT Anicteric, hearing intact.  Pulm/lungs Normal breathing effort, clear bilateral  CVS/Heart No rub or gallop  Abdomen:  soft, nontender  Extremities: No edema  Neurologic: Alert, disoriented  Skin: No acute rashes  Access: Left forearm AV fistula       Basic Metabolic Panel:  Recent Labs  Lab 12/31/20 0927 01/01/21 1523 01/02/21 0344 01/03/21 0505 01/04/21 0348 01/05/21 0619  NA 134* 131* 133* 132* 132* 137  K 3.7 3.3* 3.5 3.6 3.8 4.2  CL 97* 96* 98 98 96* 102  CO2 32 32 31 29 29 29   GLUCOSE 105* 89 114* 88 100* 39*  BUN 17 9 11 18  24* 25*  CREATININE 3.44* 2.30* 3.06* 4.32* 5.49* 4.05*  CALCIUM 7.7* 7.4* 7.6* 7.5* 7.6* 7.7*  PHOS 1.7*  --  1.2* 1.4* 1.7* 1.9*      CBC: Recent Labs  Lab 12/31/20 0927 01/01/21 1523 01/02/21 0344 01/03/21 0505 01/04/21 0348 01/05/21 0619  WBC  3.6* 3.7* 4.8 4.7 5.1 6.3  NEUTROABS 2.7 2.7 3.6 3.5 3.9  --   HGB 7.9* 8.3* 7.7* 7.7* 7.6* 7.1*  HCT 24.3* 25.3* 23.7* 23.5* 23.4* 22.2*  MCV 96.8 98.4 97.1 96.7 96.7 97.4  PLT 55* 56* 59* 60* 70* 62*       Lab Results  Component Value Date   HEPBSAG NON REACTIVE 12/29/2020   HEPBSAB Reactive (A) 11/13/2020      Microbiology:  Recent Results (from the past 240 hour(s))  Resp Panel by RT-PCR (Flu A&B, Covid) Nasopharyngeal Swab     Status: None   Collection Time: 12/20/2020 11:08 PM   Specimen: Nasopharyngeal Swab; Nasopharyngeal(NP) swabs in vial transport medium  Result Value Ref Range Status   SARS Coronavirus 2 by RT PCR NEGATIVE NEGATIVE Final    Comment: (NOTE) SARS-CoV-2 target nucleic acids are NOT DETECTED.  The SARS-CoV-2 RNA is generally detectable in upper respiratory specimens during the acute phase of infection. The lowest concentration of SARS-CoV-2 viral copies this assay can detect is 138 copies/mL. A negative result does not preclude SARS-Cov-2 infection and should not be used as the sole basis for treatment or other patient management decisions. A negative result may occur with  improper specimen collection/handling, submission of specimen other than nasopharyngeal swab, presence of viral mutation(s) within the areas  targeted by this assay, and inadequate number of viral copies(<138 copies/mL). A negative result must be combined with clinical observations, patient history, and epidemiological information. The expected result is Negative.  Fact Sheet for Patients:  EntrepreneurPulse.com.au  Fact Sheet for Healthcare Providers:  IncredibleEmployment.be  This test is no t yet approved or cleared by the [place] FDA and  has been authorized for detection and/or diagnosis of SARS-CoV-2 by FDA under an Emergency Use Authorization (EUA). This EUA will remain  in effect (meaning this test can be used) for the duration  of the COVID-19 declaration under Section 564(b)(1) of the Act, 21 U.S.C.section 360bbb-3(b)(1), unless the authorization is terminated  or revoked sooner.       Influenza A by PCR NEGATIVE NEGATIVE Final   Influenza B by PCR NEGATIVE NEGATIVE Final    Comment: (NOTE) The Xpert Xpress SARS-CoV-2/FLU/RSV plus assay is intended as an aid in the diagnosis of influenza from Nasopharyngeal swab specimens and should not be used as a sole basis for treatment. Nasal washings and aspirates are unacceptable for Xpert Xpress SARS-CoV-2/FLU/RSV testing.  Fact Sheet for Patients: EntrepreneurPulse.com.au  Fact Sheet for Healthcare Providers: IncredibleEmployment.be  This test is not yet approved or cleared by the [place] FDA and has been authorized for detection and/or diagnosis of SARS-CoV-2 by FDA under an Emergency Use Authorization (EUA). This EUA will remain in effect (meaning this test can be used) for the duration of the COVID-19 declaration under Section 564(b)(1) of the Act, 21 U.S.C. section 360bbb-3(b)(1), unless the authorization is terminated or revoked.  Performed at [facility], [address] [place], [place] [address]   Blood Culture (routine x 2)     Status: None   Collection Time: 12/22/2020 11:09 PM   Specimen: BLOOD LEFT FOREARM  Result Value Ref Range Status   Specimen Description BLOOD LEFT FOREARM  Final   Special Requests   Final    BOTTLES DRAWN AEROBIC AND ANAEROBIC Blood Culture adequate volume   Culture   Final    NO GROWTH 5 DAYS Performed at [facility], [address], [place], [place] [address]    Report Status 01/02/2021 FINAL  Final  Blood Culture (routine x 2)     Status: None   Collection Time: 12/14/2020 11:09 PM   Specimen: BLOOD RIGHT FOREARM  Result Value Ref Range Status   Specimen Description BLOOD RIGHT FOREARM  Final   Special Requests   Final    BOTTLES DRAWN AEROBIC AND  ANAEROBIC Blood Culture results may not be optimal due to an excessive volume of blood received in culture bottles   Culture   Final    NO GROWTH 5 DAYS Performed at [facility], [address], [place], [place] [address]    Report Status 01/02/2021 FINAL  Final    Coagulation Studies: No results for input(s): LABPROT, INR in the last 72 hours.   Urinalysis: No results for input(s): COLORURINE, LABSPEC, PHURINE, GLUCOSEU, HGBUR, BILIRUBINUR, KETONESUR, PROTEINUR, UROBILINOGEN, NITRITE, LEUKOCYTESUR in the last 72 hours.  Invalid input(s): APPERANCEUR    Imaging: CT HEAD WO CONTRAST (5MM)  Result Date: 01/05/2021 CLINICAL DATA:  Delirium. Recent fall with head injury approximately 12/07/2020. EXAM: CT HEAD WITHOUT CONTRAST TECHNIQUE: Contiguous axial images were obtained from the base of the skull through the vertex without intravenous contrast. COMPARISON:  CT head 12/07/2020 FINDINGS: Brain: Mild atrophy. Mild to moderate white matter hypoattenuation bilaterally. Negative for acute infarct, hemorrhage, mass. No change from the prior  study. Image quality degraded by mild motion. Vascular: Negative for hyperdense vessel Skull: Negative Sinuses/Orbits: Minimal mucosal edema paranasal sinuses. Bilateral cataract extraction. Resolving right frontal scalp hematoma since the prior study. Other: Image quality degraded by mild motion. IMPRESSION: Atrophy and chronic microvascular ischemia. No acute intracranial abnormality Electronically Signed   By: Franchot Gallo M.D.   On: 01/05/2021 12:52     Medications:      sodium chloride   Intravenous Once   Chlorhexidine Gluconate Cloth  6 each Topical Q0600   dicyclomine  10 mg Oral TID AC & HS   epoetin (EPOGEN/PROCRIT) injection  4,000 Units Intravenous Q T,Th,Sa-HD   escitalopram  20 mg Oral Daily   ipratropium-albuterol  3 mL Nebulization Q6H   loratadine  10 mg Oral Daily   midodrine  10 mg Oral TID WC   multivitamin  1 tablet  Oral QHS   OLANZapine  5 mg Oral QHS   pantoprazole  40 mg Oral BID   rOPINIRole  1.5 mg Oral QHS   tamsulosin  0.8 mg Oral Daily   acetaminophen **OR** acetaminophen, fluticasone, haloperidol lactate, melatonin, ondansetron **OR** ondansetron (ZOFRAN) IV  Assessment/ Plan:  85 y.o. male with   end stage renal disease on hemodialysis, hypotension, coronary artery disease status post CABG, hyperlipidemia, BPH, congestive heart failure, COPD, AAA status post stent, peptic ulcer disease was admitted on 01/02/2021 for  Principal Problem:   GI bleeding Active Problems:   Chronic combined systolic and diastolic heart failure ([name])   Hyperlipidemia   H/O coronary artery bypass surgery   Type II diabetes mellitus with renal manifestations ([name])   ESRD (end stage renal disease) ([name])   Pancytopenia ([name])   CAD (coronary artery disease)   Hypotension   Thrombocytopenia ([name])   H/O Clostridium difficile infection   Aorto-biiliac stent graft ([place])   PVD (peripheral vascular disease) ([place])   AAA (abdominal aortic aneurysm) without rupture   Diarrhea   Anemia of chronic disease   Chronic obstructive pulmonary disease ([place])   Weakness   Acute delirium  GI bleeding [K92.2]  [facility] TTS [facility] [address] Left AVF 72kg  #. ESRD Received dialysis yesterday, tolerated fair.  Patient required repeated redirection from trying to get out of bed and pulling at lines.  Patient did receive 1 unit of blood during his treatment.  Also received albumin 25 g for blood pressure support.  Next treatment scheduled for tomorrow.  Would recommend palliative consult due to recent change in mentation and voiced concerns from spouse.  #. Anemia of CKD and GI blood loss.  Lab Results  Component Value Date   HGB 7.1 (L) 01/05/2021  Hemoglobin remains low.  Patient receiving additional unit of blood this morning.  Patient received 1 unit of blood with dialysis yesterday.  Will increase to EPO 10000  units with dialysis treatments.  #. Secondary hyperparathyroidism of renal origin N 25.81   No results found for: PTH Lab Results  Component Value Date   PHOS 1.9 (L) 01/05/2021   Phosphorus remains low due to poor oral intake.  # Hypokalemia  Lab Results  Component Value Date   K 4.2 01/05/2021    Remained stable    LOS: [place] 11/30/20222:02 PM  [facility] [place], [place]

## 2021-01-05 NOTE — TOC Initial Note (Signed)
Transition of Care [facility]) - Initial/Assessment Note    Patient Details  Name: Brent Glover MRN: [id number] Date of Birth: 10-Jun-1935  Transition of Care [facility]) CM/SW Contact:    Eileen Stanford, LCSW Phone Number: 01/05/2021, 10:41 AM  Clinical Narrative:    Pt only alert to self  and time. CSW spoke with pt's Spouse and she is agreeable for pt to go to SNF. She would like for CSW to check on [facility] in [place] will send referral.           Expected Discharge Plan: [place] Barriers to Discharge: Continued Medical Work up   Patient Goals and CMS Choice Patient states their goals for this hospitalization and ongoing recovery are:: for pt to get better   Choice offered to / list presented to : Spouse  Expected Discharge Plan and Services Expected Discharge Plan: [place] In-house Referral: Clinical Social Work   Post Acute Care Choice: [place] Living arrangements for the past 2 months: [place]                                      Prior Living Arrangements/Services Living arrangements for the past 2 months: [place] Lives with:: Spouse Patient language and need for interpreter reviewed:: Yes Do you feel safe going back to the place where you live?: Yes      Need for Family Participation in Patient Care: Yes (Comment) Care giver support system in place?: Yes (comment)   Criminal Activity/Legal Involvement Pertinent to Current Situation/Hospitalization: No - Comment as needed  Activities of Daily Living Home Assistive Devices/Equipment: Cane (specify quad or straight), Walker (specify type), Wheelchair ADL Screening (condition at time of admission) Patient's cognitive ability adequate to safely complete daily activities?: Yes Is the patient deaf or have difficulty hearing?: No Does the patient have difficulty seeing, even when wearing glasses/contacts?: No Does the patient have  difficulty concentrating, remembering, or making decisions?: No Patient able to express need for assistance with ADLs?: Yes Does the patient have difficulty dressing or bathing?: No Independently performs ADLs?: Yes (appropriate for developmental age) Does the patient have difficulty walking or climbing stairs?: Yes Weakness of Legs: Both Weakness of Arms/Hands: None  Permission Sought/Granted Permission sought to share information with : Family Supports Permission granted to share information with : Yes, Release of Information Signed  Share Information with NAME: [name]  Permission granted to share info w AGENCY: [facility]  Permission granted to share info w Relationship: Spouse     Emotional Assessment Appearance:: Appears stated age Attitude/Demeanor/Rapport: Unable to Assess Affect (typically observed): Unable to Assess Orientation: : Oriented to  Time, Oriented to Self Alcohol / Substance Use: Not Applicable Psych Involvement: No (comment)  Admission diagnosis:  GI bleeding [K92.2] Patient Active Problem List   Diagnosis Date Noted   Acute delirium    Anemia of chronic disease    Chronic obstructive pulmonary disease ([place])    Weakness    Diarrhea    PVD (peripheral vascular disease) ([place]) 01/01/2021   AAA (abdominal aortic aneurysm) without rupture 01/01/2021   Aorto-biiliac stent graft ([place]) 12/31/2020   H/O Clostridium difficile infection 12/29/2020   GI bleeding 11/11/2020   Type II diabetes mellitus with renal manifestations ([place]) 11/11/2020   Depression 11/11/2020   ESRD (end stage renal disease) ([name])    Chronic systolic CHF (congestive heart failure) (  [place])    Pancytopenia ([place])    CAD (coronary artery disease)    Elevated troponin    Hypotension    Bradycardia    Thrombocytopenia ([name])    Anemia due to blood loss    Syncope and collapse 09/29/2017   Chronic kidney disease    CAD in native artery 11/23/2014   H/O coronary artery bypass surgery 10/28/2014   Acute  non-ST elevation myocardial infarction (NSTEMI) ([place]) 10/15/2014   Chronic kidney disease (CKD), stage III (moderate) ([name]) 03/17/2014   Creatinine elevation 03/11/2014   Chronic combined systolic and diastolic heart failure ([place]) 11/29/2012   Claudication ([place]) 06/25/2012   Chronic diastolic heart failure ([place])    Hypertension    Type 2 diabetes mellitus ([place]) 09/21/2011   Aneurysm ([place]) 10/07/2010   Arthritis, degenerative 09/09/2010   Hyperlipidemia 04/04/2009   Benign hypertension 09/04/2008   PCP:  Christen Bame, DO Pharmacy:   [place], [place] [address] Phone: [phone] Fax: [phone]     Social Determinants of Health (SDOH) Interventions    Readmission Risk Interventions Readmission Risk Prevention Plan 11/13/2020  Transportation Screening Complete  PCP or Specialist Appt within 5-7 Days Complete  Home Care Screening Complete  Medication Review (RN CM) Complete  Some recent data might be hidden

## 2021-01-05 NOTE — Progress Notes (Signed)
Hypoglycemic Event  CBG: 27  Treatment: D50 36mL given  Symptoms: Delirious  Follow-up CBG: Time: 1617 CBG Result: 91  Possible Reasons for Event: Poor PO intake  Comments/MD notified:Dr. Arbutus Ped notified, added continuous dextrose 5%    Melonie [place]

## 2021-01-05 NOTE — TOC Progression Note (Signed)
Transition of Care [facility]) - Progression Note    Patient Details  Name: Brent Glover MRN: [id number] Date of Birth: 1935-05-09  [facility]) CM/SW Contact  Eileen Stanford, LCSW Phone Number: 01/05/2021, 2:26 PM  Clinical Narrative:   [facility] made bed offer. Pt's spouse notified.    Expected Discharge Plan: [place] Barriers to Discharge: Continued Medical Work up  Expected Discharge Plan and Services Expected Discharge Plan: [place] In-house Referral: Clinical Social Work   Post Acute Care Choice: [place] Living arrangements for the past 2 months: [place] Determinants of Health (SDOH) Interventions    Readmission Risk Interventions Readmission Risk Prevention Plan 11/13/2020  Transportation Screening Complete  PCP or Specialist Appt within 5-7 Days Complete  Home Care Screening Complete  Medication Review (RN CM) Complete  Some recent data might be hidden

## 2021-01-05 NOTE — Evaluation (Signed)
Occupational Therapy Evaluation Patient Details Name: Brent Glover MRN: [id number] DOB: 1935-10-15 Today's Date: 01/05/2021   History of Present Illness Pt admitted to [facility] on 01/05/2021 for c/o weakness and dark stool, found to have symptomatic anemia and dehydration. GI consulted due to concern for GI bleed as pt was recently admitted for GI bleed and anemia. Per GI consult, no intervention needed. PMH significant for: chronic diastolic CHF, COPD, T6OM, CAD s/p CABG, ESRD (HD), HTN, dyslipidemia. Imaging revealed increased vascular congestion without interstitial pulmonary edema.   Clinical Impression   Patient presenting with decreased Ind in self care, balance, functional mobility/transfers, endurance, and safety awareness. Patient with very tangential speech throughout and mumbling to himself. Pt does say, " I am cold" but no response to questions. Home set up information obtained from chart and staff. Per chart, pt uses cane and rollator for functional mobility. Self care performed independently with family assisting with IADLS. Pt found to be covered in bloody stool. RN called to assess. Pt needing max A for rolling L <> R and total A for hygiene. Pt repositioned in bed with all needs within reach and RN present as therapist exits. Patient will benefit from acute OT to increase overall independence in the areas of ADLs, functional mobility, and safety awareness in order to safely discharge to next venue of care.      Recommendations for follow up therapy are one component of a multi-disciplinary discharge planning process, led by the attending physician.  Recommendations may be updated based on patient status, additional functional criteria and insurance authorization.   Follow Up Recommendations  Skilled nursing-short term rehab (<3 hours/day)    Assistance Recommended at Discharge Frequent or constant Supervision/Assistance  Functional Status Assessment  Patient has had a recent  decline in their functional status and demonstrates the ability to make significant improvements in function in a reasonable and predictable amount of time.  Equipment Recommendations  Other (comment) (defer to next venue of care)       Precautions / Restrictions Precautions Precautions: Fall      Mobility Bed Mobility Overal bed mobility: Needs Assistance Bed Mobility: Rolling Rolling: Max assist              Transfers                   General transfer comment: unsafe at this time          ADL either performed or assessed with clinical judgement   ADL Overall ADL's : Needs assistance/impaired                                       General ADL Comments: total A for hygiene and to change linens. Pt unable to follow commands without hand over hand assistance. Rolling L <> R with max A.     Vision Patient Visual Report: No change from baseline              Pertinent Vitals/Pain Pain Assessment: Faces Faces Pain Scale: Hurts little more Pain Location: buttocks Pain Descriptors / Indicators: Discomfort Pain Intervention(s): Monitored during session;Repositioned        Extremity/Trunk Assessment Upper Extremity Assessment Upper Extremity Assessment: Generalized weakness   Lower Extremity Assessment Lower Extremity Assessment: Generalized weakness          Cognition Arousal/Alertness: Awake/alert Behavior During Therapy: Restless;Anxious Overall Cognitive Status: No family/caregiver present to determine  baseline cognitive functioning                                 General Comments: Pt with very tangential speech. Oriented to self only. Keeping eyes closed often.                Home Living Family/patient expects to be discharged to:: Private residence Living Arrangements: Spouse/significant other;Children Available Help at Discharge: Family;Available 24 hours/day Type of Home: House Home Access: Stairs to  enter [employer] of Steps: 3 (from garage) Entrance Stairs-Rails: Right Home Layout: One level     Bathroom Shower/Tub: Walk-in shower         Home Equipment: Merchant navy officer (4 wheels);Rolling Walker (2 wheels);Shower seat - built in;Grab bars - tub/shower;Grab bars - toilet          Prior Functioning/Environment Prior Level of Function : Independent/Modified Independent;Needs assist;History of Falls (last six months)       Physical Assist : ADLs (physical)   ADLs (physical): IADLs Mobility Comments: Pt reports using QC for limited household ambulation, and rollator for limited community ambulation. Per chart review, pt ambulates 50-124ft at a time. Pt states he was active with HHPT prior to hospital admission, with PT coming once a week. Also states an RN was coming to assist at home, but couldn't state what they assisted with. ADLs Comments: Pt reports being Ind with ADL's and requiring assist for IADL's and medication management. Information obtained via chart review and staff report.        OT Problem List: Decreased strength;Decreased activity tolerance;Impaired balance (sitting and/or standing);Decreased safety awareness      OT Treatment/Interventions: Self-care/ADL training;Therapeutic exercise;Therapeutic activities;Energy conservation;DME and/or AE instruction;Patient/family education;Manual therapy;Balance training    OT Goals(Current goals can be found in the care plan section) Acute Rehab OT Goals Patient Stated Goal: "I am cold" OT Goal Formulation: With patient Time For Goal Achievement: 01/19/21  OT Frequency: Min 2X/week   Barriers to D/C: Other (comment)  pt needing increased level of assistance          AM-PAC OT "6 Clicks" Daily Activity     Outcome Measure Help from another person eating meals?: None Help from another person taking care of personal grooming?: A Little Help from another person toileting, which includes using  toliet, bedpan, or urinal?: Total Help from another person bathing (including washing, rinsing, drying)?: A Lot Help from another person to put on and taking off regular upper body clothing?: A Lot Help from another person to put on and taking off regular lower body clothing?: Total 6 Click Score: 13   End of Session Equipment Utilized During Treatment: Oxygen Nurse Communication: Mobility status;Other (comment) (bloody stool)  Activity Tolerance: Patient limited by fatigue Patient left: in bed;with call bell/phone within reach;with nursing/sitter in room;with bed alarm set  OT Visit Diagnosis: Unsteadiness on feet (R26.81);Muscle weakness (generalized) (M62.81);History of falling (Z91.81)                Time: 7782-4235 OT Time Calculation (min): 18 min Charges:  OT General Charges $OT Visit: 1 Visit OT Evaluation $OT Eval Moderate Complexity: 1 Mod OT Treatments $Self Care/Home Management : 8-22 mins  Darleen Crocker, MS, OTR/L , CBIS ascom [phone]  01/05/21, 10:30 AM

## 2021-01-05 NOTE — Progress Notes (Signed)
   01/05/21 1520  Assess: MEWS Score  Temp (!) 102 F (38.9 C)  BP (!) 125/97  Pulse Rate 80  ECG Heart Rate 80  Resp (!) 37  SpO2 97 %  O2 Device Nasal Cannula  O2 Flow Rate (L/min) 5 L/min  Assess: MEWS Score  MEWS Temp 2  MEWS Systolic 0  MEWS Pulse 0  MEWS RR 3  MEWS LOC 0  MEWS Score 5  MEWS Score Color Red  Assess: if the MEWS score is Yellow or Red  Were vital signs taken at a resting state? Yes  Focused Assessment No change from prior assessment  Does the patient meet 2 or more of the SIRS criteria? Yes  Does the patient have a confirmed or suspected source of infection? Yes  Provider and Rapid Response Notified? Yes  MEWS guidelines implemented *See Row Information* Yes  Treat  MEWS Interventions Escalated (See documentation below)  Take Vital Signs  Increase Vital Sign Frequency  Red: Q 1hr X 4 then Q 4hr X 4, if remains red, continue Q 4hrs  Escalate  MEWS: Escalate Red: discuss with charge nurse/RN and provider, consider discussing with RRT  Notify: Charge Nurse/RN  Name of Charge Nurse/RN Notified [place], RN  Date Charge Nurse/RN Notified 01/05/21  Time Charge Nurse/RN Notified 1600  Notify: Provider  Provider Name/Title Dr. Arbutus Ped  Date Provider Notified 01/05/21  Time Provider Notified 1600  Notification Type Page  Notification Reason Critical result  Provider response See new orders  Date of Provider Response 01/05/21  Time of Provider Response 1600  Document  Patient Outcome Stabilized after interventions  Progress note created (see row info) Yes  Assess: SIRS CRITERIA  SIRS Temperature  1  SIRS Pulse 0  SIRS Respirations  1  SIRS WBC 0  SIRS Score Sum  2

## 2021-01-06 ENCOUNTER — Inpatient Hospital Stay: Payer: Medicare Other

## 2021-01-06 DIAGNOSIS — R197 Diarrhea, unspecified: Secondary | ICD-10-CM | POA: Diagnosis not present

## 2021-01-06 DIAGNOSIS — J9601 Acute respiratory failure with hypoxia: Secondary | ICD-10-CM

## 2021-01-06 DIAGNOSIS — Z951 Presence of aortocoronary bypass graft: Secondary | ICD-10-CM

## 2021-01-06 DIAGNOSIS — R410 Disorientation, unspecified: Secondary | ICD-10-CM

## 2021-01-06 DIAGNOSIS — I5042 Chronic combined systolic (congestive) and diastolic (congestive) heart failure: Secondary | ICD-10-CM | POA: Diagnosis not present

## 2021-01-06 DIAGNOSIS — K921 Melena: Secondary | ICD-10-CM | POA: Diagnosis not present

## 2021-01-06 DIAGNOSIS — I739 Peripheral vascular disease, unspecified: Secondary | ICD-10-CM

## 2021-01-06 DIAGNOSIS — D638 Anemia in other chronic diseases classified elsewhere: Secondary | ICD-10-CM | POA: Diagnosis not present

## 2021-01-06 DIAGNOSIS — R531 Weakness: Secondary | ICD-10-CM | POA: Diagnosis not present

## 2021-01-06 DIAGNOSIS — R0602 Shortness of breath: Secondary | ICD-10-CM

## 2021-01-06 DIAGNOSIS — Z66 Do not resuscitate: Secondary | ICD-10-CM

## 2021-01-06 DIAGNOSIS — N186 End stage renal disease: Secondary | ICD-10-CM | POA: Diagnosis not present

## 2021-01-06 LAB — BPAM RBC
Blood Product Expiration Date: 202212292359
Blood Product Expiration Date: 202301012359
ISSUE DATE / TIME: 202211291501
ISSUE DATE / TIME: 202211301040
Unit Type and Rh: 5100
Unit Type and Rh: 5100

## 2021-01-06 LAB — GLUCOSE, CAPILLARY
Glucose-Capillary: 117 mg/dL — ABNORMAL HIGH (ref 70–99)
Glucose-Capillary: 28 mg/dL — CL (ref 70–99)
Glucose-Capillary: 76 mg/dL (ref 70–99)
Glucose-Capillary: 87 mg/dL (ref 70–99)
Glucose-Capillary: 92 mg/dL (ref 70–99)
Glucose-Capillary: 94 mg/dL (ref 70–99)

## 2021-01-06 LAB — CBC
HCT: 23.6 % — ABNORMAL LOW (ref 39.0–52.0)
Hemoglobin: 7.6 g/dL — ABNORMAL LOW (ref 13.0–17.0)
MCH: 30.6 pg (ref 26.0–34.0)
MCHC: 32.2 g/dL (ref 30.0–36.0)
MCV: 95.2 fL (ref 80.0–100.0)
Platelets: 66 10*3/uL — ABNORMAL LOW (ref 150–400)
RBC: 2.48 MIL/uL — ABNORMAL LOW (ref 4.22–5.81)
RDW: 19.3 % — ABNORMAL HIGH (ref 11.5–15.5)
WBC: 7.6 10*3/uL (ref 4.0–10.5)
nRBC: 0 % (ref 0.0–0.2)

## 2021-01-06 LAB — BLOOD CULTURE ID PANEL (REFLEXED) - BCID2

## 2021-01-06 LAB — BLOOD GAS, ARTERIAL
Acid-Base Excess: 10.2 mmol/L — ABNORMAL HIGH (ref 0.0–2.0)
Bicarbonate: 33 mmol/L — ABNORMAL HIGH (ref 20.0–28.0)
Delivery systems: POSITIVE
Expiratory PAP: 6
FIO2: 0.4
Inspiratory PAP: 12
O2 Saturation: 99.4 %
Patient temperature: 37
pCO2 arterial: 36 mmHg (ref 32.0–48.0)
pH, Arterial: 7.57 — ABNORMAL HIGH (ref 7.350–7.450)
pO2, Arterial: 139 mmHg — ABNORMAL HIGH (ref 83.0–108.0)

## 2021-01-06 LAB — TYPE AND SCREEN
ABO/RH(D): O POS
Antibody Screen: NEGATIVE
Unit division: 0
Unit division: 0

## 2021-01-06 LAB — COMPREHENSIVE METABOLIC PANEL
ALT: 39 U/L (ref 0–44)
AST: 162 U/L — ABNORMAL HIGH (ref 15–41)
Albumin: 2.1 g/dL — ABNORMAL LOW (ref 3.5–5.0)
Alkaline Phosphatase: 86 U/L (ref 38–126)
Anion gap: 3 — ABNORMAL LOW (ref 5–15)
BUN: 40 mg/dL — ABNORMAL HIGH (ref 8–23)
CO2: 31 mmol/L (ref 22–32)
Calcium: 7.9 mg/dL — ABNORMAL LOW (ref 8.9–10.3)
Chloride: 103 mmol/L (ref 98–111)
Creatinine, Ser: 5.18 mg/dL — ABNORMAL HIGH (ref 0.61–1.24)
GFR, Estimated: 10 mL/min — ABNORMAL LOW (ref >60)
Glucose, Bld: 93 mg/dL (ref 70–99)
Potassium: 4.6 mmol/L (ref 3.5–5.1)
Sodium: 137 mmol/L (ref 135–145)
Total Bilirubin: 1.2 mg/dL (ref 0.3–1.2)
Total Protein: 5.8 g/dL — ABNORMAL LOW (ref 6.5–8.1)

## 2021-01-06 LAB — GLUCOSE, RANDOM: Glucose, Bld: 61 mg/dL — ABNORMAL LOW (ref 70–99)

## 2021-01-06 LAB — LACTIC ACID, PLASMA: Lactic Acid, Venous: 1.2 mmol/L (ref 0.5–1.9)

## 2021-01-06 MED ORDER — POLYVINYL ALCOHOL 1.4 % OP SOLN
1.0000 [drp] | Freq: Four times a day (QID) | OPHTHALMIC | Status: DC | PRN
  Filled 2021-01-06: qty 15

## 2021-01-06 MED ORDER — GLYCOPYRROLATE 1 MG PO TABS: 1.0000 mg | ORAL_TABLET | ORAL | Status: DC | PRN

## 2021-01-06 MED ORDER — GLYCOPYRROLATE 0.2 MG/ML IJ SOLN: 0.2000 mg | INTRAMUSCULAR | Status: DC | PRN

## 2021-01-06 MED ORDER — HALOPERIDOL 0.5 MG PO TABS: 0.5000 mg | ORAL_TABLET | ORAL | Status: DC | PRN

## 2021-01-06 MED ORDER — PANTOPRAZOLE SODIUM 40 MG IV SOLR
40.0000 mg | Freq: Two times a day (BID) | INTRAVENOUS | Status: DC
  Administered 2021-01-06: 40 mg via INTRAVENOUS
  Filled 2021-01-06: qty 40

## 2021-01-06 MED ORDER — LORAZEPAM 2 MG/ML IJ SOLN
1.0000 mg | INTRAMUSCULAR | Status: DC | PRN
  Administered 2021-01-06: 1 mg via INTRAVENOUS
  Filled 2021-01-06: qty 1

## 2021-01-06 MED ORDER — DEXTROSE 50 % IV SOLN
INTRAVENOUS | Status: AC
  Administered 2021-01-06: 50 mL
  Filled 2021-01-06: qty 50

## 2021-01-06 MED ORDER — MORPHINE BOLUS VIA INFUSION
1.0000 mg | INTRAVENOUS | Status: DC | PRN
  Administered 2021-01-06: 1 mg via INTRAVENOUS
  Filled 2021-01-06: qty 1

## 2021-01-06 MED ORDER — MORPHINE SULFATE (PF) 2 MG/ML IV SOLN
2.0000 mg | INTRAVENOUS | Status: DC | PRN
  Administered 2021-01-06 (×2): 2 mg via INTRAVENOUS
  Filled 2021-01-06 (×2): qty 1

## 2021-01-06 MED ORDER — MORPHINE SULFATE (PF) 2 MG/ML IV SOLN
2.0000 mg | INTRAVENOUS | Status: DC | PRN
  Administered 2021-01-06: 2 mg via INTRAVENOUS
  Filled 2021-01-06: qty 1

## 2021-01-06 MED ORDER — MORPHINE 100MG IN NS 100ML (1MG/ML) PREMIX INFUSION
1.0000 mg/h | INTRAVENOUS | Status: DC
  Administered 2021-01-06: 3 mg/h via INTRAVENOUS
  Filled 2021-01-06: qty 100

## 2021-01-06 MED ORDER — GLYCOPYRROLATE 0.2 MG/ML IJ SOLN
0.2000 mg | INTRAMUSCULAR | Status: DC | PRN
  Administered 2021-01-06 (×2): 0.2 mg via INTRAVENOUS
  Filled 2021-01-06 (×3): qty 1

## 2021-01-06 MED ORDER — HALOPERIDOL LACTATE 5 MG/ML IJ SOLN
1.0000 mg | Freq: Four times a day (QID) | INTRAMUSCULAR | Status: DC | PRN
  Administered 2021-01-06: 1 mg via INTRAVENOUS
  Filled 2021-01-06: qty 1

## 2021-01-06 MED ORDER — HALOPERIDOL LACTATE 5 MG/ML IJ SOLN: 0.5000 mg | INTRAMUSCULAR | Status: DC | PRN

## 2021-01-06 MED ORDER — HALOPERIDOL LACTATE 2 MG/ML PO CONC: 0.5000 mg | ORAL | Status: DC | PRN

## 2021-01-06 DEATH — deceased

## 2021-01-09 LAB — CULTURE, BLOOD (ROUTINE X 2): Special Requests: ADEQUATE

## 2021-01-11 LAB — CULTURE, BLOOD (ROUTINE X 2)
Culture: NO GROWTH
Special Requests: ADEQUATE

## 2021-02-06 NOTE — Progress Notes (Signed)
The patient had a hypoglycemic event at 2100. After being treated for this, he had multiple bloody stools followed by another hypoglycemic event. There was concern for the patients increased work of breathing and being unable to maintain sufficient 02 Sats. Ultimately the patient was transferred to a higher level of care.

## 2021-02-06 NOTE — Progress Notes (Signed)
[place] 86 y.o. male with   end stage renal disease on hemodialysis (TTS), hypotension, coronary artery disease status post CABG, hyperlipidemia, BPH, combined systolic and diastolic congestive heart failure, COPD, AAA status post stent, peptic ulcer disease pancytopenia, DM, and PVD admitted on 12/14/2020 for gastrointestinal bleeding concerns.  During late evening 11/30 patient with repeated hypoglycemic events, dark bloody bowel movents and significant crackles with increasing oxygen requirements per RN report.  Per chart review, total 3 units PRBCs have been give HD on 11/29 without fluid removal. CXR on 11/30 1625 with cardiomegaly with vascular congestion and interstitial pulmonary edema. Oxygen requirement increase from 4 liters to 6 liters HFNC Course crackles thoroughout both lung fields with increased work of breathing Patient transferred to unit and BIPAP ordered for asistance with work of breathing.   Assess Neuro, no eye contact with name called. Lower extremities in obligatory flexed position with stiffness/resistance to active straightening. Non verbal, no commands followed CV  SR, murmur, JVD, significant edema in left lower leg compared to right. Painful to palpation. Modified Wells score for DVT 4 Resp - crackles throughout. Abdominal accessory muscle use Initial ABG however showed significant metabolic alkalosis. Bipap mode changed to CPAP.  Endocrine - cbg peripheral hypoglycemic events confirmed as accurate with serum glucose   Acute on chronic pulmonary congestion secondary to ESRD and acute on chronic heart failure  - supportive respiratory care - CPAP -larger volume fluid removal with HD requested for today   Hypoglycemia -organ vascular congestion and failure in patient with chonic comorbididties and poor oral intake. Continue D10 at 72 and monitor cbg often  Left lower extremity edema  Ultrasound negative for DVT Continue supportive care  Family - wife updated  via phone with patient transfer to ICU

## 2021-02-06 NOTE — Progress Notes (Signed)
[facility], [place] 01-08-2021  Subjective:   LOS: 8  Patient known to our practice from previous admissions. This time he is admitted for generalized weakness, loose stools which are dark in color.    Update:  Patient with worsening status over the past 24 hours. Patient moved over to the critical care unit. Currently on BiPAP. Hands are in mitten restraints. Patient due for dialysis treatment today.    Objective:  Vital signs in last 24 hours:  Temp:  [98.3 F (36.8 C)-102 F (38.9 C)] 99.9 F (37.7 C) (12/01 0732) Pulse Rate:  [65-87] 74 (12/01 0900) Resp:  [16-37] 16 (12/01 0900) BP: (91-152)/(44-109) 117/47 (12/01 0900) SpO2:  [58 %-100 %] 100 % (12/01 0900) FiO2 (%):  [30 %-40 %] 30 % (12/01 0732) Weight:  [73.7 kg] 73.7 kg (12/01 0103)  Weight change: 2.6 kg Filed Weights   01/04/21 1430 01/04/21 1815 08-Jan-2021 0103  Weight: 71.1 kg 71.5 kg 73.7 kg    Intake/Output:    Intake/Output Summary (Last 24 hours) at 01/08/2021 0924 Last data filed at 01-08-2021 0600 Gross per 24 hour  Intake 1103.81 ml  Output 0 ml  Net 1103.81 ml      Physical Exam: General: Critically ill-appearing, on BiPAP  HEENT Anicteric, hearing intact.  BiPAP facemask on  Pulm/lungs Scattered rhonchi, normal effort  CVS/Heart S1S2 no rubs  Abdomen:  soft, nontender, bowel sounds present  Extremities: No edema  Neurologic: Arousable but disoriented.  Follows simple commands  Skin: No acute rashes  Access: Left forearm AV fistula       Basic Metabolic Panel:  Recent Labs  Lab 12/31/20 0927 01/01/21 1523 01/02/21 0344 01/03/21 0505 01/04/21 0348 01/05/21 0619 01/05/21 2325 01/08/2021 0522  NA 134*   < > 133* 132* 132* 137  --  137  K 3.7   < > 3.5 3.6 3.8 4.2  --  4.6  CL 97*   < > 98 98 96* 102  --  103  CO2 32   < > 31 29 29 29   --  31  GLUCOSE 105*   < > 114* 88 100* 39* 61* 93  BUN 17   < > 11 18 24* 25*  --  40*  CREATININE 3.44*   < >  3.06* 4.32* 5.49* 4.05*  --  5.18*  CALCIUM 7.7*   < > 7.6* 7.5* 7.6* 7.7*  --  7.9*  PHOS 1.7*  --  1.2* 1.4* 1.7* 1.9*  --   --    < > = values in this interval not displayed.      CBC: Recent Labs  Lab 12/31/20 0927 01/01/21 1523 01/02/21 0344 01/03/21 0505 01/04/21 0348 01/05/21 0619 01/05/21 1631 01/05/21 2325 01/08/21 0522  WBC 3.6* 3.7* 4.8 4.7 5.1 6.3  --   --  7.6  NEUTROABS 2.7 2.7 3.6 3.5 3.9  --   --   --   --   HGB 7.9* 8.3* 7.7* 7.7* 7.6* 7.1* 8.0* 7.9* 7.6*  HCT 24.3* 25.3* 23.7* 23.5* 23.4* 22.2* 24.2* 24.5* 23.6*  MCV 96.8 98.4 97.1 96.7 96.7 97.4  --   --  95.2  PLT 55* 56* 59* 60* 70* 62*  --   --  66*       Lab Results  Component Value Date   HEPBSAG NON REACTIVE 12/29/2020   HEPBSAB Reactive (A) 11/13/2020      Microbiology:  Recent Results (from the past 240 hour(s))  Resp Panel  by RT-PCR (Flu A&B, Covid) Nasopharyngeal Swab     Status: None   Collection Time: 12/12/2020 11:08 PM   Specimen: Nasopharyngeal Swab; Nasopharyngeal(NP) swabs in vial transport medium  Result Value Ref Range Status   SARS Coronavirus 2 by RT PCR NEGATIVE NEGATIVE Final    Comment: (NOTE) SARS-CoV-2 target nucleic acids are NOT DETECTED.  The SARS-CoV-2 RNA is generally detectable in upper respiratory specimens during the acute phase of infection. The lowest concentration of SARS-CoV-2 viral copies this assay can detect is 138 copies/mL. A negative result does not preclude SARS-Cov-2 infection and should not be used as the sole basis for treatment or other patient management decisions. A negative result may occur with  improper specimen collection/handling, submission of specimen other than nasopharyngeal swab, presence of viral mutation(s) within the areas targeted by this assay, and inadequate number of viral copies(<138 copies/mL). A negative result must be combined with clinical observations, patient history, and epidemiological information. The expected  result is Negative.  Fact Sheet for Patients:  EntrepreneurPulse.com.au  Fact Sheet for Healthcare Providers:  IncredibleEmployment.be  This test is no t yet approved or cleared by the [place] FDA and  has been authorized for detection and/or diagnosis of SARS-CoV-2 by FDA under an Emergency Use Authorization (EUA). This EUA will remain  in effect (meaning this test can be used) for the duration of the COVID-19 declaration under Section 564(b)(1) of the Act, 21 U.S.C.section 360bbb-3(b)(1), unless the authorization is terminated  or revoked sooner.       Influenza A by PCR NEGATIVE NEGATIVE Final   Influenza B by PCR NEGATIVE NEGATIVE Final    Comment: (NOTE) The Xpert Xpress SARS-CoV-2/FLU/RSV plus assay is intended as an aid in the diagnosis of influenza from Nasopharyngeal swab specimens and should not be used as a sole basis for treatment. Nasal washings and aspirates are unacceptable for Xpert Xpress SARS-CoV-2/FLU/RSV testing.  Fact Sheet for Patients: EntrepreneurPulse.com.au  Fact Sheet for Healthcare Providers: IncredibleEmployment.be  This test is not yet approved or cleared by the [place] FDA and has been authorized for detection and/or diagnosis of SARS-CoV-2 by FDA under an Emergency Use Authorization (EUA). This EUA will remain in effect (meaning this test can be used) for the duration of the COVID-19 declaration under Section 564(b)(1) of the Act, 21 U.S.C. section 360bbb-3(b)(1), unless the authorization is terminated or revoked.  Performed at [facility], [address] [place], [place] [address]   Blood Culture (routine x 2)     Status: None   Collection Time: 01/05/2021 11:09 PM   Specimen: BLOOD LEFT FOREARM  Result Value Ref Range Status   Specimen Description BLOOD LEFT FOREARM  Final   Special Requests   Final    BOTTLES DRAWN AEROBIC AND ANAEROBIC Blood  Culture adequate volume   Culture   Final    NO GROWTH 5 DAYS Performed at [facility], [address] [place], [place] [address]    Report Status 01/02/2021 FINAL  Final  Blood Culture (routine x 2)     Status: None   Collection Time: 12/25/2020 11:09 PM   Specimen: BLOOD RIGHT FOREARM  Result Value Ref Range Status   Specimen Description BLOOD RIGHT FOREARM  Final   Special Requests   Final    BOTTLES DRAWN AEROBIC AND ANAEROBIC Blood Culture results may not be optimal due to an excessive volume of blood received in culture bottles   Culture   Final    NO GROWTH 5  DAYS Performed at [facility], [address] [place],  [address]    Report Status 01/02/2021 FINAL  Final    Coagulation Studies: No results for input(s): LABPROT, INR in the last 72 hours.   Urinalysis: No results for input(s): COLORURINE, LABSPEC, PHURINE, GLUCOSEU, HGBUR, BILIRUBINUR, KETONESUR, PROTEINUR, UROBILINOGEN, NITRITE, LEUKOCYTESUR in the last 72 hours.  Invalid input(s): APPERANCEUR    Imaging: CT HEAD WO CONTRAST (5MM)  Result Date: 01/05/2021 CLINICAL DATA:  Delirium. Recent fall with head injury approximately 12/07/2020. EXAM: CT HEAD WITHOUT CONTRAST TECHNIQUE: Contiguous axial images were obtained from the base of the skull through the vertex without intravenous contrast. COMPARISON:  CT head 12/07/2020 FINDINGS: Brain: Mild atrophy. Mild to moderate white matter hypoattenuation bilaterally. Negative for acute infarct, hemorrhage, mass. No change from the prior study. Image quality degraded by mild motion. Vascular: Negative for hyperdense vessel Skull: Negative Sinuses/Orbits: Minimal mucosal edema paranasal sinuses. Bilateral cataract extraction. Resolving right frontal scalp hematoma since the prior study. Other: Image quality degraded by mild motion. IMPRESSION: Atrophy and chronic microvascular ischemia. No acute intracranial abnormality Electronically Signed   By:  Franchot Gallo M.D.   On: 01/05/2021 12:52   US Venous Img Lower Unilateral Left (DVT)  Result Date: 2021-01-18 CLINICAL DATA:  Left lower extremity swelling. EXAM: LEFT LOWER EXTREMITY VENOUS DOPPLER ULTRASOUND TECHNIQUE: Gray-scale sonography with compression, as well as color and duplex ultrasound, were performed to evaluate the deep venous system(s) from the level of the common femoral vein through the popliteal and proximal calf veins. COMPARISON:  None. FINDINGS: VENOUS Normal compressibility of the common femoral, superficial femoral, and popliteal veins, as well as the visualized calf veins. Visualized portions of profunda femoral vein and great saphenous vein unremarkable. No filling defects to suggest DVT on grayscale or color Doppler imaging. Doppler waveforms show normal direction of venous flow, normal respiratory plasticity and response to augmentation. Limited views of the contralateral common femoral vein are unremarkable. OTHER None. Limitations: none IMPRESSION: Negative. Electronically Signed   By: Telford Nab M.D.   On: 2021/01/18 05:35   DG Chest Port 1 View  Result Date: 01/05/2021 CLINICAL DATA:  Fever weakness EXAM: PORTABLE CHEST 1 VIEW COMPARISON:  01/01/2021, 12/30/2020, 12/07/2020 FINDINGS: Cardiomegaly with vascular congestion and mild interstitial pulmonary edema. No consolidative airspace disease. Aortic atherosclerosis. No pneumothorax. IMPRESSION: Cardiomegaly with vascular congestion and interstitial pulmonary edema. No focal airspace disease. Electronically Signed   By: Donavan Foil M.D.   On: 01/05/2021 16:25     Medications:    dextrose 50 mL/hr at 01/18/2021 0100     sodium chloride   Intravenous Once   Chlorhexidine Gluconate Cloth  6 each Topical Q0600   dicyclomine  10 mg Oral TID AC & HS   epoetin (EPOGEN/PROCRIT) injection  4,000 Units Intravenous Q T,Th,Sa-HD   escitalopram  20 mg Oral Daily   ipratropium-albuterol  3 mL Nebulization Q6H    loratadine  10 mg Oral Daily   midodrine  10 mg Oral TID WC   multivitamin  1 tablet Oral QHS   OLANZapine  5 mg Oral QHS   pantoprazole  40 mg Oral BID   rOPINIRole  1.5 mg Oral QHS   tamsulosin  0.8 mg Oral Daily   acetaminophen **OR** acetaminophen, fluticasone, haloperidol lactate, melatonin, ondansetron **OR** ondansetron (ZOFRAN) IV  Assessment/ Plan:  86 y.o. male with   end stage renal disease on hemodialysis, hypotension, coronary artery disease status post CABG, hyperlipidemia, BPH, congestive heart failure, COPD,  AAA status post stent, peptic ulcer disease was admitted on 12/26/2020 for  Principal Problem:   GI bleeding Active Problems:   Chronic combined systolic and diastolic heart failure ([name])   Hyperlipidemia   H/O coronary artery bypass surgery   Type II diabetes mellitus with renal manifestations ([name])   ESRD (end stage renal disease) ([name])   Pancytopenia ([name])   CAD (coronary artery disease)   Hypotension   Thrombocytopenia ([name])   H/O Clostridium difficile infection   Aorto-biiliac stent graft ([place])   PVD (peripheral vascular disease) ([place])   AAA (abdominal aortic aneurysm) without rupture   Diarrhea   Anemia of chronic disease   Chronic obstructive pulmonary disease ([place])   Weakness   Acute delirium  GI bleeding [K92.2]  [facility] TTS [facility] [address] Left AVF 72kg  #. ESRD Patient moved over to the critical care unit.  Therefore we will need to perform hemodialysis treatment in the bed.  Patient with relative hypotension.  Continue midodrine.  #. Anemia of CKD and GI blood loss.  Lab Results  Component Value Date   HGB 7.6 (L) 01-16-2021  Hemoglobin currently 7.6 post transfusion.  Continue to monitor CBC and maintain the patient on Epogen at this time.  #. Secondary hyperparathyroidism of renal origin N 25.81   No results found for: PTH Lab Results  Component Value Date   PHOS 1.9 (L) 01/05/2021   Phosphorus remains low due to  poor oral intake.  May need to consider repletion.  # Hypokalemia  Lab Results  Component Value Date   K 4.6 2021-01-16  Potassium acceptable at 4.6 at the moment.  #Acute respiratory failure. Currently maintained on BiPAP.    LOS: 8 Brent Glover 12/11/20229:24 AM  [facility] [place], [place]

## 2021-02-06 NOTE — Progress Notes (Signed)
PROGRESS NOTE    Brent Glover   WTU:[id number]  DOB: 01-Jun-1935  PCP: Christen Bame, DO    DOA: 12/31/2020 LOS: 8    Brief Narrative / Hospital Course to Date:   86 year old male with past medical history of ESRD on hemodialysis, hyperlipidemia, hypertension, COPD, chronic diastolic heart failure and BPH was sent to the hospital with generalized weakness.  He was found to have a GI bleed with acute blood loss anemia and underlying anemia of chronic disease.  GI was consulted, did not want to do any endoscopic procedures.  Of note, patient had a recent C. difficile infection, but did not have a bowel movement initially when he came in.    Infectious disease was consulted and recommended monitoring off antibiotics at this point.   Patient now with acute delirium requiring as needed Haldol and started on Zyprexa at night.  11/30, dark red stools reported by bedside RN, low-grade temp 100.1, patient still delirious  Assessment & Plan   Principal Problem:   GI bleeding Active Problems:   Chronic combined systolic and diastolic heart failure ([name])   Hyperlipidemia   H/O coronary artery bypass surgery   Type II diabetes mellitus with renal manifestations ([name])   ESRD (end stage renal disease) ([name])   Pancytopenia ([name])   CAD (coronary artery disease)   Hypotension   Thrombocytopenia ([name])   H/O Clostridium difficile infection   Aorto-biiliac stent graft ([place])   PVD (peripheral vascular disease) ([place])   AAA (abdominal aortic aneurysm) without rupture   Diarrhea   Anemia of chronic disease   Chronic obstructive pulmonary disease ([place])   Weakness   Acute delirium   Comfort Care Status - as of this afternoon 12/1. --Appreciate palliative care consult --Continue comfort care per orders --Notify provider is signs of pain, distress or discomfort   Prior A/P:   Acute metabolic encephalopathy -likely related to anemia/bleeding, uremia, possibly infection and  hospital environment and elderly patient.  No focal neurologic deficits have been seen. --Rule out underlying causes: CT head, venous blood gas and ammonia levels --Stop finger sticks --Haldol as needed --Zyprexa at night --Comfort measures  GI bleeding with acute blood loss anemia superimposed on anemia of chronic disease - GI was consulted on admission, recommended against endoscopic procedures. Status post 2 units packed red cells since admission, most recently 11/30. 12/1: Hemoglobin 7.1>>7.6, still ongoing melena per RN. --Monitor hemoglobin, transfuse if less than 7 or with ongoing bleeding --Stop labs per comfort status  Hypoglycemia -likely due to poor p.o. intake.   CBG this afternoon 27, likely contributes to his current mental status. --Stop D5W and CBG's  Hypotension - BP fairly stable on midodrine, currently not able to tolerate PO meds. --Stop midodrine, no pressors  Pancytopenia -peripheral smear unremarkable.  On Epogen for anemia.  Hep C negative.  No further labs.  End-stage renal disease -on hemodialysis T/Th/S. -- Nephrology following  Acute on Chronic systolic CHF: Increaesd O2 needs 11/30-12/1, with pulmonary congestion on CXR. Echo on 09/30/2017 showed EF of 35-40% with grade 1 diastolic dysfunction. Echo this admission EF 35-40%, grade II diastolic dsfx, LV global hypokinesis, severely dilated LV internal cavity, severe bi-atrial dilation , moderately reduced RV systolic function, moderate L pleural effusion, severe TR, mild AR, mild-mod MR --Stop volume management w/ HD --Stop Daily weights  History of CAD stable - no active chest pain.  Monitor.yperlipidemia  -atorvastatin discontinued earlier this admission  Generalized weakness -PT had recommended rehab.   Elevated  TSH -TSH was 22 when checked early October.  T4 here mildly elevated, likely due to acute illness  Depression -chronic.  Continue home Lexapro  BPH -continue Flomax   Patient BMI: Body  mass index is 25.45 kg/m.   DVT prophylaxis:    Diet:  Diet Orders (From admission, onward)     Start     Ordered   01/16/2021 1256  Diet NPO time specified  Diet effective now        01/12/2021 1256              Code Status: DNR   Subjective 01/13/2021    Patient was transferred to stepdown unit last night for increasing O2 requirements.  Placed on Bipap, then CPAP based on blood gas.  Awake on CPAP when seen this AM.  Had more melena, few BM's since midnight.   Palliative care met with family today and decision was made to transition to comfort care.   Disposition Plan & Communication   Status is: Inpatient  Remains inpatient appropriate because: Severity of illness, requiring blood transfusion, worsening delirium and new fevers.   Consults, Procedures, Significant Events   Consultants:  Infectious disease Gastroenterology Nephrology  Procedures:   None  Antimicrobials:  Anti-infectives (From admission, onward)    None         Micro    Objective   Vitals:   07-Jan-2021 1100 01/29/2021 1200 01/09/2021 1300 02/02/2021 1800  BP: (!) 96/40 (!) 101/52 (!) 100/51   Pulse: 71 67 68 70  Resp: (!) 25 (!) 21 (!) 24 19  Temp:  99.8 F (37.7 C)    TempSrc:  Oral    SpO2: 100% 100% 100% 100%  Weight:      Height:        Intake/Output Summary (Last 24 hours) at 01/09/2021 2056 Last data filed at 07-Jan-2021 1722 Gross per 24 hour  Intake 812.62 ml  Output --  Net 812.62 ml   Filed Weights   01/04/21 1430 01/04/21 1815 02/01/2021 0103  Weight: 71.1 kg 71.5 kg 73.7 kg    Physical Exam: exam limited by patient's mental status  General exam: awake, alert, no acute distress HEENT: Wearing CPAP mask, hearing grossly normal  Respiratory system: bibasilar crackles, no wheezes or rhonchi were heard, normal respiratory effort, on CPAP with 100% spO1 Cardiovascular system: normal S1/S2, RRR, no pedal edema.   Gastrointestinal system: soft, non-tender,non-distended, no  guarding or rebound tenderness. Central nervous system: exam limited pt unable to follow commands, moves all extremities Extremities: mittens on b/l hands, no cyanosis, no edema   Labs   Data Reviewed: I have personally reviewed following labs and imaging studies  CBC: Recent Labs  Lab 12/31/20 0927 01/01/21 1523 01/02/21 0344 01/03/21 0505 01/04/21 0348 01/05/21 0619 01/05/21 1631 01/05/21 2325 02/01/2021 0522  WBC 3.6* 3.7* 4.8 4.7 5.1 6.3  --   --  7.6  NEUTROABS 2.7 2.7 3.6 3.5 3.9  --   --   --   --   HGB 7.9* 8.3* 7.7* 7.7* 7.6* 7.1* 8.0* 7.9* 7.6*  HCT 24.3* 25.3* 23.7* 23.5* 23.4* 22.2* 24.2* 24.5* 23.6*  MCV 96.8 98.4 97.1 96.7 96.7 97.4  --   --  95.2  PLT 55* 56* 59* 60* 70* 62*  --   --  66*   Basic Metabolic Panel: Recent Labs  Lab 12/31/20 0927 01/01/21 1523 01/02/21 0344 01/03/21 0505 01/04/21 0348 01/05/21 0619 01/05/21 2325 01/23/2021 0522  NA 134*   < >  133* 132* 132* 137  --  137  K 3.7   < > 3.5 3.6 3.8 4.2  --  4.6  CL 97*   < > 98 98 96* 102  --  103  CO2 32   < > _0 --  31  GLUCOSE 105*   < > 114* 88 100* 39* 61* 93  BUN 17   < > 11 18 24* 25*  --  40*  CREATININE 3.44*   < > 3.06* 4.32* 5.49* 4.05*  --  5.18*  CALCIUM 7.7*   < > 7.6* 7.5* 7.6* 7.7*  --  7.9*  PHOS 1.7*  --  1.2* 1.4* 1.7* 1.9*  --   --    < > = values in this interval not displayed.   GFR: Estimated Creatinine Clearance: 10.1 mL/min (A) (by C-G formula based on SCr of 5.18 mg/dL (H)). Liver Function Tests: Recent Labs  Lab 01/01/21 1523 01/02/21 0344 01/03/21 0505 01/04/21 0348 01/05/21 0619 January 22, 2021 0522  AST 34  --   --   --   --  162*  ALT 13  --   --   --   --  39  ALKPHOS 108  --   --   --   --  86  BILITOT 1.5*  --   --   --   --  1.2  PROT 5.7*  --   --   --   --  5.8*  ALBUMIN 2.1* 1.8* 2.0* 2.0* 2.1* 2.1*   No results for input(s): LIPASE, AMYLASE in the last 168 hours. Recent Labs  Lab 01/05/21 1025  AMMONIA 27   Coagulation Profile: No  results for input(s): INR, PROTIME in the last 168 hours. Cardiac Enzymes: No results for input(s): CKTOTAL, CKMB, CKMBINDEX, TROPONINI in the last 168 hours. BNP (last 3 results) No results for input(s): PROBNP in the last 8760 hours. HbA1C: No results for input(s): HGBA1C in the last 72 hours. CBG: Recent Labs  Lab 01/22/2021 0024 01/22/2021 0102 01/22/2021 0650 Jan 22, 2021 0810 01/22/2021 1123  GLUCAP 117* 76 92 87 94   Lipid Profile: No results for input(s): CHOL, HDL, LDLCALC, TRIG, CHOLHDL, LDLDIRECT in the last 72 hours. Thyroid Function Tests: Recent Labs    01/04/21 0348 01/05/21 0619  TSH  --  36.162*  FREET4 1.20*  --    Anemia Panel: No results for input(s): VITAMINB12, FOLATE, FERRITIN, TIBC, IRON, RETICCTPCT in the last 72 hours. Sepsis Labs: Recent Labs  Lab 01/01/21 1523 01/02/21 0344 2021/01/22 1119  PROCALCITON <0.10 0.10  --   LATICACIDVEN  --   --  1.2    Recent Results (from the past 240 hour(s))  Resp Panel by RT-PCR (Flu A&B, Covid) Nasopharyngeal Swab     Status: None   Collection Time: 12/13/2020 11:08 PM   Specimen: Nasopharyngeal Swab; Nasopharyngeal(NP) swabs in vial transport medium  Result Value Ref Range Status   SARS Coronavirus 2 by RT PCR NEGATIVE NEGATIVE Final    Comment: (NOTE) SARS-CoV-2 target nucleic acids are NOT DETECTED.  The SARS-CoV-2 RNA is generally detectable in upper respiratory specimens during the acute phase of infection. The lowest concentration of SARS-CoV-2 viral copies this assay can detect is 138 copies/mL. A negative result does not preclude SARS-Cov-2 infection and should not be used as the sole basis for treatment or other patient management decisions. A negative result may occur with  improper specimen collection/handling, submission of specimen other than nasopharyngeal swab, presence  of viral mutation(s) within the areas targeted by this assay, and inadequate number of viral copies(<138 copies/mL). A negative  result must be combined with clinical observations, patient history, and epidemiological information. The expected result is Negative.  Fact Sheet for Patients:  EntrepreneurPulse.com.au  Fact Sheet for Healthcare Providers:  IncredibleEmployment.be  This test is no t yet approved or cleared by the [place] FDA and  has been authorized for detection and/or diagnosis of SARS-CoV-2 by FDA under an Emergency Use Authorization (EUA). This EUA will remain  in effect (meaning this test can be used) for the duration of the COVID-19 declaration under Section 564(b)(1) of the Act, 21 U.S.C.section 360bbb-3(b)(1), unless the authorization is terminated  or revoked sooner.       Influenza A by PCR NEGATIVE NEGATIVE Final   Influenza B by PCR NEGATIVE NEGATIVE Final    Comment: (NOTE) The Xpert Xpress SARS-CoV-2/FLU/RSV plus assay is intended as an aid in the diagnosis of influenza from Nasopharyngeal swab specimens and should not be used as a sole basis for treatment. Nasal washings and aspirates are unacceptable for Xpert Xpress SARS-CoV-2/FLU/RSV testing.  Fact Sheet for Patients: EntrepreneurPulse.com.au  Fact Sheet for Healthcare Providers: IncredibleEmployment.be  This test is not yet approved or cleared by the [place] FDA and has been authorized for detection and/or diagnosis of SARS-CoV-2 by FDA under an Emergency Use Authorization (EUA). This EUA will remain in effect (meaning this test can be used) for the duration of the COVID-19 declaration under Section 564(b)(1) of the Act, 21 U.S.C. section 360bbb-3(b)(1), unless the authorization is terminated or revoked.  Performed at [facility], [address] [place], [place] [address]   Blood Culture (routine x 2)     Status: None   Collection Time: 12/14/2020 11:09 PM   Specimen: BLOOD LEFT FOREARM  Result Value Ref Range Status    Specimen Description BLOOD LEFT FOREARM  Final   Special Requests   Final    BOTTLES DRAWN AEROBIC AND ANAEROBIC Blood Culture adequate volume   Culture   Final    NO GROWTH 5 DAYS Performed at [facility], [address] [place], [place] [address]    Report Status 01/02/2021 FINAL  Final  Blood Culture (routine x 2)     Status: None   Collection Time: 12/12/2020 11:09 PM   Specimen: BLOOD RIGHT FOREARM  Result Value Ref Range Status   Specimen Description BLOOD RIGHT FOREARM  Final   Special Requests   Final    BOTTLES DRAWN AEROBIC AND ANAEROBIC Blood Culture results may not be optimal due to an excessive volume of blood received in culture bottles   Culture   Final    NO GROWTH 5 DAYS Performed at [facility], [address] [place], [place] [address]    Report Status 01/02/2021 FINAL  Final      Imaging Studies   CT HEAD WO CONTRAST (5MM)  Result Date: 01/05/2021 CLINICAL DATA:  Delirium. Recent fall with head injury approximately 12/07/2020. EXAM: CT HEAD WITHOUT CONTRAST TECHNIQUE: Contiguous axial images were obtained from the base of the skull through the vertex without intravenous contrast. COMPARISON:  CT head 12/07/2020 FINDINGS: Brain: Mild atrophy. Mild to moderate white matter hypoattenuation bilaterally. Negative for acute infarct, hemorrhage, mass. No change from the prior study. Image quality degraded by mild motion. Vascular: Negative for hyperdense vessel Skull: Negative Sinuses/Orbits: Minimal mucosal edema paranasal sinuses. Bilateral cataract extraction. Resolving right frontal scalp hematoma since the prior study.  Other: Image quality degraded by mild motion. IMPRESSION: Atrophy and chronic microvascular ischemia. No acute intracranial abnormality Electronically Signed   By: Franchot Gallo M.D.   On: 01/05/2021 12:52   US Venous Img Lower Unilateral Left (DVT)  Result Date: 01/20/2021 CLINICAL DATA:  Left lower extremity swelling. EXAM:  LEFT LOWER EXTREMITY VENOUS DOPPLER ULTRASOUND TECHNIQUE: Gray-scale sonography with compression, as well as color and duplex ultrasound, were performed to evaluate the deep venous system(s) from the level of the common femoral vein through the popliteal and proximal calf veins. COMPARISON:  None. FINDINGS: VENOUS Normal compressibility of the common femoral, superficial femoral, and popliteal veins, as well as the visualized calf veins. Visualized portions of profunda femoral vein and great saphenous vein unremarkable. No filling defects to suggest DVT on grayscale or color Doppler imaging. Doppler waveforms show normal direction of venous flow, normal respiratory plasticity and response to augmentation. Limited views of the contralateral common femoral vein are unremarkable. OTHER None. Limitations: none IMPRESSION: Negative. Electronically Signed   By: Telford Nab M.D.   On: 01/20/2021 05:35   DG Chest Port 1 View  Result Date: 01/05/2021 CLINICAL DATA:  Fever weakness EXAM: PORTABLE CHEST 1 VIEW COMPARISON:  01/01/2021, 12/30/2020, 12/07/2020 FINDINGS: Cardiomegaly with vascular congestion and mild interstitial pulmonary edema. No consolidative airspace disease. Aortic atherosclerosis. No pneumothorax. IMPRESSION: Cardiomegaly with vascular congestion and interstitial pulmonary edema. No focal airspace disease. Electronically Signed   By: Donavan Foil M.D.   On: 01/05/2021 16:25     Medications   Scheduled Meds:   Continuous Infusions:  morphine 6 mg/hr (01-20-21 2004)       LOS: 8 days    Time spent: 30 minutes    Ezekiel Slocumb, DO [facility]  2021/01/20, 8:56 PM      If 7PM-7AM, please contact night-coverage. How to contact the [facility] Attending or Consulting provider [place] or covering provider during after hours [place], for this patient?    Check the care team in [facility] and look for a) attending/consulting [facility] provider listed and b) the [facility] team listed Log into  [email or web address] and use [place]'s universal password to access. If you do not have the password, please contact the hospital operator. Locate the [facility] provider you are looking for under [facility] and page to a number that you can be directly reached. If you still have difficulty reaching the provider, please page the [facility] (Director on Call) for the Hospitalists listed on amion for assistance.

## 2021-02-06 NOTE — Death Summary Note (Addendum)
DEATH SUMMARY   Patient Details  Name: Brent Glover MRN: [id number] DOB: 08-05-1935 WUJ:[id number], Brent Norton, DO  Admission/Discharge Information   Admit Date:  01-18-2021  Date of Death: Date of Death: Jan 27, 2021  Time of Death: Time of Death: 05-14-39  Length of Stay: 06-May-2022   Principle Cause of death:   GI bleeding with acute blood loss anemia    Hospital Diagnoses: Principal Problem:   GI bleeding Active Problems:   Chronic combined systolic and diastolic heart failure ([name])   Hyperlipidemia   H/O coronary artery bypass surgery   Type II diabetes mellitus with renal manifestations ([name])   ESRD (end stage renal disease) ([name])   Pancytopenia ([name])   CAD (coronary artery disease)   Hypotension   Thrombocytopenia ([name])   H/O Clostridium difficile infection   Aorto-biiliac stent graft ([name])   PVD (peripheral vascular disease) ([place])   AAA (abdominal aortic aneurysm) without rupture   Diarrhea   Anemia of chronic disease   Chronic obstructive pulmonary disease ([name])   Weakness   Acute delirium   Hospital Course: 86 year old male with past medical history of ESRD on hemodialysis, hyperlipidemia, hypertension, COPD, chronic diastolic heart failure and BPH was sent to the hospital with generalized weakness.  He was found to have a GI bleed with acute blood loss anemia and underlying anemia of chronic disease.  GI was consulted, did not want to do any endoscopic procedures.  Of note, patient had a recent C. difficile infection, but did not have a bowel movement initially when he came in.     Infectious disease was consulted and recommended monitoring off antibiotics at this point.   Patient now with acute delirium requiring as needed Haldol and started on Zyprexa at night.   11/30, dark red stools reported by bedside RN, low-grade temp 100.1, patient still delirious  01/28/23: palliative care meeting with family and decision was made to transition patient to comfort care  measures.    Comfort Care Status - as afternoon 01/28/2023. --Appreciate palliative care consult --Comfort care per orders, morphine infusion was started Patient subsequently expired at 05/14/39, pronounced by bedside RN's.      Prior A/P:     Acute metabolic encephalopathy -likely related to anemia/bleeding, uremia, possibly infection and hospital environment and elderly patient.  No focal neurologic deficits have been seen. --Rule out underlying causes: CT head, venous blood gas and ammonia levels --Stop finger sticks --Haldol as needed --Zyprexa at night --Comfort measures   GI bleeding with acute blood loss anemia superimposed on anemia of chronic disease - GI was consulted on admission, recommended against endoscopic procedures. Sigmoid diverticulosis without evidence of diverticulitis Status post 2 units packed red cells since admission, most recently 12/3010-22-24: Hemoglobin 7.1>>7.6, still ongoing melena per RN. --Monitor hemoglobin, transfuse if less than 7 or with ongoing bleeding --Stop labs per comfort status   Hypoglycemia -likely due to poor p.o. intake.   CBG this afternoon 27, likely contributes to his current mental status. --Stop D5W and CBG's  Hypotension - BP fairly stable on midodrine, currently not able to tolerate PO meds. --Stop midodrine, no pressors   Pancytopenia -peripheral smear unremarkable.  On Epogen for anemia.  Hep C negative.  No further labs.  End-stage renal disease -on hemodialysis T/Th/S. -- Nephrology following   Acute on Chronic combined systolic / diastolic CHF: Increaesd O2 needs 11/30-12/1, with pulmonary congestion on CXR. Echo on 09/30/2017 showed EF of 35-40% with grade 1 diastolic dysfunction. Echo this admission  EF 35-40%, grade II diastolic dsfx, LV global hypokinesis, severely dilated LV internal cavity, severe bi-atrial dilation , moderately reduced RV systolic function, moderate L pleural effusion, severe TR, mild AR, mild-mod  MR --Stop volume management w/ HD --Stop Daily weights   History of CAD stable - no active chest pain.  Monitor.yperlipidemia  -atorvastatin discontinued earlier this admission  Generalized weakness -PT had recommended rehab.   Elevated TSH -TSH was 22 when checked early October.  T4 here mildly elevated, likely due to acute illness   Depression -chronic.  Continue home Lexapro   BPH -continue Flomax      Procedures: Dialysis  Consultations: Palliative Care, Infectious Disease, GI, Nephrology  The results of significant diagnostics from this hospitalization (including imaging, microbiology, ancillary and laboratory) are listed below for reference.   Significant Diagnostic Studies: DG Chest 2 View  Result Date: 01/01/2021 CLINICAL DATA:  Generalized weakness, anemia, shortness of breath, CHF EXAM: CHEST - 2 VIEW COMPARISON:  12/30/2020 FINDINGS: Enlargement of cardiac silhouette post CABG. Pulmonary vascular congestion. Scattered interstitial infiltrates most likely representing pulmonary edema and failure. Small bibasilar pleural effusions and basilar atelectasis. Bones demineralized. No pneumothorax. IMPRESSION: CHF with small bibasilar pleural effusions and basilar atelectasis. Electronically Signed   By: Lavonia Dana M.D.   On: 01/01/2021 15:56   CT HEAD WO CONTRAST (5MM)  Result Date: 01/05/2021 CLINICAL DATA:  Delirium. Recent fall with head injury approximately 12/07/2020. EXAM: CT HEAD WITHOUT CONTRAST TECHNIQUE: Contiguous axial images were obtained from the base of the skull through the vertex without intravenous contrast. COMPARISON:  CT head 12/07/2020 FINDINGS: Brain: Mild atrophy. Mild to moderate white matter hypoattenuation bilaterally. Negative for acute infarct, hemorrhage, mass. No change from the prior study. Image quality degraded by mild motion. Vascular: Negative for hyperdense vessel Skull: Negative Sinuses/Orbits: Minimal mucosal edema paranasal sinuses.  Bilateral cataract extraction. Resolving right frontal scalp hematoma since the prior study. Other: Image quality degraded by mild motion. IMPRESSION: Atrophy and chronic microvascular ischemia. No acute intracranial abnormality Electronically Signed   By: Franchot Gallo M.D.   On: 01/05/2021 12:52   US Venous Img Lower Unilateral Left (DVT)  Result Date: 2021/02/04 CLINICAL DATA:  Left lower extremity swelling. EXAM: LEFT LOWER EXTREMITY VENOUS DOPPLER ULTRASOUND TECHNIQUE: Gray-scale sonography with compression, as well as color and duplex ultrasound, were performed to evaluate the deep venous system(s) from the level of the common femoral vein through the popliteal and proximal calf veins. COMPARISON:  None. FINDINGS: VENOUS Normal compressibility of the common femoral, superficial femoral, and popliteal veins, as well as the visualized calf veins. Visualized portions of profunda femoral vein and great saphenous vein unremarkable. No filling defects to suggest DVT on grayscale or color Doppler imaging. Doppler waveforms show normal direction of venous flow, normal respiratory plasticity and response to augmentation. Limited views of the contralateral common femoral vein are unremarkable. OTHER None. Limitations: none IMPRESSION: Negative. Electronically Signed   By: Telford Nab M.D.   On: 02/04/2021 05:35   DG Chest Port 1 View  Result Date: 01/05/2021 CLINICAL DATA:  Fever weakness EXAM: PORTABLE CHEST 1 VIEW COMPARISON:  01/01/2021, 12/30/2020, 12/07/2020 FINDINGS: Cardiomegaly with vascular congestion and mild interstitial pulmonary edema. No consolidative airspace disease. Aortic atherosclerosis. No pneumothorax. IMPRESSION: Cardiomegaly with vascular congestion and interstitial pulmonary edema. No focal airspace disease. Electronically Signed   By: Donavan Foil M.D.   On: 01/05/2021 16:25   DG Chest Port 1 View  Result Date: 12/30/2020 CLINICAL DATA:  Shortness of breath.  EXAM: PORTABLE  CHEST 1 VIEW COMPARISON:  12/11/2020 FINDINGS: Cardiomegaly and median sternotomy changes again noted. Pulmonary vascular congestion is noted with mild interstitial opacities suggesting mild interstitial edema. No pneumothorax or large pleural effusion identified. No acute bony abnormalities are present. IMPRESSION: Cardiomegaly with pulmonary vascular congestion and probable mild interstitial edema. Electronically Signed   By: Margarette Canada M.D.   On: 12/30/2020 16:40   DG Chest Port 1 View  Result Date: 12/25/2020 CLINICAL DATA:  Possible sepsis EXAM: PORTABLE CHEST 1 VIEW COMPARISON:  12/07/2020 FINDINGS: Cardiac shadow is mildly enlarged but stable. Aortic calcifications are noted. Postsurgical changes are seen. Lungs are well aerated bilaterally. Skin fold is noted over the left chest. Vascular congestion is noted without interstitial edema. Filtrate is seen. IMPRESSION: Increasing vascular congestion without interstitial edema. Electronically Signed   By: Inez Catalina M.D.   On: 12/21/2020 23:13   DG Abd Portable 1V  Result Date: 12/31/2020 CLINICAL DATA:  86 year old male with abdominal distension. EXAM: PORTABLE ABDOMEN - 1 VIEW COMPARISON:  None. FINDINGS: Nonspecific, nonobstructive bowel gas pattern. Calcified gallstones in the right upper quadrant. Aorto bi-iliac endograft in place. Atherosclerotic calcifications are noted. Age-indeterminate posterolateral left ninth and tenth rib fractures with mild displacement. IMPRESSION: 1. Nonspecific, nonobstructive bowel gas pattern. 2. Age-indeterminate but likely chronic posterolateral left ninth and tenth rib fractures with mild displacement. 3. Cholelithiasis. Electronically Signed   By: Ruthann Cancer M.D.   On: 12/31/2020 14:55   ECHOCARDIOGRAM COMPLETE  Result Date: 01/01/2021    ECHOCARDIOGRAM REPORT   Patient Name:   JUANJOSE MOJICA Cuadra Date of Exam: 01/01/2021 Medical Rec #:  [id number]        Height:       67.0 in Accession #:    [id number]        Weight:       165.3 lb Date of Birth:  May 24, 1935       BSA:          1.865 m Patient Age:    12 years         BP:           114/47 mmHg Patient Gender: M                HR:           64 bpm. Exam Location:  [facility] Procedure: 2D Echo, Cardiac Doppler and Color Doppler Indications:     CHF I50.9  History:         Patient has prior history of Echocardiogram examinations, most                  recent 09/30/2017. COPD; Risk Factors:Diabetes and Hypertension.  Sonographer:     Sherrie Sport Referring Phys:  [id number] [place] Diagnosing Phys: Neoma Laming  Sonographer Comments: Suboptimal apical window. IMPRESSIONS  1. Left ventricular ejection fraction, by estimation, is 35 to 40%. The left ventricle has moderate to severely decreased function. The left ventricle demonstrates global hypokinesis. The left ventricular internal cavity size was severely dilated. There  is mild left ventricular hypertrophy. Left ventricular diastolic parameters are consistent with Grade II diastolic dysfunction (pseudonormalization).  2. Right ventricular systolic function is moderately reduced. The right ventricular size is moderately enlarged. Moderately increased right ventricular wall thickness.  3. Left atrial size was severely dilated.  4. Right atrial size was severely dilated.  5. The pericardial effusion is circumferential. Moderate pleural effusion in the left lateral region.  6. The mitral  valve is grossly normal. Mild to moderate mitral valve regurgitation.  7. Tricuspid valve regurgitation is severe.  8. The aortic valve is calcified. Aortic valve regurgitation is mild. Aortic valve sclerosis is present, with no evidence of aortic valve stenosis. FINDINGS  Left Ventricle: Left ventricular ejection fraction, by estimation, is 35 to 40%. The left ventricle has moderate to severely decreased function. The left ventricle demonstrates global hypokinesis. The left ventricular internal cavity size was severely dilated. There is mild  left ventricular hypertrophy. Left ventricular diastolic parameters are consistent with Grade II diastolic dysfunction (pseudonormalization). Right Ventricle: The right ventricular size is moderately enlarged. Moderately increased right ventricular wall thickness. Right ventricular systolic function is moderately reduced. Left Atrium: Left atrial size was severely dilated. Right Atrium: Right atrial size was severely dilated. Pericardium: Trivial pericardial effusion is present. The pericardial effusion is circumferential. Mitral Valve: The mitral valve is grossly normal. Mild to moderate mitral valve regurgitation. MV peak gradient, 9.1 mmHg. The mean mitral valve gradient is 3.0 mmHg. Tricuspid Valve: The tricuspid valve is grossly normal. Tricuspid valve regurgitation is severe. Aortic Valve: The aortic valve is calcified. Aortic valve regurgitation is mild. Aortic valve sclerosis is present, with no evidence of aortic valve stenosis. Aortic valve mean gradient measures 5.0 mmHg. Aortic valve peak gradient measures 10.2 mmHg. Aortic valve area, by VTI measures 1.39 cm. Pulmonic Valve: The pulmonic valve was grossly normal. Pulmonic valve regurgitation is mild. Aorta: The aortic root, ascending aorta and aortic arch are all structurally normal, with no evidence of dilitation or obstruction. IAS/Shunts: The interatrial septum was not well visualized. Additional Comments: There is a moderate pleural effusion in the left lateral region.  LEFT VENTRICLE PLAX 2D LVIDd:         5.70 cm   Diastology LVIDs:         4.10 cm   LV e' medial:    5.00 cm/s LV PW:         1.20 cm   LV E/e' medial:  22.8 LV IVS:        0.90 cm   LV e' lateral:   5.77 cm/s LVOT diam:     2.00 cm   LV E/e' lateral: 19.8 LV SV:         53 LV SV Index:   29 LVOT Area:     3.14 cm  RIGHT VENTRICLE            IVC RV Basal diam:  5.40 cm    IVC diam: 2.60 cm RV S prime:     7.51 cm/s TAPSE (M-mode): 3.8 cm LEFT ATRIUM              Index        RIGHT  ATRIUM           Index LA diam:        5.10 cm  2.73 cm/m   RA Area:     27.00 cm LA Vol (A2C):   129.0 ml 69.16 ml/m  RA Volume:   92.30 ml  49.49 ml/m LA Vol (A4C):   109.0 ml 58.44 ml/m LA Biplane Vol: 121.0 ml 64.87 ml/m  AORTIC VALVE                     PULMONIC VALVE AV Area (Vmax):    1.42 cm      PV Vmax:        0.78 m/s AV Area (Vmean):   1.37 cm  PV Vmean:       51.300 cm/s AV Area (VTI):     1.39 cm      PV VTI:         0.194 m AV Vmax:           159.33 cm/s   PV Peak grad:   2.4 mmHg AV Vmean:          105.667 cm/s  PV Mean grad:   1.0 mmHg AV VTI:            0.385 m       RVOT Peak grad: 5 mmHg AV Peak Grad:      10.2 mmHg AV Mean Grad:      5.0 mmHg LVOT Vmax:         71.80 cm/s LVOT Vmean:        46.100 cm/s LVOT VTI:          0.170 m LVOT/AV VTI ratio: 0.44  AORTA Ao Root diam: 3.60 cm MITRAL VALVE                TRICUSPID VALVE MV Area (PHT): 4.12 cm     TR Peak grad:   65.0 mmHg MV Area VTI:   1.34 cm     TR Vmax:        403.00 cm/s MV Peak grad:  9.1 mmHg MV Mean grad:  3.0 mmHg     SHUNTS MV Vmax:       1.51 m/s     Systemic VTI:  0.17 m MV Vmean:      79.8 cm/s    Systemic Diam: 2.00 cm MV Decel Time: 184 msec     Pulmonic VTI:  0.288 m MV E velocity: 114.00 cm/s MV A velocity: 42.40 cm/s MV E/A ratio:  2.69 Shaukat Khan Electronically signed by Neoma Laming Signature Date/Time: 01/01/2021/12:20:15 PM    Final    CT Angio Abd/Pel w/ and/or w/o  Result Date: 12/31/2020 CLINICAL DATA:  Post abdominal aortic aneurysm stenting; end-stage renal disease, missed dialysis recently, third-spacing EXAM: CTA ABDOMEN AND PELVIS WITHOUT AND WITH CONTRAST TECHNIQUE: Multidetector CT imaging of the abdomen and pelvis was performed using the standard protocol during bolus administration of intravenous contrast. Multiplanar reconstructed images and MIPs were obtained and reviewed to evaluate the vascular anatomy. CONTRAST:  133mL OMNIPAQUE IOHEXOL 350 MG/ML SOLN IV COMPARISON:  09/29/2017  FINDINGS: VASCULAR Aorta: Scattered atherosclerotic calcifications aorta. Aneurysmal dilatation distal abdominal aorta 5.4 x 5.4 cm series 12 image 32 previously 5.3 x 5.3 cm, extending 9.0 cm length previously 8.6 cm. Post placement of aortic stent with iliac extending from proximal margin of origin of SMA to distal common iliac arteries. Expected enhancement of aortic lumen and the lumens of the stents. No abnormal enhancement within native aneurysm to suggest endoleak. Celiac: Significant atherosclerotic plaque at origin of celiac artery, with mild narrowing. SMA: Significant plaque at proximal SMA extending throughout SMA with moderate narrowing. The endoluminal stent extends across the orifice of the SMA though normal enhancement of the SMA lumen is seen. Significant plaque at the origins of atrophic renal arteries bilaterally. Renals: Significant plaque at the origins of atrophic renal arteries bilaterally. IMA: Occluded at origin.  Reconstitutes by collaterals. Inflow: Scattered atherosclerotic plaques within the common, internal common external iliac arteries bilaterally. LEFT common iliac artery and stent 2.0 cm diameter. RIGHT common iliac artery and stent 2.0 cm diameter. Proximal Outflow: Atherosclerotic plaque formation at common femoral and proximal superficial femoral arteries, suspect  significant narrowing at the proximal SFA bilaterally. Veins: Patent Review of the MIP images confirms the above findings. NON-VASCULAR Lower chest: BILATERAL pleural effusions in compressive atelectasis of the adjacent lower lobes. Interstitial thickening question edema at the lower lungs bilaterally. Enlargement of cardiac chambers. Hepatobiliary: Gallbladder contracted containing multiple calcified stones. Liver unremarkable. Pancreas: Normal appearance Spleen: Normal appearance Adrenals/Urinary Tract: Adrenal glands normal appearance. Markedly atrophic BILATERAL kidneys which contain tiny calculi versus renal  vascular calcifications. No mass or hydronephrosis. Ureters and decompressed bladder unremarkable. Stomach/Bowel: Sigmoid diverticulosis without evidence of diverticulitis. Stomach and bowel loops normal appearance. Appendix not identified. Lymphatic: No adenopathy Reproductive: Unremarkable prostate gland and seminal vesicles Other: Scattered ascites, low attenuation. No free air. RIGHT inguinal hernia containing ascites and minimal fat. Musculoskeletal: Unremarkable IMPRESSION: Post stenting of abdominal aortic aneurysm with 5.5 cm diameter infrarenal abdominal aortic aneurysm, previously 5.3 x 5.3 cm, without evidence of endoleak. Stent extends across the SMA orifice though the SMA enhances normally; position of stent is unchanged from prior exam. Significant plaque at the origins of the celiac, superior mesenteric and atrophic renal arteries with moderate SMA stenosis and mild celiac artery stenosis. Cholelithiasis. Marked BILATERAL renal atrophy. Significant ascites likely due to known end-stage renal disease and missing dialysis. Sigmoid diverticulosis without evidence of diverticulitis. RIGHT inguinal hernia containing ascites and minimal fat. Enlargement of cardiac chambers with small BILATERAL pleural effusions and minimal pulmonary edema at the lower lungs. Aortic Atherosclerosis (ICD10-I70.0). Electronically Signed   By: Lavonia Dana M.D.   On: 12/31/2020 18:55    Microbiology: No results found for this or any previous visit (from the past 240 hour(s)).  Time spent: 20 minutes  Signed: Ezekiel Slocumb 01/21/2021

## 2021-02-06 NOTE — Progress Notes (Signed)
Hypoglycemic Event  CBG: 57  Treatment: D50 50 mL (25 gm)  Symptoms: Nervous/irritable  Follow-up CBG: Time:0024 CBG Result:117  Possible Reasons for Event: Inadequate meal intake  Comments/MD notified: Sharion Settler NP    Christophe Louis

## 2021-02-06 NOTE — Progress Notes (Signed)
Hypoglycemic Event  CBG: 48  Treatment: D50 50 mL (25 gm)  Symptoms: Nervous/irritable  Follow-up CBG: Time:2157 CBG Result:96  Possible Reasons for Event: Inadequate meal intake  Comments/MD notified: Sharion Settler NP    Christophe Louis

## 2021-02-06 NOTE — Progress Notes (Addendum)
PT Cancellation Note  Patient Details Name: Brent Glover MRN: [id number] DOB: 06/24/1935   Cancelled Treatment:    Reason Eval/Treat Not Completed: Medical issues which prohibited therapy.  Pt currently on BiPAP with recently declining medical issues.  Pt placed on comfort care only at this time with PT orders being discontinued.  Please send new referral if PT evaluation is warranted at later date.  D/C at this time from Physical Therapy.   Gwenlyn Saran, PT, DPT 28-Jan-2021, 11:21 AM

## 2021-02-06 NOTE — Progress Notes (Signed)
Yasmin Soriano RN and Perfecto Kingdom RN declared time of death 05/07/39. Morphine infusion stopped and disconnected.

## 2021-02-06 NOTE — Progress Notes (Addendum)
I assessed the patient after he received his initial doses of morphine and Ativan.  Patient still appears in distress and agitated.  Nurse endorses he slept for a short amount of time but has remained agitated throughout.  I shared with the patient that his body is beginning to shut down and that he is actively dying.  I shared we will do everything possible to keep him comfortable, dry, clean, and minimize if symptoms of pain, shortness of breath, anxiety, and agitation.  Patient moved his head towards me by a few inches but did not respond or open his eyes.  I spoke with Pam over the phone again.  She shared that she has left a message for the patient's brother Jeneen Rinks and that her daughter is coming from [facility] visit.  However, Pam made it very clear that we are to move forward with comfort care measures.  She shared she does not want to prolong his suffering by waiting on family members to come and visit.  I shared that we will ensure he is as comfortable as possible for removing the BiPAP.  Morphine 2 mg every hour as needed ordered.  Advised nurse that should this amount not be effective and minimizing his symptoms and we will move to a morphine drip.  Also suggested that we use Haldol should the use of morphine layered on top of the already given Ativan is not effective.  Nursing confirmed understanding.  Afternoon update: 4:55pm - Received message from nursing that patient was agitated, grimacing, and moaning despite receiving a 2 mg dose of morphine approximately 1430.  Patient converted to a morphine drip with basal rate plus bolus available from infusion PRN.  Haldol as needed ordered for agitation.  Nursing made aware of converting morphine to GTT.  [place] Ilsa Iha, FNP-BC Palliative Medicine Team Team Phone # [phone]   NO CHARGE

## 2021-02-06 NOTE — Progress Notes (Addendum)
Pt will be transferring to room 138, Daughter Pam made aware.   04-30-39 Pt time of death 2139/04/30, pronounced by Corine Shelter, RN and Perfecto Kingdom, RN. Daughter Pam called and updated.

## 2021-02-06 NOTE — Progress Notes (Signed)
Pt transitioned to Comfort care this afternoon. Pt's 2 daughters, wife, and grand daughter visited this evening and they took his belongings home with them. Pt appears calmer and more comfortable after Morphine drip was started, currently titrated tup to 4mg /hr.

## 2021-02-06 NOTE — Consult Note (Signed)
NAME:  Brent Glover, MRN:  [id number], DOB:  07-23-1935, LOS: 8 ADMISSION DATE:  12/31/2020, CONSULTATION DATE:  02-01-2021 REFERRING MD:  Dr. Arbutus Ped, CHIEF COMPLAINT:  Acute Hypoxic Respiratory Failure, Hypotension  Brief Pt Description / Synopsis:  86 y.o. Male with PMH of ESRD on HD and chronic Diastolic CHF admitted with Acute blood loss anemia due to Acute GI Bleed requiring multiple blood transfusions.  GI has advised against any endoscopic procedures. Hospital course complicated by acute delirium and Acute Hypoxic Respiratory Failure in the setting of volume overload/Acute Decompensated CHF requiring BiPAP.    History of Present Illness:  Brent Glover is a 86 year old male with a past medical history significant for chronic diastolic CHF, CAD s/p CABG, Aortic Aneurysm s/p stent,  ESRD on hemodialysis, COPD, type 2 diabetes mellitus, hypertension, hyperlipidemia, and BPH who presented to [facility] ED on 12/12/2020 due to complaints of generalized weakness, frequent falls, and melanotic stools.  He also reported associated chills, diarrhea, and malaise and fatigue.  He denied nausea, vomiting, chest pain, palpitations, cough, shortness of breath, dysuria.  Of note, the patient has reported intermittent black tarry stools since October 2022.  He had dental work done in October for which he was given amoxicillin.  Presented initially on 11/11/20 to the ED with weakness, SBP 70's and black tarry stools.  Found to have Hgb 7.1 of which he was transfused pRBC.  GI saw him, C. diff stool test revealed  cdiff positive antigen and PCR but negative toxin.  He was given PO Vancomycin, but no further interventions were deemed necessary.  He was discharged home on 11/15/20.    He then returned to ED on 11/22 generalized weakness and black stools.   ED course: Initial vital signs: BP 91/38, temp. 97.3, pulse 79 Significant labs: Potassium 3, chloride 95, bicarb 33, glucose 68, BUN 22, creatinine 2.89,  calcium 7.5, albumin 2, lactic acid 2.0, hemoglobin 7.7, hematocrit 24 SARS-CoV-2 and influenza PCR negative Stool Hemoccult positive Imaging: Chest x-ray>>Increasing vascular congestion without interstitial edema. Medications given: IV Protonix bolus and infusion, 500 cc IV normal saline bolus, 1 unit pRBC's  He was admitted by the hospitalist for further work-up and treatment of acute blood loss anemia due to acute GI bleed and Hypotension.  Please see [place] section below for full detailed hospital course.  Pertinent  Medical History  ESRD on hemodialysis Chronic diastolic CHF Coronary Artery Disease s/p CABG Aortic Aneurysm s/p stent COPD Smoker Hypertension Hyperlipidemia Diabetes mellitus BPH  Micro Data:  12/27/2020: SARS-CoV-2 and influenza PCR>> negative 12/18/2020: Blood culture x2>> negative February 01, 2021: Blood culture x2>>  Antimicrobials:  N/A  Significant Diagnostic Tests:  12/31/2020: CTA abdomen pelvis>>IMPRESSION: Post stenting of abdominal aortic aneurysm with 5.5 cm diameter infrarenal abdominal aortic aneurysm, previously 5.3 x 5.3 cm, without evidence of endoleak. Stent extends across the SMA orifice though the SMA enhances normally; position of stent is unchanged from prior exam. Significant plaque at the origins of the celiac, superior mesenteric and atrophic renal arteries with moderate SMA stenosis and mild celiac artery stenosis. Cholelithiasis. Marked BILATERAL renal atrophy. Significant ascites likely due to known end-stage renal disease and missing dialysis. Sigmoid diverticulosis without evidence of diverticulitis. RIGHT inguinal hernia containing ascites and minimal fat. Enlargement of cardiac chambers with small BILATERAL pleural effusions and minimal pulmonary edema at the lower lungs. Aortic Atherosclerosis 01/05/2021: CT head without contrast>>Atrophy and chronic microvascular ischemia. No acute  intracranial Abnormality 01/05/2021: Chest x-ray>>Cardiomegaly with vascular congestion and interstitial pulmonary  edema. No focal airspace disease. Feb 02, 2021: Venous ultrasound left lower extremity>>Negative.  Significant Hospital Events:  01/02/2021: Presented to ED with weakness and GI bleeding.  Admitted by Hospitalist. Given 1 unit pRBC's 12/29/2020: Seen by GI. No plan for endoscopic procedures. Nephrology consulted for ESRD.  12/30/2020: Received HD, required 1L NS bolus during HD for hypotension. 12/31/2020: Seen by ID for recent C. Diff infection. Likely colonized, holding off on ABX for now unless develops recurrent diarrhea.  CTA Abdomen/Pelvis obtained to r/o ongoing bleeding ~ no active bleed 01/01/2021: Received HD, diet started 01/03/2021: 2 BM's 01/04/2021: Hgb 7.6, Given 1 unit pRBC's with HD session. Acute delirium 01/05/2021: Continued Delirium. Episode of dark red stool. Given 1 unit pRBC's. Palliative Care consulted Feb 02, 2021: Transferred to stepdown unit in the early morning due to multiple episodes of hypoglycemia, and increasing FiO2 requirements requiring BiPAP.  PCCM consulted due to high risk of intubation.  Palliative care to have meeting with family today for [place] discussion, considering hospice  Interim History / Subjective:  -Patient transferred to stepdown unit earlier this morning due to multiple episodes of hypoglycemia, and increasing FiO2 requirements ~ was placed on CPAP -Patient is high risk for intubation, PCCM consulted -Palliative care to meet with family today for further goals of care discussion ~ considering Hospice -Systolic blood pressures in the 90's-100's, however maps remain greater than 65, currently on midodrine and 10 mg 3 times daily -Patient is due for hemodialysis today -Hemoglobin 7.6 (7.9 previously) today, s/p 1 unit pRBC's yesterday  Objective   Blood pressure (!) 105/45, pulse 73, temperature 99.9 F (37.7 C), temperature source  Axillary, resp. rate (!) 25, height 5\' 7"  (1.702 m), weight 73.7 kg, SpO2 100 %.    FiO2 (%):  [30 %-40 %] 30 %   Intake/Output Summary (Last 24 hours) at 02-Feb-2021 1050 Last data filed at February 02, 2021 0900 Gross per 24 hour  Intake 1253.81 ml  Output 0 ml  Net 1253.81 ml   Filed Weights   01/04/21 1430 01/04/21 1815 2021-02-02 0103  Weight: 71.1 kg 71.5 kg 73.7 kg    Examination: General: Acute on chronically ill-appearing male, laying in bed, on BiPAP, intermittently restless, currently in no acute distress HENT: Atraumatic, normocephalic, neck supple, no JVD Lungs: Coarse breath sounds bilaterally, no wheezing, BiPAP assisted, tachypneic, mild accessory muscle use Cardiovascular: Tachycardia, regular rhythm, S1-S2, Abdomen: Soft, nontender, nondistended, no guarding rebound tenderness, bowel sounds positive x4 Extremities: No deformities, no edema, generalized weakness Neuro: Lethargic, arouses easily to voice, moves all 4 extremities to command (no focal deficits just generalized weakness), pupils PERRLA GU: Deferred Skin: Limited exam:  Warm and dry.  Scattered ecchymosis throughout  Resolved Hospital Problem list     Assessment & Plan:   Acute Hypoxic Respiratory Failure in the setting of Volume Overload/ Acute on Chronic CHF PMHx: COPD -Supplemental O2 as needed to maintain O2 sats 88 to 92% -BiPAP, wean as tolerated -HIGH RISK FOR INTUBATION -Follow intermittent chest x-ray and ABG as needed -Bronchodilators -Volume removal with dialysis as tolerated -Ensure adequate pulmonary hygiene  Acute on Chronic combined Systolic & Diastolic CHF (LVEF 35 to 09%, grade 2 diastolic dysfunction) Hypotension, suspect Hypovolemic due to GI bleed + poor PO intake PMHx: CAD s/p CABG -Continuous cardiac monitoring -Maintain MAP >65 -Cautious IV Fluids given CHF & ESRD -Transfusions as indicated -Vasopressors as needed to maintain MAP goal -Continue Midodrine 10 mg TID -Trend  lactic acid until normalized (2.0 ~ 1.6) -Trend HS Troponin until peaked -Echocardiogram  01/01/21: LVEF 35 to 54%, grade 2 diastolic dysfunction, moderately reduced right ventricular systolic function, severe tricuspid regurgitation  Acute GI Bleed Acute Blood Loss Anemia Thrombocytopenia, suspect due to infection -Monitor for s/sx of bleeding -Transfusions as indicated -IV Protonix 40 mg BID -GI following, appreciate input ~ recommends against Endoscopic procedures at this time -CTA Abdomen & Pelvis on 11/25: IMPRESSION: Post stenting of abdominal aortic aneurysm with 5.5 cm diameter infrarenal abdominal aortic aneurysm, previously 5.3 x 5.3 cm, without evidence of endoleak. Stent extends across the SMA orifice though the SMA enhances normally; position of stent is unchanged from prior exam. Significant plaque at the origins of the celiac, superior mesenteric and atrophic renal arteries with moderate SMA stenosis and mild celiac artery stenosis. Cholelithiasis. Marked BILATERAL renal atrophy. Significant ascites likely due to known end-stage renal disease and missing dialysis. Sigmoid diverticulosis without evidence of diverticulitis. RIGHT inguinal hernia containing ascites and minimal fat. Enlargement of cardiac chambers with small BILATERAL pleural effusions and minimal pulmonary edema at the lower lungs. Aortic Atherosclerosis   Acute Blood Loss Anemia Thrombocytopenia, suspect due to infection -Monitor for S/Sx of bleeding -Trend CBC -SCD's for VTE Prophylaxis  -Transfuse for Hgb <7 -Continue Epogen -Peripheral smear unremarkable -Consider Hematology consult  Recent C. Difficile Infection in October 2022 (ID suspects colonized with C. Diff) -Monitor fever curve -Trend WBC's & Procalcitonin -Follow cultures as above -ID consulted ~ recommended monitoring off ABX since no diarrhea (is okay to give another course of Vancomycin if diarrhea develops)  ESRD on  Hemodialysis -Monitor I&O's / urinary output -Follow BMP -Ensure adequate renal perfusion -Avoid nephrotoxic agents as able -Replace electrolytes as indicated -Nephrology following, appreciate input -Hemodialysis as per Nephrology  Acute Metabolic Encephalopathy/Delirium -Provide supportive care -Promote normal sleep/wake cycle -Avoid sedating medications as able -CT Head on 11/30 negative for acute intracranial abnormality  Hypoglycemia, suspect due to poor PO intake -CBG's q4h -Continue D10 infusion -Follow ICU Hypo/Hyperglycemia protocol     Best Practice (right click and "Reselect all SmartList Selections" daily)   Diet/type: Regular consistency (see orders) DVT prophylaxis: SCD GI prophylaxis: PPI Lines: N/A Foley:  N/A Code Status:  full code Last date of multidisciplinary goals of care discussion [Jan 31, 2021]  Pt is critically ill with multiorgan failure, prognosis is extremely guarded.  High risk for Cardiac arrest and death.  Given his multiple co-morbidities superimposed on acute illness, recommend DNR/DNI status.  Palliative Care is following, to meet with family this afternoon.  Labs   CBC: Recent Labs  Lab 12/31/20 0927 01/01/21 1523 01/02/21 0344 01/03/21 0505 01/04/21 0348 01/05/21 0619 01/05/21 1631 01/05/21 2325 01/31/21 0522  WBC 3.6* 3.7* 4.8 4.7 5.1 6.3  --   --  7.6  NEUTROABS 2.7 2.7 3.6 3.5 3.9  --   --   --   --   HGB 7.9* 8.3* 7.7* 7.7* 7.6* 7.1* 8.0* 7.9* 7.6*  HCT 24.3* 25.3* 23.7* 23.5* 23.4* 22.2* 24.2* 24.5* 23.6*  MCV 96.8 98.4 97.1 96.7 96.7 97.4  --   --  95.2  PLT 55* 56* 59* 60* 70* 62*  --   --  66*    Basic Metabolic Panel: Recent Labs  Lab 12/31/20 0927 01/01/21 1523 01/02/21 0344 01/03/21 0505 01/04/21 0348 01/05/21 0619 01/05/21 2325 31-Jan-2021 0522  NA 134*   < > 133* 132* 132* 137  --  137  K 3.7   < > 3.5 3.6 3.8 4.2  --  4.6  CL 97*   < > 98  98 96* 102  --  103  CO2 32   < > 31 29 29 29   --  31  GLUCOSE 105*    < > 114* 88 100* 39* 61* 93  BUN 17   < > 11 18 24* 25*  --  40*  CREATININE 3.44*   < > 3.06* 4.32* 5.49* 4.05*  --  5.18*  CALCIUM 7.7*   < > 7.6* 7.5* 7.6* 7.7*  --  7.9*  PHOS 1.7*  --  1.2* 1.4* 1.7* 1.9*  --   --    < > = values in this interval not displayed.   GFR: Estimated Creatinine Clearance: 10.1 mL/min (A) (by C-G formula based on SCr of 5.18 mg/dL (H)). Recent Labs  Lab 01/01/21 1523 01/02/21 0344 01/03/21 0505 01/04/21 0348 01/05/21 0619 02-01-2021 0522  PROCALCITON <0.10 0.10  --   --   --   --   WBC 3.7* 4.8 4.7 5.1 6.3 7.6    Liver Function Tests: Recent Labs  Lab 01/01/21 1523 01/02/21 0344 01/03/21 0505 01/04/21 0348 01/05/21 0619 02-01-2021 0522  AST 34  --   --   --   --  162*  ALT 13  --   --   --   --  39  ALKPHOS 108  --   --   --   --  86  BILITOT 1.5*  --   --   --   --  1.2  PROT 5.7*  --   --   --   --  5.8*  ALBUMIN 2.1* 1.8* 2.0* 2.0* 2.1* 2.1*   No results for input(s): LIPASE, AMYLASE in the last 168 hours. Recent Labs  Lab 01/05/21 1025  AMMONIA 27    ABG    Component Value Date/Time   PHART 7.57 (H) 02/01/21 0202   PCO2ART 36 02-01-2021 0202   PO2ART 139 (H) February 01, 2021 0202   HCO3 33.0 (H) February 01, 2021 0202   O2SAT 99.4 02/01/2021 0202     Coagulation Profile: No results for input(s): INR, PROTIME in the last 168 hours.  Cardiac Enzymes: No results for input(s): CKTOTAL, CKMB, CKMBINDEX, TROPONINI in the last 168 hours.  HbA1C: Hgb A1c MFr Bld  Date/Time Value Ref Range Status  11/11/2020 03:39 PM 5.2 4.8 - 5.6 % Final    Comment:    (NOTE)         Prediabetes: 5.7 - 6.4         Diabetes: >6.4         Glycemic control for adults with diabetes: <7.0     CBG: Recent Labs  Lab 01/05/21 2351 2021-02-01 0024 01-Feb-2021 0102 01-Feb-2021 0650 01-Feb-2021 0810  GLUCAP 57* 117* 76 92 87    Review of Systems:   Unable to assess due to AMS and BiPAP   Past Medical History:  He,  has a past medical history of BPH  (benign prostatic hyperplasia), Chronic diastolic heart failure ([place]), COPD (chronic obstructive pulmonary disease) ([place]), Diabetes mellitus without complication ([place]), ESRD (end stage renal disease) ([place]), Hyperlipidemia, and Hypertension.   Surgical History:   Past Surgical History:  Procedure Laterality Date   ABDOMINAL AORTIC ENDOVASCULAR STENT GRAFT     CYSTOSCOPY     KNEE ARTHROPLASTY       Social History:   reports that he has been smoking cigarettes. He has a 60.00 pack-year smoking history. He has never used smokeless tobacco. He reports that he does not drink alcohol and does not  use drugs.   Family History:  His family history includes Diabetes Mellitus II in his sister; Diabetes type II in his brother.   Allergies Allergies  Allergen Reactions   Montelukast Shortness Of Breath   Trazodone Other (See Comments)    "it hypes me up instead of putting me to sleep"   Hydrocodone Nausea And Vomiting     Home Medications  Prior to Admission medications   Medication Sig Start Date End Date Taking? Authorizing Provider  acetaminophen (TYLENOL) 500 MG tablet Take 1,000 mg by mouth at bedtime.   Yes [provider]  aspirin 81 MG tablet Take 81 mg by mouth daily.   Yes [provider]  atorvastatin (LIPITOR) 80 MG tablet Take 80 mg by mouth daily. 08/17/17  Yes [provider]  b complex-vitamin c-folic acid (NEPHRO-VITE) 0.8 MG TABS tablet Take 1 tablet by mouth daily.   Yes [provider]  calcium acetate (PHOSLO) 667 MG capsule Take 1,334 mg by mouth 3 (three) times daily with meals.   Yes [provider]  cetirizine (ZYRTEC) 10 MG tablet Take 10 mg by mouth at bedtime.   Yes [provider]  escitalopram (LEXAPRO) 20 MG tablet Take 20 mg by mouth daily.    Yes [provider]  fluticasone (FLONASE) 50 MCG/ACT nasal spray Place 2 sprays into both nostrils daily as needed for allergies.   Yes [provider]   furosemide (LASIX) 40 MG tablet Take 80 mg by mouth daily.   Yes [provider]  lisinopril (ZESTRIL) 5 MG tablet Take 5 mg by mouth daily.   Yes [provider]  magnesium oxide (MAG-OX) 400 MG tablet Take 400 mg by mouth daily.   Yes [provider]  Melatonin 5 MG TABS Take 10 mg by mouth at bedtime as needed (sleep).   Yes [provider]  metoprolol succinate (TOPROL-XL) 25 MG 24 hr tablet Take 25 mg by mouth daily.   Yes [provider]  midodrine (PROAMATINE) 5 MG tablet Take 10 mg by mouth daily.   Yes [provider]  rOPINIRole (REQUIP) 0.5 MG tablet Take 1.5 mg by mouth at bedtime.   Yes [provider]  tamsulosin (FLOMAX) 0.4 MG CAPS Take 0.8 mg by mouth daily.   Yes [provider]     Critical care time: 55 minutes     Darel Hong, AGACNP-BC [facility] epic messenger for cross cover needs If after hours, please call E-link

## 2021-02-06 NOTE — Progress Notes (Signed)
Patient arrived to room 138 at 2138. Patient was nonverbal, unresponsive, comfort care, and a DNR. Morphine infusing at 6mg /hr. Nurse tech, Miny, Nurse Perfecto Kingdom, and Nurse Corine Shelter in room when patient passed at 2141. Morphine infusion stopped, disconnected and wasted with Concepcion Elk  RN and Perfecto Kingdom RN, 48mL remained and wasted into stericycle in medication room. Drucilla Chalet, daughter of patient, was called and arrived to be with patient. All information necessary collected and entered. Funeral home information collected and entered. Post-mortem care performed, bed placement contacted, and [facility] contacted. Patient is ready to be transported to the morgue. Family aware of patient's passing. MD notified of time of death and spoke with family as well.

## 2021-02-06 NOTE — Progress Notes (Addendum)
Palliative Care Progress Note, Assessment & Plan   Patient Name: Brent Glover       Date: January 14, 2021 DOB: 04/14/1935  Age: 86 y.o. MRN#: [id number] Attending Physician: Ezekiel Slocumb, DO Primary Care Physician: Christen Bame, DO Admit Date: 12/17/2020  Reason for Consultation/Follow-up: Establishing goals of care  Subjective: Patient is laying in bed in acute distress.  He is fidgeting and agitated.  He is wearing the CPAP as well as mittens.  He continues to pull at his legs and reach for his facemask.  No family at bedside at this time.  HPI: 86 y.o. male  with past medical history of ESRD (on HD T/TH/S), HLD, HTN, COPD, chronic diastolic HF, BPH, anemia of chronic disease, and recent C. difficile infection admitted on 12/25/2020 with weakness.  Patient also experienced a GI bleed.  GI was consulted. As per GI note, no plan for any endoscopic procedures. Given frailty, recurrent hospital admissions, and medical co-morbidities GI recommends palliative care.  Patient has become increasingly confused.  CT of head does not reveal etiology of encephalopathy.  Ammonia levels are normal.  Patient became increasingly agitated with congested breathing.  Patient was transferred to ICU for use of BiPAP.  Patient is currently on CPAP.  Confusion still present.   Palliative medicine was consulted to discuss goals of care.  Summary of counseling/coordination of care: After reviewing the chart, epic notes, labs, and imaging, I met with the patient's wife and 2 daughters to discuss goals of care.  I started to outlined aggressive medical treatment versus comfort pathway but daughter Jeannene Patella interrupt me and saying that they at all already agreed to move forward with DNR and comfort measures.    All family  members present were in agreement to move forward with a DNR and comfort care.  I briefly outlined that a DNR does not mean do not care but rather allows a natural death.  I outlined that comfort care would focus solely on symptom management.  This would mean stopping all lab work, test, and further escalation of care.  Again all family confirmed that they were all in agreement to move forward with comfort care.  Patient has 1 brother Jeneen Rinks with whom daughter Jeannene Patella is going to speak with.  Pam made it clear that whether or not Jeneen Rinks visit should not impede moving forward with ensuring the patient's comfort.  We agreed that I would give small doses of morphine and Ativan now.  I would round on the patient in about an hour.  Assuming patient is comfortable we will move forward with using medication for symptom management and removing the BiPAP.  Also educated the family that the patient may not immediately passed once we have removed BiPAP and other prolonging measures.  I reviewed that should he stabilize medically that we would consider a hospice inpatient facility evaluation.  All family present was in agreement with this.  Therapeutic silence and active listening provided to allow space and time for family to share their thoughts and emotions involved with end-of-life care for the family member.    Wife [name] shared she is in survival mode and just does not want him to suffer.  Daughter Jeannene Patella says she is her father's favorite child and that he always listens to her.  She also is a Marine scientist and understands that he has reached end-of-life and that allowing a natural death is what is in his best interest.   Daughter [name] says she is never been the favorite and she does not have a close relationship with him.   Daughter Jeannene Patella will be point of contact moving forward.  Code Status: DNR  Prognosis: Hours - Days  Discharge Planning: Anticipated Hospital Death  Recommendations/Plan: CODE STATUS changed to  DNR. Comfort measures to be put in place once brother Jeneen Rinks visits or we get word from family that is not going to visit. Spiritual care consult offered and family declined at this time. Unrestrictive visitor access  Care plan was discussed with patient, patient's wife, patient's two daughters, Dr. Mortimer Fries, Dr. Arbutus Ped, [facility] SW Maurice Small, nursing  Physical Exam Constitutional:      General: He is in acute distress.     Appearance: He is ill-appearing and toxic-appearing.  HENT:     Head: Normocephalic and atraumatic.     Mouth/Throat:     Mouth: Mucous membranes are dry.  Cardiovascular:     Rate and Rhythm: Normal rate.  Pulmonary:     Effort: Respiratory distress present.  Abdominal:     Palpations: Abdomen is soft.  Musculoskeletal:     Comments: Generalized weakness  Skin:    General: Skin is warm and dry.               Total Time 70 minutes  Greater than 50%  of this time was spent counseling and coordinating care related to the above assessment and plan.  Thank you for allowing the Palliative Medicine Team to assist in the care of this patient.  Brent Ilsa Iha, FNP-BC Palliative Medicine Team Team Phone # [phone]

## 2021-02-06 DEATH — deceased

## 2022-05-25 IMAGING — US US EXTREM LOW VENOUS*L*
1 series · 14 of 24 positions shown · non-contrast
Comparison: None.

CLINICAL DATA: Left lower extremity swelling.

EXAM:
LEFT LOWER EXTREMITY VENOUS DOPPLER ULTRASOUND
TECHNIQUE: Gray-scale sonography with compression, as well as color and duplex
ultrasound, were performed to evaluate the deep venous system(s)
from the level of the common femoral vein through the popliteal and
proximal calf veins.

[Series 1: us venous img lower uni left (dvt) · portal-venous · 14 of 34 slices shown]
[im 1/34]
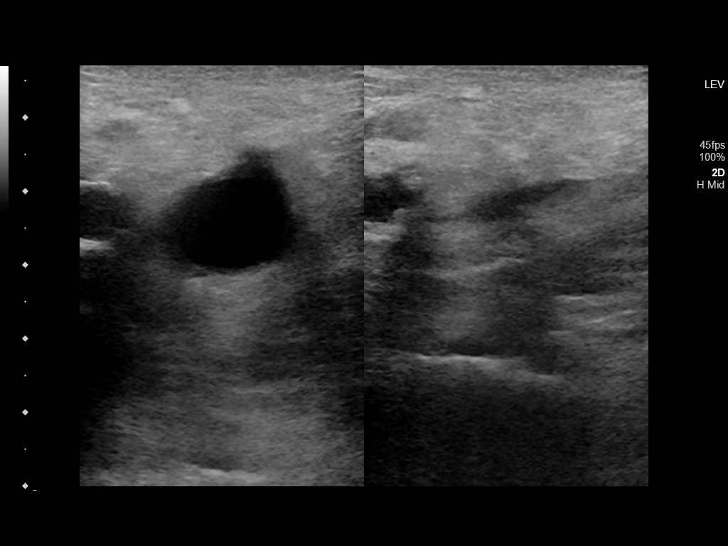
[im 3/34]
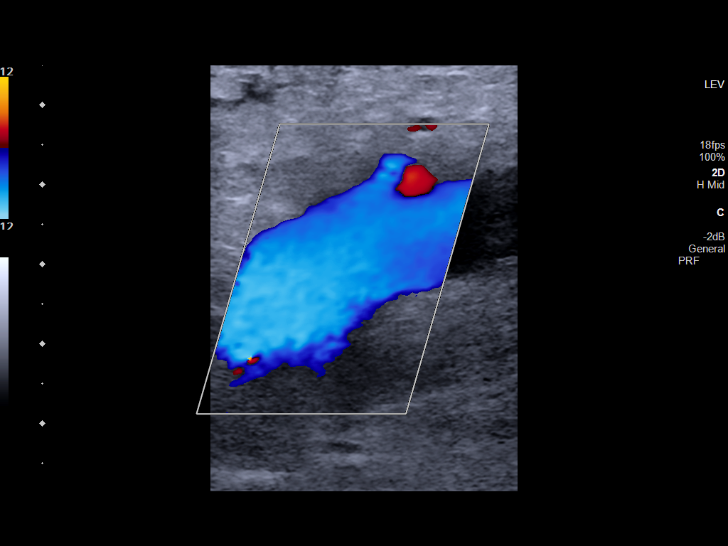
[im 6/34]
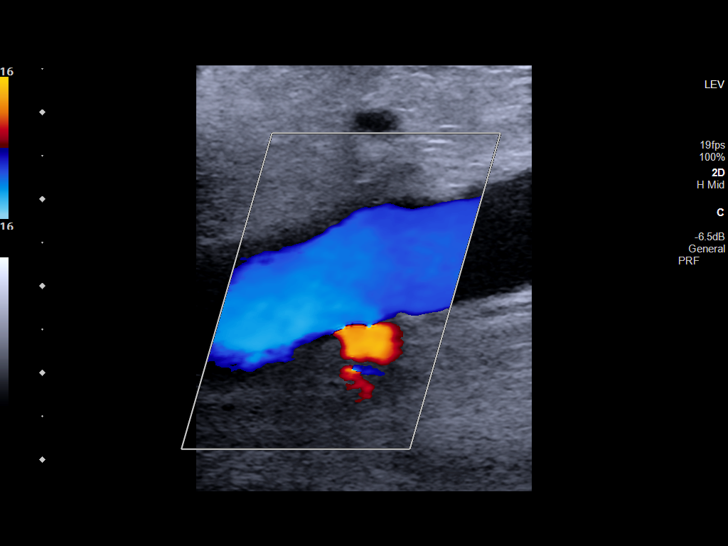
[im 9/34]
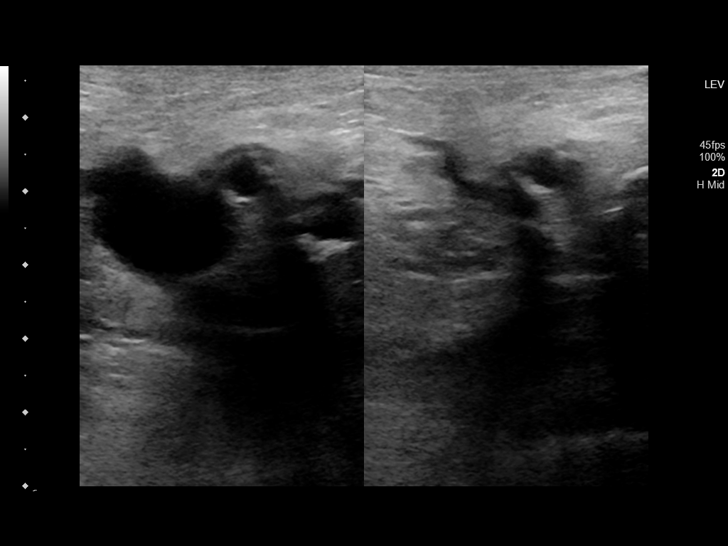
[im 11/34]
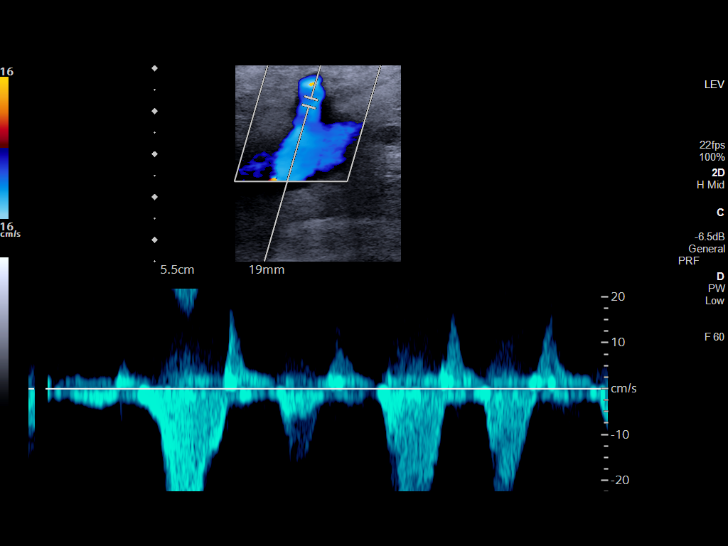
[im 13/34]
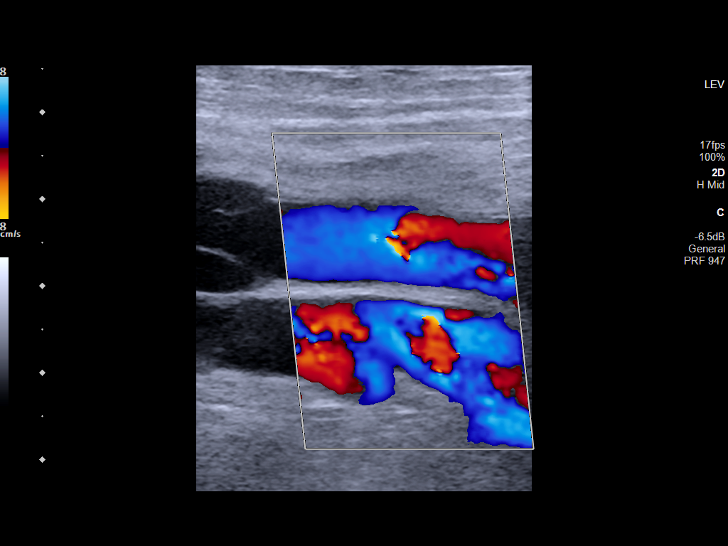
[im 16/34]
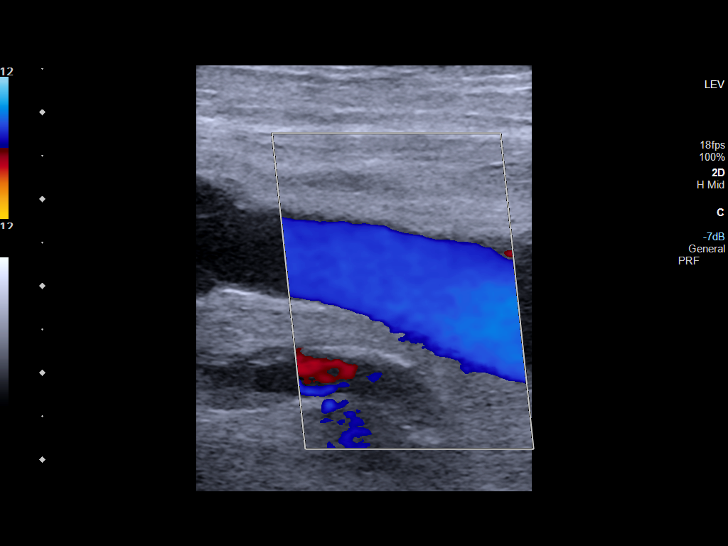
[im 18/34]
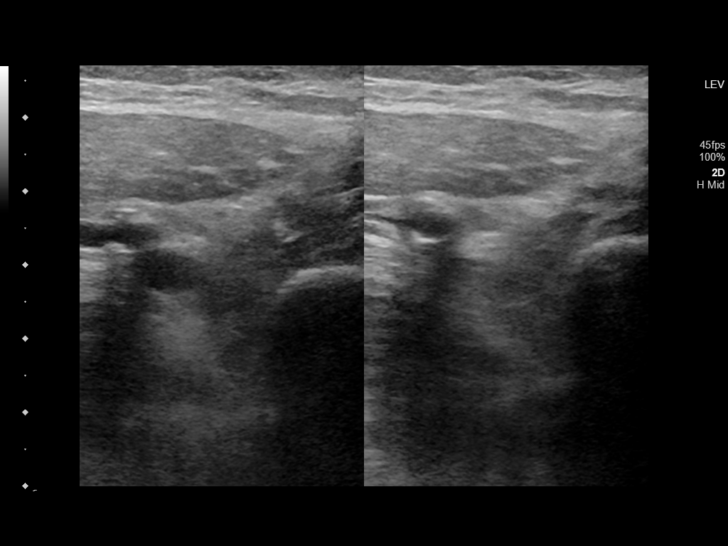
[im 21/34]
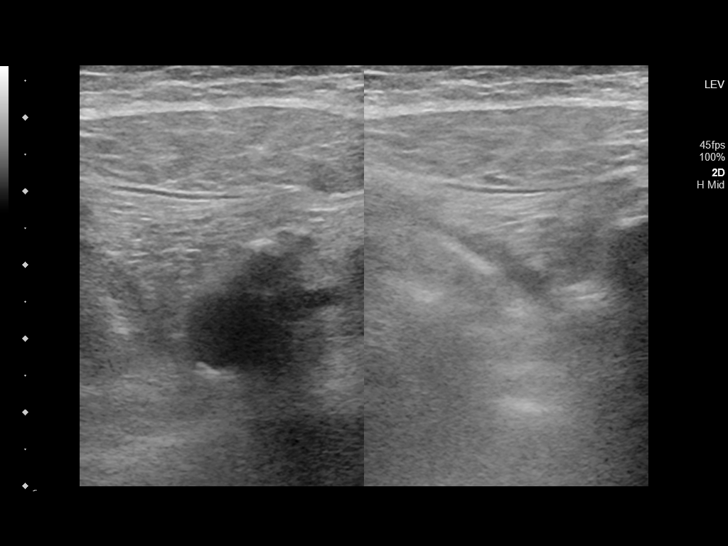
[im 23/34]
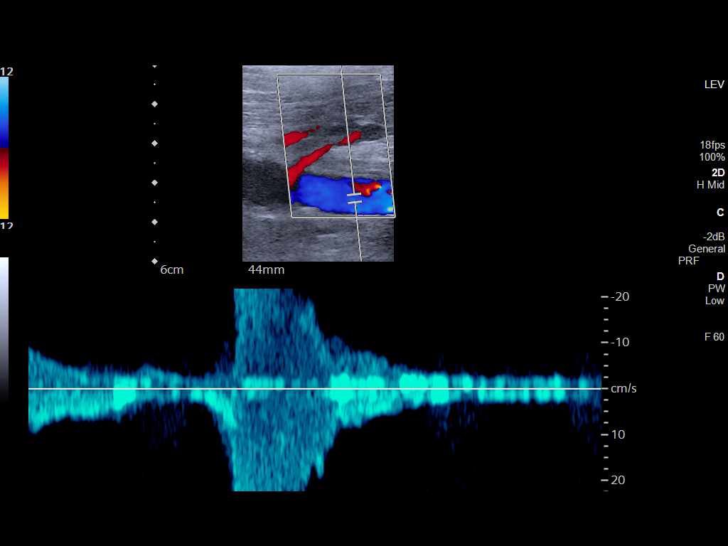
[im 26/34]
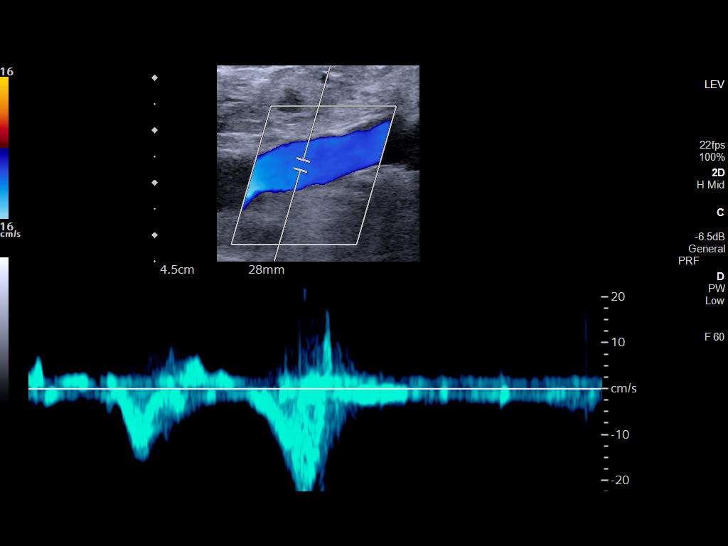
[im 28/34]
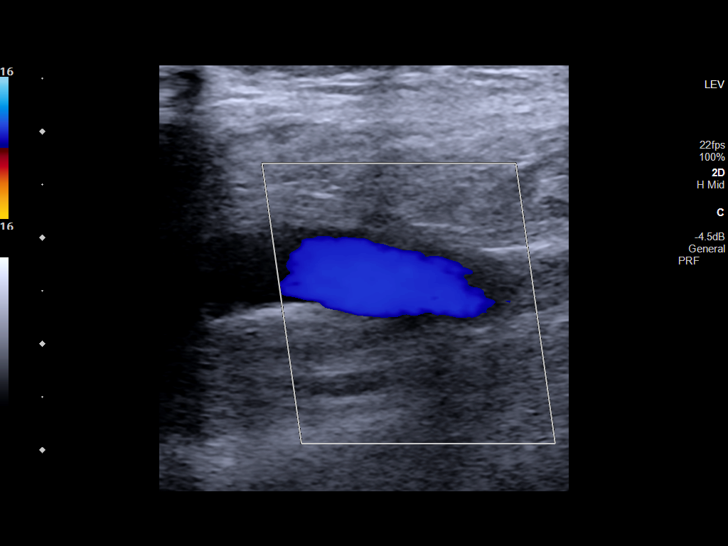
[im 31/34]
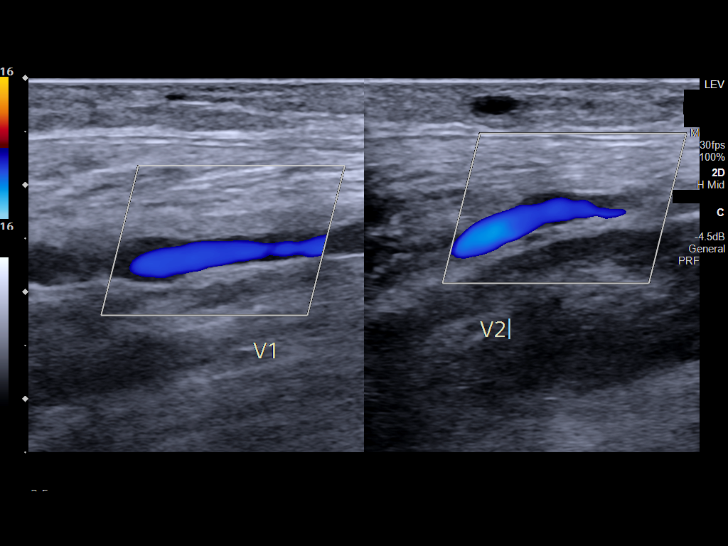
[im 34/34]
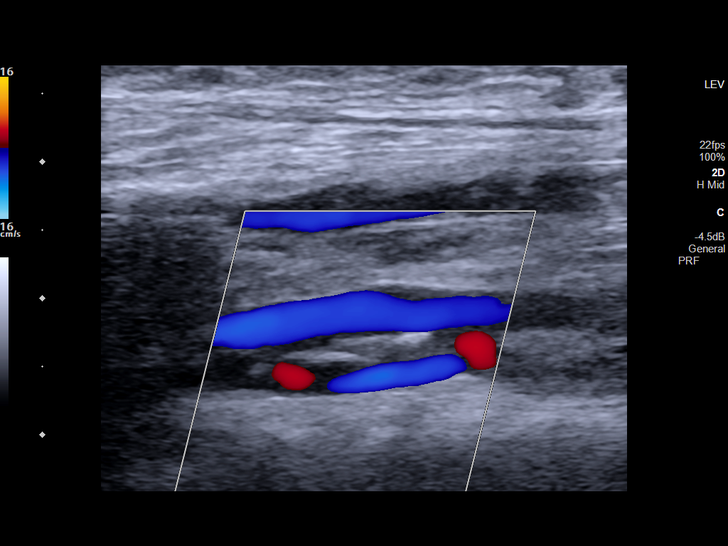

[14 of 24 positions shown; findings below may reference images not displayed]

FINDINGS: VENOUS

Normal compressibility of the common femoral, superficial femoral,
and popliteal veins, as well as the visualized calf veins.
Visualized portions of profunda femoral vein and great saphenous
vein unremarkable. No filling defects to suggest DVT on grayscale or
color Doppler imaging. Doppler waveforms show normal direction of
venous flow, normal respiratory plasticity and response to
augmentation.

Limited views of the contralateral common femoral vein are
unremarkable.

OTHER

None.

Limitations: none
IMPRESSION: Negative.
